# Patient Record
Sex: Female | Born: 1950
Health system: Southern US, Community
[De-identification: ages and names within clinical notes are randomized; demographics above are authoritative.]

## PROBLEM LIST (undated history)

## (undated) DIAGNOSIS — R0902 Hypoxemia: Secondary | ICD-10-CM

## (undated) DIAGNOSIS — L509 Urticaria, unspecified: Secondary | ICD-10-CM

## (undated) DIAGNOSIS — J45909 Unspecified asthma, uncomplicated: Secondary | ICD-10-CM

## (undated) DIAGNOSIS — K22710 Barrett's esophagus with low grade dysplasia: Secondary | ICD-10-CM

## (undated) DIAGNOSIS — E782 Mixed hyperlipidemia: Secondary | ICD-10-CM

## (undated) DIAGNOSIS — F418 Other specified anxiety disorders: Secondary | ICD-10-CM

## (undated) DIAGNOSIS — G4733 Obstructive sleep apnea (adult) (pediatric): Secondary | ICD-10-CM

## (undated) DIAGNOSIS — L650 Telogen effluvium: Secondary | ICD-10-CM

## (undated) DIAGNOSIS — J449 Chronic obstructive pulmonary disease, unspecified: Secondary | ICD-10-CM

## (undated) DIAGNOSIS — G4762 Sleep related leg cramps: Secondary | ICD-10-CM

## (undated) DIAGNOSIS — M8000XA Age-related osteoporosis with current pathological fracture, unspecified site, initial encounter for fracture: Secondary | ICD-10-CM

## (undated) DIAGNOSIS — F332 Major depressive disorder, recurrent severe without psychotic features: Secondary | ICD-10-CM

## (undated) DIAGNOSIS — I2699 Other pulmonary embolism without acute cor pulmonale: Secondary | ICD-10-CM

## (undated) DIAGNOSIS — K219 Gastro-esophageal reflux disease without esophagitis: Secondary | ICD-10-CM

## (undated) DIAGNOSIS — H269 Unspecified cataract: Secondary | ICD-10-CM

## (undated) DIAGNOSIS — E6609 Other obesity due to excess calories: Secondary | ICD-10-CM

## (undated) DIAGNOSIS — T7840XA Allergy, unspecified, initial encounter: Secondary | ICD-10-CM

## (undated) DIAGNOSIS — Z90710 Acquired absence of both cervix and uterus: Secondary | ICD-10-CM

## (undated) HISTORY — DX: Other specified anxiety disorders: F41.8

## (undated) HISTORY — DX: Allergy, unspecified, initial encounter: T78.40XA

## (undated) HISTORY — DX: Age-related osteoporosis with current pathological fracture, unspecified site, initial encounter for fracture: M80.00XA

## (undated) HISTORY — DX: Sleep related leg cramps: G47.62

## (undated) HISTORY — DX: Other obesity due to excess calories: E66.09

## (undated) HISTORY — DX: Barrett's esophagus with low grade dysplasia: K22.710

## (undated) HISTORY — DX: Mixed hyperlipidemia: E78.2

## (undated) HISTORY — DX: Gastro-esophageal reflux disease without esophagitis: K21.9

## (undated) HISTORY — DX: Chronic obstructive pulmonary disease, unspecified: J44.9

## (undated) HISTORY — PX: OTHER SURGICAL HISTORY: SHX169

## (undated) HISTORY — PX: EYE SURGERY: SHX253

## (undated) HISTORY — DX: Unspecified cataract: H26.9

## (undated) HISTORY — DX: Urticaria, unspecified: L50.9

## (undated) HISTORY — DX: Obstructive sleep apnea (adult) (pediatric): G47.33

## (undated) HISTORY — PX: SPINE SURGERY: SHX786

## (undated) HISTORY — DX: Telogen effluvium: L65.0

## (undated) HISTORY — DX: Other pulmonary embolism without acute cor pulmonale: I26.99

## (undated) HISTORY — DX: Major depressive disorder, recurrent severe without psychotic features: F33.2

## (undated) HISTORY — DX: Unspecified asthma, uncomplicated: J45.909

## (undated) HISTORY — DX: Acquired absence of both cervix and uterus: Z90.710

## (undated) HISTORY — DX: Hypoxemia: R09.02

---

## 1978-03-16 HISTORY — PX: ABDOMINAL HYSTERECTOMY: SHX81

## 1998-09-20 ENCOUNTER — Emergency Department (HOSPITAL_COMMUNITY): Admission: EM | Admit: 1998-09-20 | Discharge: 1998-09-20 | Payer: Self-pay | Admitting: Emergency Medicine

## 1998-09-22 ENCOUNTER — Emergency Department (HOSPITAL_COMMUNITY): Admission: EM | Admit: 1998-09-22 | Discharge: 1998-09-22 | Payer: Self-pay | Admitting: Emergency Medicine

## 1998-09-23 ENCOUNTER — Emergency Department (HOSPITAL_COMMUNITY): Admission: EM | Admit: 1998-09-23 | Discharge: 1998-09-23 | Payer: Self-pay | Admitting: Emergency Medicine

## 1998-09-24 ENCOUNTER — Emergency Department (HOSPITAL_COMMUNITY): Admission: EM | Admit: 1998-09-24 | Discharge: 1998-09-24 | Payer: Self-pay | Admitting: Emergency Medicine

## 2000-01-26 ENCOUNTER — Encounter: Payer: Self-pay | Admitting: Orthopedic Surgery

## 2000-01-26 ENCOUNTER — Ambulatory Visit (HOSPITAL_COMMUNITY): Admission: RE | Admit: 2000-01-26 | Discharge: 2000-01-26 | Payer: Self-pay | Admitting: Orthopedic Surgery

## 2015-04-25 DIAGNOSIS — F17210 Nicotine dependence, cigarettes, uncomplicated: Secondary | ICD-10-CM | POA: Diagnosis not present

## 2015-04-25 DIAGNOSIS — J440 Chronic obstructive pulmonary disease with acute lower respiratory infection: Secondary | ICD-10-CM | POA: Diagnosis not present

## 2015-06-17 DIAGNOSIS — N3000 Acute cystitis without hematuria: Secondary | ICD-10-CM | POA: Diagnosis not present

## 2015-06-17 DIAGNOSIS — N3001 Acute cystitis with hematuria: Secondary | ICD-10-CM | POA: Diagnosis not present

## 2015-07-17 DIAGNOSIS — G4733 Obstructive sleep apnea (adult) (pediatric): Secondary | ICD-10-CM | POA: Diagnosis not present

## 2015-07-17 DIAGNOSIS — J449 Chronic obstructive pulmonary disease, unspecified: Secondary | ICD-10-CM | POA: Diagnosis not present

## 2015-07-17 DIAGNOSIS — R5383 Other fatigue: Secondary | ICD-10-CM | POA: Diagnosis not present

## 2015-07-17 DIAGNOSIS — F17210 Nicotine dependence, cigarettes, uncomplicated: Secondary | ICD-10-CM | POA: Diagnosis not present

## 2015-07-26 DIAGNOSIS — E559 Vitamin D deficiency, unspecified: Secondary | ICD-10-CM | POA: Diagnosis not present

## 2015-08-23 DIAGNOSIS — G4733 Obstructive sleep apnea (adult) (pediatric): Secondary | ICD-10-CM | POA: Diagnosis not present

## 2015-08-30 DIAGNOSIS — G4733 Obstructive sleep apnea (adult) (pediatric): Secondary | ICD-10-CM | POA: Diagnosis not present

## 2015-08-30 DIAGNOSIS — J449 Chronic obstructive pulmonary disease, unspecified: Secondary | ICD-10-CM | POA: Diagnosis not present

## 2015-08-30 DIAGNOSIS — R5383 Other fatigue: Secondary | ICD-10-CM | POA: Diagnosis not present

## 2015-08-30 DIAGNOSIS — F17210 Nicotine dependence, cigarettes, uncomplicated: Secondary | ICD-10-CM | POA: Diagnosis not present

## 2015-12-06 DIAGNOSIS — G4733 Obstructive sleep apnea (adult) (pediatric): Secondary | ICD-10-CM | POA: Diagnosis not present

## 2015-12-09 DIAGNOSIS — J449 Chronic obstructive pulmonary disease, unspecified: Secondary | ICD-10-CM | POA: Diagnosis not present

## 2015-12-09 DIAGNOSIS — G4733 Obstructive sleep apnea (adult) (pediatric): Secondary | ICD-10-CM | POA: Diagnosis not present

## 2015-12-09 DIAGNOSIS — F17210 Nicotine dependence, cigarettes, uncomplicated: Secondary | ICD-10-CM | POA: Diagnosis not present

## 2015-12-09 DIAGNOSIS — R5383 Other fatigue: Secondary | ICD-10-CM | POA: Diagnosis not present

## 2015-12-22 DIAGNOSIS — G4733 Obstructive sleep apnea (adult) (pediatric): Secondary | ICD-10-CM | POA: Diagnosis not present

## 2015-12-22 DIAGNOSIS — Z23 Encounter for immunization: Secondary | ICD-10-CM | POA: Diagnosis not present

## 2015-12-30 DIAGNOSIS — F17210 Nicotine dependence, cigarettes, uncomplicated: Secondary | ICD-10-CM | POA: Diagnosis not present

## 2015-12-30 DIAGNOSIS — J449 Chronic obstructive pulmonary disease, unspecified: Secondary | ICD-10-CM | POA: Diagnosis not present

## 2015-12-30 DIAGNOSIS — G4733 Obstructive sleep apnea (adult) (pediatric): Secondary | ICD-10-CM | POA: Diagnosis not present

## 2015-12-30 DIAGNOSIS — R5383 Other fatigue: Secondary | ICD-10-CM | POA: Diagnosis not present

## 2016-01-02 DIAGNOSIS — Z1231 Encounter for screening mammogram for malignant neoplasm of breast: Secondary | ICD-10-CM | POA: Diagnosis not present

## 2016-01-02 DIAGNOSIS — M25512 Pain in left shoulder: Secondary | ICD-10-CM | POA: Diagnosis not present

## 2016-01-23 DIAGNOSIS — Z1231 Encounter for screening mammogram for malignant neoplasm of breast: Secondary | ICD-10-CM | POA: Diagnosis not present

## 2016-02-03 DIAGNOSIS — G4733 Obstructive sleep apnea (adult) (pediatric): Secondary | ICD-10-CM | POA: Diagnosis not present

## 2016-02-03 DIAGNOSIS — R5383 Other fatigue: Secondary | ICD-10-CM | POA: Diagnosis not present

## 2016-02-03 DIAGNOSIS — J449 Chronic obstructive pulmonary disease, unspecified: Secondary | ICD-10-CM | POA: Diagnosis not present

## 2016-04-06 DIAGNOSIS — J440 Chronic obstructive pulmonary disease with acute lower respiratory infection: Secondary | ICD-10-CM | POA: Diagnosis not present

## 2016-04-06 DIAGNOSIS — F17210 Nicotine dependence, cigarettes, uncomplicated: Secondary | ICD-10-CM | POA: Diagnosis not present

## 2016-04-08 DIAGNOSIS — G4733 Obstructive sleep apnea (adult) (pediatric): Secondary | ICD-10-CM | POA: Diagnosis not present

## 2016-04-08 DIAGNOSIS — R5383 Other fatigue: Secondary | ICD-10-CM | POA: Diagnosis not present

## 2016-04-08 DIAGNOSIS — Z87891 Personal history of nicotine dependence: Secondary | ICD-10-CM | POA: Diagnosis not present

## 2016-04-08 DIAGNOSIS — J449 Chronic obstructive pulmonary disease, unspecified: Secondary | ICD-10-CM | POA: Diagnosis not present

## 2016-05-08 DIAGNOSIS — J440 Chronic obstructive pulmonary disease with acute lower respiratory infection: Secondary | ICD-10-CM | POA: Diagnosis not present

## 2016-05-08 DIAGNOSIS — F411 Generalized anxiety disorder: Secondary | ICD-10-CM | POA: Diagnosis not present

## 2016-05-08 DIAGNOSIS — F17210 Nicotine dependence, cigarettes, uncomplicated: Secondary | ICD-10-CM | POA: Diagnosis not present

## 2016-06-10 DIAGNOSIS — G4733 Obstructive sleep apnea (adult) (pediatric): Secondary | ICD-10-CM | POA: Diagnosis not present

## 2016-06-15 DIAGNOSIS — R05 Cough: Secondary | ICD-10-CM | POA: Diagnosis not present

## 2016-06-15 DIAGNOSIS — J449 Chronic obstructive pulmonary disease, unspecified: Secondary | ICD-10-CM | POA: Diagnosis not present

## 2016-06-15 DIAGNOSIS — G4733 Obstructive sleep apnea (adult) (pediatric): Secondary | ICD-10-CM | POA: Diagnosis not present

## 2016-06-15 DIAGNOSIS — R5383 Other fatigue: Secondary | ICD-10-CM | POA: Diagnosis not present

## 2016-06-17 DIAGNOSIS — G4762 Sleep related leg cramps: Secondary | ICD-10-CM | POA: Diagnosis not present

## 2016-07-20 DIAGNOSIS — J441 Chronic obstructive pulmonary disease with (acute) exacerbation: Secondary | ICD-10-CM | POA: Diagnosis not present

## 2016-08-05 DIAGNOSIS — S300XXA Contusion of lower back and pelvis, initial encounter: Secondary | ICD-10-CM | POA: Diagnosis not present

## 2016-09-15 DIAGNOSIS — M7022 Olecranon bursitis, left elbow: Secondary | ICD-10-CM | POA: Diagnosis not present

## 2016-10-02 ENCOUNTER — Other Ambulatory Visit: Payer: Self-pay

## 2016-10-09 DIAGNOSIS — R5383 Other fatigue: Secondary | ICD-10-CM | POA: Diagnosis not present

## 2016-10-09 DIAGNOSIS — R600 Localized edema: Secondary | ICD-10-CM | POA: Diagnosis not present

## 2016-10-22 DIAGNOSIS — G4733 Obstructive sleep apnea (adult) (pediatric): Secondary | ICD-10-CM | POA: Diagnosis not present

## 2016-11-04 DIAGNOSIS — G4733 Obstructive sleep apnea (adult) (pediatric): Secondary | ICD-10-CM | POA: Diagnosis not present

## 2016-11-04 DIAGNOSIS — J449 Chronic obstructive pulmonary disease, unspecified: Secondary | ICD-10-CM | POA: Diagnosis not present

## 2016-11-04 DIAGNOSIS — R5383 Other fatigue: Secondary | ICD-10-CM | POA: Diagnosis not present

## 2016-11-18 DIAGNOSIS — Z87891 Personal history of nicotine dependence: Secondary | ICD-10-CM | POA: Diagnosis not present

## 2016-11-18 DIAGNOSIS — R5383 Other fatigue: Secondary | ICD-10-CM | POA: Diagnosis not present

## 2016-11-18 DIAGNOSIS — G4733 Obstructive sleep apnea (adult) (pediatric): Secondary | ICD-10-CM | POA: Diagnosis not present

## 2016-11-18 DIAGNOSIS — J449 Chronic obstructive pulmonary disease, unspecified: Secondary | ICD-10-CM | POA: Diagnosis not present

## 2016-12-11 DIAGNOSIS — M7022 Olecranon bursitis, left elbow: Secondary | ICD-10-CM | POA: Diagnosis not present

## 2016-12-11 DIAGNOSIS — M542 Cervicalgia: Secondary | ICD-10-CM | POA: Diagnosis not present

## 2017-01-05 DIAGNOSIS — Z23 Encounter for immunization: Secondary | ICD-10-CM | POA: Diagnosis not present

## 2017-01-05 DIAGNOSIS — M7022 Olecranon bursitis, left elbow: Secondary | ICD-10-CM | POA: Diagnosis not present

## 2017-01-28 DIAGNOSIS — G4762 Sleep related leg cramps: Secondary | ICD-10-CM | POA: Diagnosis not present

## 2017-01-28 DIAGNOSIS — F411 Generalized anxiety disorder: Secondary | ICD-10-CM | POA: Diagnosis not present

## 2017-01-28 DIAGNOSIS — F418 Other specified anxiety disorders: Secondary | ICD-10-CM | POA: Diagnosis not present

## 2017-02-23 DIAGNOSIS — G4733 Obstructive sleep apnea (adult) (pediatric): Secondary | ICD-10-CM | POA: Diagnosis not present

## 2017-03-22 DIAGNOSIS — G4733 Obstructive sleep apnea (adult) (pediatric): Secondary | ICD-10-CM | POA: Diagnosis not present

## 2017-03-24 DIAGNOSIS — J441 Chronic obstructive pulmonary disease with (acute) exacerbation: Secondary | ICD-10-CM | POA: Diagnosis not present

## 2017-03-24 DIAGNOSIS — G4733 Obstructive sleep apnea (adult) (pediatric): Secondary | ICD-10-CM | POA: Diagnosis not present

## 2017-03-24 DIAGNOSIS — R5383 Other fatigue: Secondary | ICD-10-CM | POA: Diagnosis not present

## 2017-03-24 DIAGNOSIS — F17210 Nicotine dependence, cigarettes, uncomplicated: Secondary | ICD-10-CM | POA: Diagnosis not present

## 2017-04-22 DIAGNOSIS — J441 Chronic obstructive pulmonary disease with (acute) exacerbation: Secondary | ICD-10-CM | POA: Diagnosis not present

## 2017-04-27 DIAGNOSIS — J441 Chronic obstructive pulmonary disease with (acute) exacerbation: Secondary | ICD-10-CM | POA: Diagnosis not present

## 2017-05-10 DIAGNOSIS — E782 Mixed hyperlipidemia: Secondary | ICD-10-CM | POA: Diagnosis not present

## 2017-05-10 DIAGNOSIS — F418 Other specified anxiety disorders: Secondary | ICD-10-CM | POA: Diagnosis not present

## 2017-05-10 DIAGNOSIS — J449 Chronic obstructive pulmonary disease, unspecified: Secondary | ICD-10-CM | POA: Diagnosis not present

## 2017-05-10 DIAGNOSIS — G4762 Sleep related leg cramps: Secondary | ICD-10-CM | POA: Diagnosis not present

## 2017-06-30 DIAGNOSIS — Z23 Encounter for immunization: Secondary | ICD-10-CM | POA: Diagnosis not present

## 2017-06-30 DIAGNOSIS — L03116 Cellulitis of left lower limb: Secondary | ICD-10-CM | POA: Diagnosis not present

## 2017-06-30 DIAGNOSIS — S81812A Laceration without foreign body, left lower leg, initial encounter: Secondary | ICD-10-CM | POA: Diagnosis not present

## 2017-07-05 DIAGNOSIS — L03116 Cellulitis of left lower limb: Secondary | ICD-10-CM | POA: Diagnosis not present

## 2017-07-05 DIAGNOSIS — S81812D Laceration without foreign body, left lower leg, subsequent encounter: Secondary | ICD-10-CM | POA: Diagnosis not present

## 2017-07-16 DIAGNOSIS — S81812D Laceration without foreign body, left lower leg, subsequent encounter: Secondary | ICD-10-CM | POA: Diagnosis not present

## 2017-07-16 DIAGNOSIS — L03116 Cellulitis of left lower limb: Secondary | ICD-10-CM | POA: Diagnosis not present

## 2017-07-19 DIAGNOSIS — G4730 Sleep apnea, unspecified: Secondary | ICD-10-CM | POA: Diagnosis not present

## 2017-07-19 DIAGNOSIS — L97822 Non-pressure chronic ulcer of other part of left lower leg with fat layer exposed: Secondary | ICD-10-CM | POA: Diagnosis not present

## 2017-07-19 DIAGNOSIS — S81802A Unspecified open wound, left lower leg, initial encounter: Secondary | ICD-10-CM | POA: Diagnosis not present

## 2017-07-19 DIAGNOSIS — F40240 Claustrophobia: Secondary | ICD-10-CM | POA: Diagnosis not present

## 2017-07-19 DIAGNOSIS — F17210 Nicotine dependence, cigarettes, uncomplicated: Secondary | ICD-10-CM | POA: Diagnosis not present

## 2017-07-19 DIAGNOSIS — J449 Chronic obstructive pulmonary disease, unspecified: Secondary | ICD-10-CM | POA: Diagnosis not present

## 2017-07-26 DIAGNOSIS — S81852A Open bite, left lower leg, initial encounter: Secondary | ICD-10-CM | POA: Diagnosis not present

## 2017-07-26 DIAGNOSIS — S80872A Other superficial bite, left lower leg, initial encounter: Secondary | ICD-10-CM | POA: Diagnosis not present

## 2017-07-28 DIAGNOSIS — S81802A Unspecified open wound, left lower leg, initial encounter: Secondary | ICD-10-CM | POA: Diagnosis not present

## 2017-07-28 DIAGNOSIS — Z872 Personal history of diseases of the skin and subcutaneous tissue: Secondary | ICD-10-CM | POA: Diagnosis not present

## 2017-07-29 DIAGNOSIS — S81802A Unspecified open wound, left lower leg, initial encounter: Secondary | ICD-10-CM | POA: Diagnosis not present

## 2017-07-29 DIAGNOSIS — I83892 Varicose veins of left lower extremities with other complications: Secondary | ICD-10-CM | POA: Diagnosis not present

## 2017-08-11 DIAGNOSIS — S81802A Unspecified open wound, left lower leg, initial encounter: Secondary | ICD-10-CM | POA: Diagnosis not present

## 2017-08-11 DIAGNOSIS — S81852A Open bite, left lower leg, initial encounter: Secondary | ICD-10-CM | POA: Diagnosis not present

## 2017-08-19 DIAGNOSIS — S81802A Unspecified open wound, left lower leg, initial encounter: Secondary | ICD-10-CM | POA: Diagnosis not present

## 2017-08-19 DIAGNOSIS — L97822 Non-pressure chronic ulcer of other part of left lower leg with fat layer exposed: Secondary | ICD-10-CM | POA: Diagnosis not present

## 2017-08-26 DIAGNOSIS — S81852A Open bite, left lower leg, initial encounter: Secondary | ICD-10-CM | POA: Diagnosis not present

## 2017-08-26 DIAGNOSIS — S80812A Abrasion, left lower leg, initial encounter: Secondary | ICD-10-CM | POA: Diagnosis not present

## 2017-09-02 DIAGNOSIS — S81852A Open bite, left lower leg, initial encounter: Secondary | ICD-10-CM | POA: Diagnosis not present

## 2017-09-02 DIAGNOSIS — F418 Other specified anxiety disorders: Secondary | ICD-10-CM | POA: Diagnosis not present

## 2017-09-02 DIAGNOSIS — Z1211 Encounter for screening for malignant neoplasm of colon: Secondary | ICD-10-CM | POA: Diagnosis not present

## 2017-09-02 DIAGNOSIS — S81802A Unspecified open wound, left lower leg, initial encounter: Secondary | ICD-10-CM | POA: Diagnosis not present

## 2017-09-02 DIAGNOSIS — G4733 Obstructive sleep apnea (adult) (pediatric): Secondary | ICD-10-CM | POA: Diagnosis not present

## 2017-09-02 DIAGNOSIS — E782 Mixed hyperlipidemia: Secondary | ICD-10-CM | POA: Diagnosis not present

## 2017-09-02 DIAGNOSIS — J449 Chronic obstructive pulmonary disease, unspecified: Secondary | ICD-10-CM | POA: Diagnosis not present

## 2017-09-09 DIAGNOSIS — S81852A Open bite, left lower leg, initial encounter: Secondary | ICD-10-CM | POA: Diagnosis not present

## 2017-09-09 DIAGNOSIS — I87303 Chronic venous hypertension (idiopathic) without complications of bilateral lower extremity: Secondary | ICD-10-CM | POA: Diagnosis not present

## 2017-09-09 DIAGNOSIS — S81802A Unspecified open wound, left lower leg, initial encounter: Secondary | ICD-10-CM | POA: Diagnosis not present

## 2017-09-13 DIAGNOSIS — Z1211 Encounter for screening for malignant neoplasm of colon: Secondary | ICD-10-CM | POA: Diagnosis not present

## 2017-09-13 DIAGNOSIS — S81802A Unspecified open wound, left lower leg, initial encounter: Secondary | ICD-10-CM | POA: Diagnosis not present

## 2017-09-13 DIAGNOSIS — S80812A Abrasion, left lower leg, initial encounter: Secondary | ICD-10-CM | POA: Diagnosis not present

## 2017-09-13 DIAGNOSIS — I87303 Chronic venous hypertension (idiopathic) without complications of bilateral lower extremity: Secondary | ICD-10-CM | POA: Diagnosis not present

## 2017-09-13 LAB — COLOGUARD: Cologuard: NEGATIVE

## 2017-09-20 DIAGNOSIS — S80812A Abrasion, left lower leg, initial encounter: Secondary | ICD-10-CM | POA: Diagnosis not present

## 2017-09-20 DIAGNOSIS — S81852A Open bite, left lower leg, initial encounter: Secondary | ICD-10-CM | POA: Diagnosis not present

## 2017-09-20 DIAGNOSIS — I87303 Chronic venous hypertension (idiopathic) without complications of bilateral lower extremity: Secondary | ICD-10-CM | POA: Diagnosis not present

## 2017-09-27 DIAGNOSIS — S81802A Unspecified open wound, left lower leg, initial encounter: Secondary | ICD-10-CM | POA: Diagnosis not present

## 2017-09-27 DIAGNOSIS — L97822 Non-pressure chronic ulcer of other part of left lower leg with fat layer exposed: Secondary | ICD-10-CM | POA: Diagnosis not present

## 2017-10-04 DIAGNOSIS — L97822 Non-pressure chronic ulcer of other part of left lower leg with fat layer exposed: Secondary | ICD-10-CM | POA: Diagnosis not present

## 2017-10-04 DIAGNOSIS — S81802A Unspecified open wound, left lower leg, initial encounter: Secondary | ICD-10-CM | POA: Diagnosis not present

## 2017-10-11 DIAGNOSIS — S81802A Unspecified open wound, left lower leg, initial encounter: Secondary | ICD-10-CM | POA: Diagnosis not present

## 2017-10-13 DIAGNOSIS — R1013 Epigastric pain: Secondary | ICD-10-CM | POA: Diagnosis not present

## 2017-10-18 DIAGNOSIS — S81852A Open bite, left lower leg, initial encounter: Secondary | ICD-10-CM | POA: Diagnosis not present

## 2017-10-18 DIAGNOSIS — S81802A Unspecified open wound, left lower leg, initial encounter: Secondary | ICD-10-CM | POA: Diagnosis not present

## 2017-10-21 DIAGNOSIS — G4733 Obstructive sleep apnea (adult) (pediatric): Secondary | ICD-10-CM | POA: Diagnosis not present

## 2017-10-22 DIAGNOSIS — F17210 Nicotine dependence, cigarettes, uncomplicated: Secondary | ICD-10-CM | POA: Diagnosis not present

## 2017-10-22 DIAGNOSIS — J449 Chronic obstructive pulmonary disease, unspecified: Secondary | ICD-10-CM | POA: Diagnosis not present

## 2017-10-22 DIAGNOSIS — G4733 Obstructive sleep apnea (adult) (pediatric): Secondary | ICD-10-CM | POA: Diagnosis not present

## 2017-10-25 DIAGNOSIS — S81802A Unspecified open wound, left lower leg, initial encounter: Secondary | ICD-10-CM | POA: Diagnosis not present

## 2017-10-25 DIAGNOSIS — S81852A Open bite, left lower leg, initial encounter: Secondary | ICD-10-CM | POA: Diagnosis not present

## 2017-10-28 DIAGNOSIS — R1013 Epigastric pain: Secondary | ICD-10-CM | POA: Diagnosis not present

## 2017-11-01 DIAGNOSIS — S81802A Unspecified open wound, left lower leg, initial encounter: Secondary | ICD-10-CM | POA: Diagnosis not present

## 2017-11-01 DIAGNOSIS — S81852A Open bite, left lower leg, initial encounter: Secondary | ICD-10-CM | POA: Diagnosis not present

## 2017-11-08 DIAGNOSIS — S81852A Open bite, left lower leg, initial encounter: Secondary | ICD-10-CM | POA: Diagnosis not present

## 2017-11-08 DIAGNOSIS — S81802A Unspecified open wound, left lower leg, initial encounter: Secondary | ICD-10-CM | POA: Diagnosis not present

## 2017-11-16 DIAGNOSIS — Z87828 Personal history of other (healed) physical injury and trauma: Secondary | ICD-10-CM | POA: Diagnosis not present

## 2017-11-16 DIAGNOSIS — S81852A Open bite, left lower leg, initial encounter: Secondary | ICD-10-CM | POA: Diagnosis not present

## 2017-11-16 DIAGNOSIS — Z09 Encounter for follow-up examination after completed treatment for conditions other than malignant neoplasm: Secondary | ICD-10-CM | POA: Diagnosis not present

## 2017-11-17 DIAGNOSIS — R1013 Epigastric pain: Secondary | ICD-10-CM | POA: Diagnosis not present

## 2017-11-17 DIAGNOSIS — F418 Other specified anxiety disorders: Secondary | ICD-10-CM | POA: Diagnosis not present

## 2017-11-22 DIAGNOSIS — K219 Gastro-esophageal reflux disease without esophagitis: Secondary | ICD-10-CM | POA: Diagnosis not present

## 2017-11-22 DIAGNOSIS — R1013 Epigastric pain: Secondary | ICD-10-CM | POA: Diagnosis not present

## 2017-12-17 DIAGNOSIS — Z79899 Other long term (current) drug therapy: Secondary | ICD-10-CM | POA: Diagnosis not present

## 2017-12-17 DIAGNOSIS — R110 Nausea: Secondary | ICD-10-CM | POA: Diagnosis not present

## 2017-12-17 DIAGNOSIS — K219 Gastro-esophageal reflux disease without esophagitis: Secondary | ICD-10-CM | POA: Diagnosis not present

## 2017-12-17 DIAGNOSIS — J449 Chronic obstructive pulmonary disease, unspecified: Secondary | ICD-10-CM | POA: Diagnosis not present

## 2017-12-17 DIAGNOSIS — F17210 Nicotine dependence, cigarettes, uncomplicated: Secondary | ICD-10-CM | POA: Diagnosis not present

## 2017-12-17 DIAGNOSIS — K449 Diaphragmatic hernia without obstruction or gangrene: Secondary | ICD-10-CM | POA: Diagnosis not present

## 2017-12-17 DIAGNOSIS — F329 Major depressive disorder, single episode, unspecified: Secondary | ICD-10-CM | POA: Diagnosis not present

## 2017-12-17 DIAGNOSIS — G4730 Sleep apnea, unspecified: Secondary | ICD-10-CM | POA: Diagnosis not present

## 2017-12-17 DIAGNOSIS — K2970 Gastritis, unspecified, without bleeding: Secondary | ICD-10-CM | POA: Diagnosis not present

## 2017-12-17 DIAGNOSIS — K2270 Barrett's esophagus without dysplasia: Secondary | ICD-10-CM | POA: Diagnosis not present

## 2017-12-17 DIAGNOSIS — K7681 Hepatopulmonary syndrome: Secondary | ICD-10-CM | POA: Diagnosis not present

## 2017-12-17 DIAGNOSIS — K2950 Unspecified chronic gastritis without bleeding: Secondary | ICD-10-CM | POA: Diagnosis not present

## 2017-12-23 DIAGNOSIS — Z23 Encounter for immunization: Secondary | ICD-10-CM | POA: Diagnosis not present

## 2018-01-17 DIAGNOSIS — R110 Nausea: Secondary | ICD-10-CM | POA: Diagnosis not present

## 2018-01-17 DIAGNOSIS — K7681 Hepatopulmonary syndrome: Secondary | ICD-10-CM | POA: Diagnosis not present

## 2018-01-17 DIAGNOSIS — K219 Gastro-esophageal reflux disease without esophagitis: Secondary | ICD-10-CM | POA: Diagnosis not present

## 2018-01-31 ENCOUNTER — Other Ambulatory Visit: Payer: Self-pay

## 2018-02-15 DIAGNOSIS — F17210 Nicotine dependence, cigarettes, uncomplicated: Secondary | ICD-10-CM | POA: Diagnosis not present

## 2018-02-15 DIAGNOSIS — G4733 Obstructive sleep apnea (adult) (pediatric): Secondary | ICD-10-CM | POA: Diagnosis not present

## 2018-02-15 DIAGNOSIS — J4520 Mild intermittent asthma, uncomplicated: Secondary | ICD-10-CM | POA: Diagnosis not present

## 2018-02-23 DIAGNOSIS — R600 Localized edema: Secondary | ICD-10-CM | POA: Diagnosis not present

## 2018-03-01 DIAGNOSIS — R600 Localized edema: Secondary | ICD-10-CM | POA: Diagnosis not present

## 2018-03-01 DIAGNOSIS — R0602 Shortness of breath: Secondary | ICD-10-CM | POA: Diagnosis not present

## 2018-03-04 DIAGNOSIS — J449 Chronic obstructive pulmonary disease, unspecified: Secondary | ICD-10-CM | POA: Diagnosis not present

## 2018-03-04 DIAGNOSIS — E782 Mixed hyperlipidemia: Secondary | ICD-10-CM | POA: Diagnosis not present

## 2018-03-04 DIAGNOSIS — S39011A Strain of muscle, fascia and tendon of abdomen, initial encounter: Secondary | ICD-10-CM | POA: Diagnosis not present

## 2018-03-04 DIAGNOSIS — G4733 Obstructive sleep apnea (adult) (pediatric): Secondary | ICD-10-CM | POA: Diagnosis not present

## 2018-03-04 DIAGNOSIS — R600 Localized edema: Secondary | ICD-10-CM | POA: Diagnosis not present

## 2018-03-04 DIAGNOSIS — F418 Other specified anxiety disorders: Secondary | ICD-10-CM | POA: Diagnosis not present

## 2018-03-23 DIAGNOSIS — E785 Hyperlipidemia, unspecified: Secondary | ICD-10-CM | POA: Diagnosis not present

## 2018-03-23 DIAGNOSIS — Z87891 Personal history of nicotine dependence: Secondary | ICD-10-CM | POA: Insufficient documentation

## 2018-03-23 DIAGNOSIS — R609 Edema, unspecified: Secondary | ICD-10-CM | POA: Diagnosis not present

## 2018-03-23 DIAGNOSIS — R002 Palpitations: Secondary | ICD-10-CM | POA: Diagnosis not present

## 2018-03-23 DIAGNOSIS — J449 Chronic obstructive pulmonary disease, unspecified: Secondary | ICD-10-CM | POA: Diagnosis not present

## 2018-03-23 DIAGNOSIS — R0789 Other chest pain: Secondary | ICD-10-CM | POA: Diagnosis not present

## 2018-03-29 DIAGNOSIS — R0789 Other chest pain: Secondary | ICD-10-CM | POA: Diagnosis not present

## 2018-04-01 DIAGNOSIS — G4733 Obstructive sleep apnea (adult) (pediatric): Secondary | ICD-10-CM | POA: Diagnosis not present

## 2018-04-07 DIAGNOSIS — J449 Chronic obstructive pulmonary disease, unspecified: Secondary | ICD-10-CM | POA: Diagnosis not present

## 2018-04-12 DIAGNOSIS — R69 Illness, unspecified: Secondary | ICD-10-CM | POA: Diagnosis not present

## 2018-04-12 DIAGNOSIS — R05 Cough: Secondary | ICD-10-CM | POA: Diagnosis not present

## 2018-04-12 DIAGNOSIS — J189 Pneumonia, unspecified organism: Secondary | ICD-10-CM | POA: Diagnosis not present

## 2018-04-12 DIAGNOSIS — G4733 Obstructive sleep apnea (adult) (pediatric): Secondary | ICD-10-CM | POA: Diagnosis not present

## 2018-04-12 DIAGNOSIS — J4520 Mild intermittent asthma, uncomplicated: Secondary | ICD-10-CM | POA: Diagnosis not present

## 2018-04-13 DIAGNOSIS — R799 Abnormal finding of blood chemistry, unspecified: Secondary | ICD-10-CM | POA: Diagnosis not present

## 2018-04-13 DIAGNOSIS — J189 Pneumonia, unspecified organism: Secondary | ICD-10-CM | POA: Diagnosis not present

## 2018-04-22 DIAGNOSIS — G4733 Obstructive sleep apnea (adult) (pediatric): Secondary | ICD-10-CM | POA: Diagnosis not present

## 2018-04-25 DIAGNOSIS — R05 Cough: Secondary | ICD-10-CM | POA: Diagnosis not present

## 2018-04-25 DIAGNOSIS — R69 Illness, unspecified: Secondary | ICD-10-CM | POA: Diagnosis not present

## 2018-04-25 DIAGNOSIS — J4520 Mild intermittent asthma, uncomplicated: Secondary | ICD-10-CM | POA: Diagnosis not present

## 2018-04-25 DIAGNOSIS — G4733 Obstructive sleep apnea (adult) (pediatric): Secondary | ICD-10-CM | POA: Diagnosis not present

## 2018-05-04 DIAGNOSIS — R609 Edema, unspecified: Secondary | ICD-10-CM | POA: Diagnosis not present

## 2018-05-08 DIAGNOSIS — J449 Chronic obstructive pulmonary disease, unspecified: Secondary | ICD-10-CM | POA: Diagnosis not present

## 2018-05-30 DIAGNOSIS — I8390 Asymptomatic varicose veins of unspecified lower extremity: Secondary | ICD-10-CM | POA: Diagnosis not present

## 2018-05-30 DIAGNOSIS — J309 Allergic rhinitis, unspecified: Secondary | ICD-10-CM | POA: Diagnosis not present

## 2018-05-30 DIAGNOSIS — E785 Hyperlipidemia, unspecified: Secondary | ICD-10-CM | POA: Diagnosis not present

## 2018-05-30 DIAGNOSIS — J449 Chronic obstructive pulmonary disease, unspecified: Secondary | ICD-10-CM | POA: Diagnosis not present

## 2018-05-30 DIAGNOSIS — R69 Illness, unspecified: Secondary | ICD-10-CM | POA: Diagnosis not present

## 2018-05-30 DIAGNOSIS — G4730 Sleep apnea, unspecified: Secondary | ICD-10-CM | POA: Diagnosis not present

## 2018-05-30 DIAGNOSIS — G4700 Insomnia, unspecified: Secondary | ICD-10-CM | POA: Diagnosis not present

## 2018-05-30 DIAGNOSIS — G2581 Restless legs syndrome: Secondary | ICD-10-CM | POA: Diagnosis not present

## 2018-05-30 DIAGNOSIS — G8929 Other chronic pain: Secondary | ICD-10-CM | POA: Diagnosis not present

## 2018-06-01 DIAGNOSIS — Z0000 Encounter for general adult medical examination without abnormal findings: Secondary | ICD-10-CM | POA: Diagnosis not present

## 2018-06-01 DIAGNOSIS — E782 Mixed hyperlipidemia: Secondary | ICD-10-CM | POA: Diagnosis not present

## 2018-06-01 DIAGNOSIS — Z6830 Body mass index (BMI) 30.0-30.9, adult: Secondary | ICD-10-CM | POA: Diagnosis not present

## 2018-06-01 DIAGNOSIS — Z1231 Encounter for screening mammogram for malignant neoplasm of breast: Secondary | ICD-10-CM | POA: Diagnosis not present

## 2018-06-02 DIAGNOSIS — R7301 Impaired fasting glucose: Secondary | ICD-10-CM | POA: Diagnosis not present

## 2018-06-06 DIAGNOSIS — J449 Chronic obstructive pulmonary disease, unspecified: Secondary | ICD-10-CM | POA: Diagnosis not present

## 2018-06-13 DIAGNOSIS — J4520 Mild intermittent asthma, uncomplicated: Secondary | ICD-10-CM | POA: Diagnosis not present

## 2018-06-13 DIAGNOSIS — J479 Bronchiectasis, uncomplicated: Secondary | ICD-10-CM | POA: Diagnosis not present

## 2018-06-13 DIAGNOSIS — G4733 Obstructive sleep apnea (adult) (pediatric): Secondary | ICD-10-CM | POA: Diagnosis not present

## 2018-07-01 DIAGNOSIS — J4531 Mild persistent asthma with (acute) exacerbation: Secondary | ICD-10-CM | POA: Diagnosis not present

## 2018-07-01 DIAGNOSIS — G4733 Obstructive sleep apnea (adult) (pediatric): Secondary | ICD-10-CM | POA: Diagnosis not present

## 2018-07-01 DIAGNOSIS — J9621 Acute and chronic respiratory failure with hypoxia: Secondary | ICD-10-CM | POA: Diagnosis not present

## 2018-07-01 DIAGNOSIS — R5383 Other fatigue: Secondary | ICD-10-CM | POA: Diagnosis not present

## 2018-07-01 DIAGNOSIS — R05 Cough: Secondary | ICD-10-CM | POA: Diagnosis not present

## 2018-07-01 DIAGNOSIS — R0600 Dyspnea, unspecified: Secondary | ICD-10-CM | POA: Diagnosis not present

## 2018-07-04 DIAGNOSIS — J4531 Mild persistent asthma with (acute) exacerbation: Secondary | ICD-10-CM | POA: Diagnosis not present

## 2018-07-04 DIAGNOSIS — Z87891 Personal history of nicotine dependence: Secondary | ICD-10-CM | POA: Diagnosis not present

## 2018-07-04 DIAGNOSIS — R0600 Dyspnea, unspecified: Secondary | ICD-10-CM | POA: Diagnosis not present

## 2018-07-04 DIAGNOSIS — R5383 Other fatigue: Secondary | ICD-10-CM | POA: Diagnosis not present

## 2018-07-04 DIAGNOSIS — J9621 Acute and chronic respiratory failure with hypoxia: Secondary | ICD-10-CM | POA: Diagnosis not present

## 2018-07-04 DIAGNOSIS — R05 Cough: Secondary | ICD-10-CM | POA: Diagnosis not present

## 2018-07-04 DIAGNOSIS — G4733 Obstructive sleep apnea (adult) (pediatric): Secondary | ICD-10-CM | POA: Diagnosis not present

## 2018-07-05 DIAGNOSIS — J4531 Mild persistent asthma with (acute) exacerbation: Secondary | ICD-10-CM | POA: Diagnosis not present

## 2018-07-05 DIAGNOSIS — R05 Cough: Secondary | ICD-10-CM | POA: Diagnosis not present

## 2018-07-05 DIAGNOSIS — G4733 Obstructive sleep apnea (adult) (pediatric): Secondary | ICD-10-CM | POA: Diagnosis not present

## 2018-07-05 DIAGNOSIS — R5383 Other fatigue: Secondary | ICD-10-CM | POA: Diagnosis not present

## 2018-07-05 DIAGNOSIS — R0600 Dyspnea, unspecified: Secondary | ICD-10-CM | POA: Diagnosis not present

## 2018-07-11 DIAGNOSIS — M25552 Pain in left hip: Secondary | ICD-10-CM | POA: Diagnosis not present

## 2018-07-12 DIAGNOSIS — J4530 Mild persistent asthma, uncomplicated: Secondary | ICD-10-CM | POA: Diagnosis not present

## 2018-07-12 DIAGNOSIS — G4733 Obstructive sleep apnea (adult) (pediatric): Secondary | ICD-10-CM | POA: Diagnosis not present

## 2018-07-12 DIAGNOSIS — J189 Pneumonia, unspecified organism: Secondary | ICD-10-CM | POA: Diagnosis not present

## 2018-07-12 DIAGNOSIS — J9621 Acute and chronic respiratory failure with hypoxia: Secondary | ICD-10-CM | POA: Diagnosis not present

## 2018-07-12 DIAGNOSIS — R0600 Dyspnea, unspecified: Secondary | ICD-10-CM | POA: Diagnosis not present

## 2018-07-12 DIAGNOSIS — R5383 Other fatigue: Secondary | ICD-10-CM | POA: Diagnosis not present

## 2018-07-12 DIAGNOSIS — R05 Cough: Secondary | ICD-10-CM | POA: Diagnosis not present

## 2018-07-13 DIAGNOSIS — J4530 Mild persistent asthma, uncomplicated: Secondary | ICD-10-CM | POA: Diagnosis not present

## 2018-07-13 DIAGNOSIS — R0600 Dyspnea, unspecified: Secondary | ICD-10-CM | POA: Diagnosis not present

## 2018-07-13 DIAGNOSIS — G4733 Obstructive sleep apnea (adult) (pediatric): Secondary | ICD-10-CM | POA: Diagnosis not present

## 2018-07-13 DIAGNOSIS — J9621 Acute and chronic respiratory failure with hypoxia: Secondary | ICD-10-CM | POA: Diagnosis not present

## 2018-07-13 DIAGNOSIS — J189 Pneumonia, unspecified organism: Secondary | ICD-10-CM | POA: Diagnosis not present

## 2018-07-13 DIAGNOSIS — R5383 Other fatigue: Secondary | ICD-10-CM | POA: Diagnosis not present

## 2018-07-13 DIAGNOSIS — R05 Cough: Secondary | ICD-10-CM | POA: Diagnosis not present

## 2018-07-14 DIAGNOSIS — J4530 Mild persistent asthma, uncomplicated: Secondary | ICD-10-CM | POA: Diagnosis not present

## 2018-07-14 DIAGNOSIS — J9621 Acute and chronic respiratory failure with hypoxia: Secondary | ICD-10-CM | POA: Diagnosis not present

## 2018-07-14 DIAGNOSIS — R0600 Dyspnea, unspecified: Secondary | ICD-10-CM | POA: Diagnosis not present

## 2018-07-14 DIAGNOSIS — G4733 Obstructive sleep apnea (adult) (pediatric): Secondary | ICD-10-CM | POA: Diagnosis not present

## 2018-07-14 DIAGNOSIS — R05 Cough: Secondary | ICD-10-CM | POA: Diagnosis not present

## 2018-07-14 DIAGNOSIS — R5383 Other fatigue: Secondary | ICD-10-CM | POA: Diagnosis not present

## 2018-07-14 DIAGNOSIS — J189 Pneumonia, unspecified organism: Secondary | ICD-10-CM | POA: Diagnosis not present

## 2018-07-15 DIAGNOSIS — R0600 Dyspnea, unspecified: Secondary | ICD-10-CM | POA: Diagnosis not present

## 2018-07-15 DIAGNOSIS — R5383 Other fatigue: Secondary | ICD-10-CM | POA: Diagnosis not present

## 2018-07-15 DIAGNOSIS — R05 Cough: Secondary | ICD-10-CM | POA: Diagnosis not present

## 2018-07-15 DIAGNOSIS — G4733 Obstructive sleep apnea (adult) (pediatric): Secondary | ICD-10-CM | POA: Diagnosis not present

## 2018-07-15 DIAGNOSIS — J4530 Mild persistent asthma, uncomplicated: Secondary | ICD-10-CM | POA: Diagnosis not present

## 2018-07-15 DIAGNOSIS — J189 Pneumonia, unspecified organism: Secondary | ICD-10-CM | POA: Diagnosis not present

## 2018-07-15 DIAGNOSIS — J9621 Acute and chronic respiratory failure with hypoxia: Secondary | ICD-10-CM | POA: Diagnosis not present

## 2018-07-18 DIAGNOSIS — J189 Pneumonia, unspecified organism: Secondary | ICD-10-CM | POA: Diagnosis not present

## 2018-07-18 DIAGNOSIS — G4733 Obstructive sleep apnea (adult) (pediatric): Secondary | ICD-10-CM | POA: Diagnosis not present

## 2018-07-18 DIAGNOSIS — R05 Cough: Secondary | ICD-10-CM | POA: Diagnosis not present

## 2018-07-18 DIAGNOSIS — R0600 Dyspnea, unspecified: Secondary | ICD-10-CM | POA: Diagnosis not present

## 2018-07-18 DIAGNOSIS — R5383 Other fatigue: Secondary | ICD-10-CM | POA: Diagnosis not present

## 2018-07-18 DIAGNOSIS — J4530 Mild persistent asthma, uncomplicated: Secondary | ICD-10-CM | POA: Diagnosis not present

## 2018-07-18 DIAGNOSIS — J9621 Acute and chronic respiratory failure with hypoxia: Secondary | ICD-10-CM | POA: Diagnosis not present

## 2018-07-19 DIAGNOSIS — G4733 Obstructive sleep apnea (adult) (pediatric): Secondary | ICD-10-CM | POA: Diagnosis not present

## 2018-07-19 DIAGNOSIS — J189 Pneumonia, unspecified organism: Secondary | ICD-10-CM | POA: Diagnosis not present

## 2018-07-19 DIAGNOSIS — J4530 Mild persistent asthma, uncomplicated: Secondary | ICD-10-CM | POA: Diagnosis not present

## 2018-07-19 DIAGNOSIS — R0600 Dyspnea, unspecified: Secondary | ICD-10-CM | POA: Diagnosis not present

## 2018-07-19 DIAGNOSIS — J9621 Acute and chronic respiratory failure with hypoxia: Secondary | ICD-10-CM | POA: Diagnosis not present

## 2018-07-19 DIAGNOSIS — R05 Cough: Secondary | ICD-10-CM | POA: Diagnosis not present

## 2018-07-19 DIAGNOSIS — M545 Low back pain: Secondary | ICD-10-CM | POA: Diagnosis not present

## 2018-07-19 DIAGNOSIS — R5383 Other fatigue: Secondary | ICD-10-CM | POA: Diagnosis not present

## 2018-07-21 DIAGNOSIS — S79912A Unspecified injury of left hip, initial encounter: Secondary | ICD-10-CM | POA: Diagnosis not present

## 2018-07-21 DIAGNOSIS — M25552 Pain in left hip: Secondary | ICD-10-CM | POA: Diagnosis not present

## 2018-07-25 DIAGNOSIS — M25552 Pain in left hip: Secondary | ICD-10-CM | POA: Diagnosis not present

## 2018-07-25 DIAGNOSIS — M545 Low back pain: Secondary | ICD-10-CM | POA: Diagnosis not present

## 2018-07-25 DIAGNOSIS — Z9181 History of falling: Secondary | ICD-10-CM | POA: Diagnosis not present

## 2018-07-26 DIAGNOSIS — M545 Low back pain: Secondary | ICD-10-CM | POA: Diagnosis not present

## 2018-07-26 DIAGNOSIS — R5383 Other fatigue: Secondary | ICD-10-CM | POA: Diagnosis not present

## 2018-07-26 DIAGNOSIS — J4530 Mild persistent asthma, uncomplicated: Secondary | ICD-10-CM | POA: Diagnosis not present

## 2018-07-26 DIAGNOSIS — G4733 Obstructive sleep apnea (adult) (pediatric): Secondary | ICD-10-CM | POA: Diagnosis not present

## 2018-07-26 DIAGNOSIS — J301 Allergic rhinitis due to pollen: Secondary | ICD-10-CM | POA: Diagnosis not present

## 2018-08-02 DIAGNOSIS — M545 Low back pain: Secondary | ICD-10-CM | POA: Diagnosis not present

## 2018-08-03 DIAGNOSIS — M545 Low back pain: Secondary | ICD-10-CM | POA: Diagnosis not present

## 2018-08-04 DIAGNOSIS — M545 Low back pain: Secondary | ICD-10-CM | POA: Diagnosis not present

## 2018-08-11 DIAGNOSIS — M25552 Pain in left hip: Secondary | ICD-10-CM | POA: Diagnosis not present

## 2018-08-11 DIAGNOSIS — Z5181 Encounter for therapeutic drug level monitoring: Secondary | ICD-10-CM | POA: Diagnosis not present

## 2018-08-14 DIAGNOSIS — J4530 Mild persistent asthma, uncomplicated: Secondary | ICD-10-CM | POA: Diagnosis not present

## 2018-08-14 DIAGNOSIS — G4733 Obstructive sleep apnea (adult) (pediatric): Secondary | ICD-10-CM | POA: Diagnosis not present

## 2018-08-14 DIAGNOSIS — J9621 Acute and chronic respiratory failure with hypoxia: Secondary | ICD-10-CM | POA: Diagnosis not present

## 2018-08-25 DIAGNOSIS — M545 Low back pain: Secondary | ICD-10-CM | POA: Diagnosis not present

## 2018-09-15 DIAGNOSIS — S3210XA Unspecified fracture of sacrum, initial encounter for closed fracture: Secondary | ICD-10-CM | POA: Diagnosis not present

## 2018-09-28 DIAGNOSIS — M545 Low back pain: Secondary | ICD-10-CM | POA: Diagnosis not present

## 2018-09-28 DIAGNOSIS — R0600 Dyspnea, unspecified: Secondary | ICD-10-CM | POA: Diagnosis not present

## 2018-09-28 DIAGNOSIS — R05 Cough: Secondary | ICD-10-CM | POA: Diagnosis not present

## 2018-09-28 DIAGNOSIS — J4530 Mild persistent asthma, uncomplicated: Secondary | ICD-10-CM | POA: Diagnosis not present

## 2018-09-28 DIAGNOSIS — J40 Bronchitis, not specified as acute or chronic: Secondary | ICD-10-CM | POA: Diagnosis not present

## 2018-09-28 DIAGNOSIS — R5383 Other fatigue: Secondary | ICD-10-CM | POA: Diagnosis not present

## 2018-09-28 DIAGNOSIS — J301 Allergic rhinitis due to pollen: Secondary | ICD-10-CM | POA: Diagnosis not present

## 2018-09-28 DIAGNOSIS — G4733 Obstructive sleep apnea (adult) (pediatric): Secondary | ICD-10-CM | POA: Diagnosis not present

## 2018-10-03 DIAGNOSIS — J301 Allergic rhinitis due to pollen: Secondary | ICD-10-CM | POA: Diagnosis not present

## 2018-10-04 DIAGNOSIS — M545 Low back pain: Secondary | ICD-10-CM | POA: Diagnosis not present

## 2018-10-04 DIAGNOSIS — R5383 Other fatigue: Secondary | ICD-10-CM | POA: Diagnosis not present

## 2018-10-04 DIAGNOSIS — J40 Bronchitis, not specified as acute or chronic: Secondary | ICD-10-CM | POA: Diagnosis not present

## 2018-10-04 DIAGNOSIS — R05 Cough: Secondary | ICD-10-CM | POA: Diagnosis not present

## 2018-10-04 DIAGNOSIS — J301 Allergic rhinitis due to pollen: Secondary | ICD-10-CM | POA: Diagnosis not present

## 2018-10-04 DIAGNOSIS — R5382 Chronic fatigue, unspecified: Secondary | ICD-10-CM | POA: Diagnosis not present

## 2018-10-04 DIAGNOSIS — G4733 Obstructive sleep apnea (adult) (pediatric): Secondary | ICD-10-CM | POA: Diagnosis not present

## 2018-10-04 DIAGNOSIS — R0600 Dyspnea, unspecified: Secondary | ICD-10-CM | POA: Diagnosis not present

## 2018-10-04 DIAGNOSIS — J4530 Mild persistent asthma, uncomplicated: Secondary | ICD-10-CM | POA: Diagnosis not present

## 2018-10-05 DIAGNOSIS — R05 Cough: Secondary | ICD-10-CM | POA: Diagnosis not present

## 2018-10-05 DIAGNOSIS — G4733 Obstructive sleep apnea (adult) (pediatric): Secondary | ICD-10-CM | POA: Diagnosis not present

## 2018-10-05 DIAGNOSIS — J301 Allergic rhinitis due to pollen: Secondary | ICD-10-CM | POA: Diagnosis not present

## 2018-10-05 DIAGNOSIS — R0600 Dyspnea, unspecified: Secondary | ICD-10-CM | POA: Diagnosis not present

## 2018-10-05 DIAGNOSIS — J40 Bronchitis, not specified as acute or chronic: Secondary | ICD-10-CM | POA: Diagnosis not present

## 2018-10-05 DIAGNOSIS — M545 Low back pain: Secondary | ICD-10-CM | POA: Diagnosis not present

## 2018-10-05 DIAGNOSIS — G894 Chronic pain syndrome: Secondary | ICD-10-CM | POA: Diagnosis not present

## 2018-10-05 DIAGNOSIS — M5416 Radiculopathy, lumbar region: Secondary | ICD-10-CM | POA: Diagnosis not present

## 2018-10-05 DIAGNOSIS — R5383 Other fatigue: Secondary | ICD-10-CM | POA: Diagnosis not present

## 2018-10-05 DIAGNOSIS — M47816 Spondylosis without myelopathy or radiculopathy, lumbar region: Secondary | ICD-10-CM | POA: Diagnosis not present

## 2018-10-05 DIAGNOSIS — J4530 Mild persistent asthma, uncomplicated: Secondary | ICD-10-CM | POA: Diagnosis not present

## 2018-10-05 DIAGNOSIS — S3210XA Unspecified fracture of sacrum, initial encounter for closed fracture: Secondary | ICD-10-CM | POA: Diagnosis not present

## 2018-10-05 DIAGNOSIS — R202 Paresthesia of skin: Secondary | ICD-10-CM | POA: Diagnosis not present

## 2018-10-05 DIAGNOSIS — Z1389 Encounter for screening for other disorder: Secondary | ICD-10-CM | POA: Diagnosis not present

## 2018-10-07 DIAGNOSIS — J301 Allergic rhinitis due to pollen: Secondary | ICD-10-CM | POA: Diagnosis not present

## 2018-10-07 DIAGNOSIS — M545 Low back pain: Secondary | ICD-10-CM | POA: Diagnosis not present

## 2018-10-07 DIAGNOSIS — G4733 Obstructive sleep apnea (adult) (pediatric): Secondary | ICD-10-CM | POA: Diagnosis not present

## 2018-10-07 DIAGNOSIS — J40 Bronchitis, not specified as acute or chronic: Secondary | ICD-10-CM | POA: Diagnosis not present

## 2018-10-07 DIAGNOSIS — J4530 Mild persistent asthma, uncomplicated: Secondary | ICD-10-CM | POA: Diagnosis not present

## 2018-10-07 DIAGNOSIS — R5383 Other fatigue: Secondary | ICD-10-CM | POA: Diagnosis not present

## 2018-10-07 DIAGNOSIS — R05 Cough: Secondary | ICD-10-CM | POA: Diagnosis not present

## 2018-10-07 DIAGNOSIS — R0600 Dyspnea, unspecified: Secondary | ICD-10-CM | POA: Diagnosis not present

## 2018-10-10 DIAGNOSIS — J301 Allergic rhinitis due to pollen: Secondary | ICD-10-CM | POA: Diagnosis not present

## 2018-10-10 DIAGNOSIS — J4530 Mild persistent asthma, uncomplicated: Secondary | ICD-10-CM | POA: Diagnosis not present

## 2018-10-10 DIAGNOSIS — R05 Cough: Secondary | ICD-10-CM | POA: Diagnosis not present

## 2018-10-10 DIAGNOSIS — R5383 Other fatigue: Secondary | ICD-10-CM | POA: Diagnosis not present

## 2018-10-10 DIAGNOSIS — J40 Bronchitis, not specified as acute or chronic: Secondary | ICD-10-CM | POA: Diagnosis not present

## 2018-10-10 DIAGNOSIS — G4733 Obstructive sleep apnea (adult) (pediatric): Secondary | ICD-10-CM | POA: Diagnosis not present

## 2018-10-11 DIAGNOSIS — R5383 Other fatigue: Secondary | ICD-10-CM | POA: Diagnosis not present

## 2018-10-11 DIAGNOSIS — R05 Cough: Secondary | ICD-10-CM | POA: Diagnosis not present

## 2018-10-11 DIAGNOSIS — J301 Allergic rhinitis due to pollen: Secondary | ICD-10-CM | POA: Diagnosis not present

## 2018-10-11 DIAGNOSIS — G4733 Obstructive sleep apnea (adult) (pediatric): Secondary | ICD-10-CM | POA: Diagnosis not present

## 2018-10-11 DIAGNOSIS — J40 Bronchitis, not specified as acute or chronic: Secondary | ICD-10-CM | POA: Diagnosis not present

## 2018-10-11 DIAGNOSIS — J4530 Mild persistent asthma, uncomplicated: Secondary | ICD-10-CM | POA: Diagnosis not present

## 2018-10-12 DIAGNOSIS — J4530 Mild persistent asthma, uncomplicated: Secondary | ICD-10-CM | POA: Diagnosis not present

## 2018-10-12 DIAGNOSIS — R5383 Other fatigue: Secondary | ICD-10-CM | POA: Diagnosis not present

## 2018-10-12 DIAGNOSIS — J301 Allergic rhinitis due to pollen: Secondary | ICD-10-CM | POA: Diagnosis not present

## 2018-10-12 DIAGNOSIS — R05 Cough: Secondary | ICD-10-CM | POA: Diagnosis not present

## 2018-10-12 DIAGNOSIS — G4733 Obstructive sleep apnea (adult) (pediatric): Secondary | ICD-10-CM | POA: Diagnosis not present

## 2018-10-12 DIAGNOSIS — J189 Pneumonia, unspecified organism: Secondary | ICD-10-CM | POA: Diagnosis not present

## 2018-10-12 DIAGNOSIS — J40 Bronchitis, not specified as acute or chronic: Secondary | ICD-10-CM | POA: Diagnosis not present

## 2018-10-13 DIAGNOSIS — R05 Cough: Secondary | ICD-10-CM | POA: Diagnosis not present

## 2018-10-13 DIAGNOSIS — J4530 Mild persistent asthma, uncomplicated: Secondary | ICD-10-CM | POA: Diagnosis not present

## 2018-10-13 DIAGNOSIS — G4733 Obstructive sleep apnea (adult) (pediatric): Secondary | ICD-10-CM | POA: Diagnosis not present

## 2018-10-13 DIAGNOSIS — R5383 Other fatigue: Secondary | ICD-10-CM | POA: Diagnosis not present

## 2018-10-13 DIAGNOSIS — J301 Allergic rhinitis due to pollen: Secondary | ICD-10-CM | POA: Diagnosis not present

## 2018-10-13 DIAGNOSIS — J40 Bronchitis, not specified as acute or chronic: Secondary | ICD-10-CM | POA: Diagnosis not present

## 2018-10-14 DIAGNOSIS — J301 Allergic rhinitis due to pollen: Secondary | ICD-10-CM | POA: Diagnosis not present

## 2018-10-14 DIAGNOSIS — G4733 Obstructive sleep apnea (adult) (pediatric): Secondary | ICD-10-CM | POA: Diagnosis not present

## 2018-10-14 DIAGNOSIS — R5383 Other fatigue: Secondary | ICD-10-CM | POA: Diagnosis not present

## 2018-10-14 DIAGNOSIS — J4530 Mild persistent asthma, uncomplicated: Secondary | ICD-10-CM | POA: Diagnosis not present

## 2018-10-14 DIAGNOSIS — R05 Cough: Secondary | ICD-10-CM | POA: Diagnosis not present

## 2018-10-14 DIAGNOSIS — J40 Bronchitis, not specified as acute or chronic: Secondary | ICD-10-CM | POA: Diagnosis not present

## 2018-10-17 DIAGNOSIS — J301 Allergic rhinitis due to pollen: Secondary | ICD-10-CM | POA: Diagnosis not present

## 2018-10-17 DIAGNOSIS — E876 Hypokalemia: Secondary | ICD-10-CM | POA: Diagnosis not present

## 2018-10-17 DIAGNOSIS — J40 Bronchitis, not specified as acute or chronic: Secondary | ICD-10-CM | POA: Diagnosis not present

## 2018-10-17 DIAGNOSIS — R51 Headache: Secondary | ICD-10-CM | POA: Diagnosis not present

## 2018-10-17 DIAGNOSIS — J4530 Mild persistent asthma, uncomplicated: Secondary | ICD-10-CM | POA: Diagnosis not present

## 2018-10-17 DIAGNOSIS — G4733 Obstructive sleep apnea (adult) (pediatric): Secondary | ICD-10-CM | POA: Diagnosis not present

## 2018-10-17 DIAGNOSIS — R5383 Other fatigue: Secondary | ICD-10-CM | POA: Diagnosis not present

## 2018-10-17 DIAGNOSIS — R05 Cough: Secondary | ICD-10-CM | POA: Diagnosis not present

## 2018-10-20 DIAGNOSIS — R5382 Chronic fatigue, unspecified: Secondary | ICD-10-CM | POA: Diagnosis not present

## 2018-10-24 DIAGNOSIS — J301 Allergic rhinitis due to pollen: Secondary | ICD-10-CM | POA: Diagnosis not present

## 2018-10-24 DIAGNOSIS — J40 Bronchitis, not specified as acute or chronic: Secondary | ICD-10-CM | POA: Diagnosis not present

## 2018-10-24 DIAGNOSIS — R51 Headache: Secondary | ICD-10-CM | POA: Diagnosis not present

## 2018-10-24 DIAGNOSIS — G4733 Obstructive sleep apnea (adult) (pediatric): Secondary | ICD-10-CM | POA: Diagnosis not present

## 2018-10-24 DIAGNOSIS — Z03818 Encounter for observation for suspected exposure to other biological agents ruled out: Secondary | ICD-10-CM | POA: Diagnosis not present

## 2018-10-24 DIAGNOSIS — R5383 Other fatigue: Secondary | ICD-10-CM | POA: Diagnosis not present

## 2018-10-24 DIAGNOSIS — J4530 Mild persistent asthma, uncomplicated: Secondary | ICD-10-CM | POA: Diagnosis not present

## 2018-10-24 DIAGNOSIS — R05 Cough: Secondary | ICD-10-CM | POA: Diagnosis not present

## 2018-10-31 DIAGNOSIS — J40 Bronchitis, not specified as acute or chronic: Secondary | ICD-10-CM | POA: Diagnosis not present

## 2018-10-31 DIAGNOSIS — R5383 Other fatigue: Secondary | ICD-10-CM | POA: Diagnosis not present

## 2018-10-31 DIAGNOSIS — G4733 Obstructive sleep apnea (adult) (pediatric): Secondary | ICD-10-CM | POA: Diagnosis not present

## 2018-10-31 DIAGNOSIS — J301 Allergic rhinitis due to pollen: Secondary | ICD-10-CM | POA: Diagnosis not present

## 2018-10-31 DIAGNOSIS — R05 Cough: Secondary | ICD-10-CM | POA: Diagnosis not present

## 2018-10-31 DIAGNOSIS — J4531 Mild persistent asthma with (acute) exacerbation: Secondary | ICD-10-CM | POA: Diagnosis not present

## 2018-11-02 DIAGNOSIS — S3210XA Unspecified fracture of sacrum, initial encounter for closed fracture: Secondary | ICD-10-CM | POA: Diagnosis not present

## 2018-11-02 DIAGNOSIS — G894 Chronic pain syndrome: Secondary | ICD-10-CM | POA: Diagnosis not present

## 2018-11-02 DIAGNOSIS — M5416 Radiculopathy, lumbar region: Secondary | ICD-10-CM | POA: Diagnosis not present

## 2018-11-02 DIAGNOSIS — R093 Abnormal sputum: Secondary | ICD-10-CM | POA: Diagnosis not present

## 2018-11-02 DIAGNOSIS — M545 Low back pain: Secondary | ICD-10-CM | POA: Diagnosis not present

## 2018-11-02 DIAGNOSIS — Z1389 Encounter for screening for other disorder: Secondary | ICD-10-CM | POA: Diagnosis not present

## 2018-11-02 DIAGNOSIS — M47816 Spondylosis without myelopathy or radiculopathy, lumbar region: Secondary | ICD-10-CM | POA: Diagnosis not present

## 2018-11-02 DIAGNOSIS — R202 Paresthesia of skin: Secondary | ICD-10-CM | POA: Diagnosis not present

## 2018-11-04 DIAGNOSIS — G4733 Obstructive sleep apnea (adult) (pediatric): Secondary | ICD-10-CM | POA: Diagnosis not present

## 2018-11-04 DIAGNOSIS — R0902 Hypoxemia: Secondary | ICD-10-CM | POA: Diagnosis not present

## 2018-11-04 DIAGNOSIS — F329 Major depressive disorder, single episode, unspecified: Secondary | ICD-10-CM | POA: Diagnosis not present

## 2018-11-04 DIAGNOSIS — R0789 Other chest pain: Secondary | ICD-10-CM | POA: Diagnosis not present

## 2018-11-04 DIAGNOSIS — R5383 Other fatigue: Secondary | ICD-10-CM | POA: Diagnosis not present

## 2018-11-04 DIAGNOSIS — R05 Cough: Secondary | ICD-10-CM | POA: Diagnosis not present

## 2018-11-04 DIAGNOSIS — F17210 Nicotine dependence, cigarettes, uncomplicated: Secondary | ICD-10-CM | POA: Diagnosis not present

## 2018-11-04 DIAGNOSIS — J449 Chronic obstructive pulmonary disease, unspecified: Secondary | ICD-10-CM | POA: Diagnosis not present

## 2018-11-04 DIAGNOSIS — J40 Bronchitis, not specified as acute or chronic: Secondary | ICD-10-CM | POA: Diagnosis not present

## 2018-11-04 DIAGNOSIS — F419 Anxiety disorder, unspecified: Secondary | ICD-10-CM | POA: Diagnosis not present

## 2018-11-04 DIAGNOSIS — J301 Allergic rhinitis due to pollen: Secondary | ICD-10-CM | POA: Diagnosis not present

## 2018-11-04 DIAGNOSIS — Z79899 Other long term (current) drug therapy: Secondary | ICD-10-CM | POA: Diagnosis not present

## 2018-11-04 DIAGNOSIS — J4531 Mild persistent asthma with (acute) exacerbation: Secondary | ICD-10-CM | POA: Diagnosis not present

## 2018-11-04 DIAGNOSIS — I2699 Other pulmonary embolism without acute cor pulmonale: Secondary | ICD-10-CM | POA: Diagnosis not present

## 2018-11-05 DIAGNOSIS — G4733 Obstructive sleep apnea (adult) (pediatric): Secondary | ICD-10-CM | POA: Diagnosis not present

## 2018-11-05 DIAGNOSIS — J449 Chronic obstructive pulmonary disease, unspecified: Secondary | ICD-10-CM | POA: Diagnosis not present

## 2018-11-05 DIAGNOSIS — I2699 Other pulmonary embolism without acute cor pulmonale: Secondary | ICD-10-CM | POA: Diagnosis not present

## 2018-11-05 DIAGNOSIS — R0902 Hypoxemia: Secondary | ICD-10-CM | POA: Diagnosis not present

## 2018-11-05 DIAGNOSIS — I517 Cardiomegaly: Secondary | ICD-10-CM | POA: Diagnosis not present

## 2018-11-08 DIAGNOSIS — J301 Allergic rhinitis due to pollen: Secondary | ICD-10-CM | POA: Diagnosis not present

## 2018-11-08 DIAGNOSIS — R05 Cough: Secondary | ICD-10-CM | POA: Diagnosis not present

## 2018-11-08 DIAGNOSIS — I2699 Other pulmonary embolism without acute cor pulmonale: Secondary | ICD-10-CM | POA: Diagnosis not present

## 2018-11-08 DIAGNOSIS — R5383 Other fatigue: Secondary | ICD-10-CM | POA: Diagnosis not present

## 2018-11-08 DIAGNOSIS — G4733 Obstructive sleep apnea (adult) (pediatric): Secondary | ICD-10-CM | POA: Diagnosis not present

## 2018-11-08 DIAGNOSIS — J4531 Mild persistent asthma with (acute) exacerbation: Secondary | ICD-10-CM | POA: Diagnosis not present

## 2018-11-09 DIAGNOSIS — M79662 Pain in left lower leg: Secondary | ICD-10-CM | POA: Diagnosis not present

## 2018-11-09 DIAGNOSIS — M79661 Pain in right lower leg: Secondary | ICD-10-CM | POA: Diagnosis not present

## 2018-11-14 DIAGNOSIS — G4733 Obstructive sleep apnea (adult) (pediatric): Secondary | ICD-10-CM | POA: Diagnosis not present

## 2018-11-14 DIAGNOSIS — J4531 Mild persistent asthma with (acute) exacerbation: Secondary | ICD-10-CM | POA: Diagnosis not present

## 2018-11-14 DIAGNOSIS — J40 Bronchitis, not specified as acute or chronic: Secondary | ICD-10-CM | POA: Diagnosis not present

## 2018-11-14 DIAGNOSIS — I2699 Other pulmonary embolism without acute cor pulmonale: Secondary | ICD-10-CM | POA: Diagnosis not present

## 2018-11-15 DIAGNOSIS — R05 Cough: Secondary | ICD-10-CM | POA: Diagnosis not present

## 2018-11-15 DIAGNOSIS — R5383 Other fatigue: Secondary | ICD-10-CM | POA: Diagnosis not present

## 2018-11-15 DIAGNOSIS — J4531 Mild persistent asthma with (acute) exacerbation: Secondary | ICD-10-CM | POA: Diagnosis not present

## 2018-11-15 DIAGNOSIS — I2699 Other pulmonary embolism without acute cor pulmonale: Secondary | ICD-10-CM | POA: Diagnosis not present

## 2018-11-15 DIAGNOSIS — J301 Allergic rhinitis due to pollen: Secondary | ICD-10-CM | POA: Diagnosis not present

## 2018-11-15 DIAGNOSIS — G4733 Obstructive sleep apnea (adult) (pediatric): Secondary | ICD-10-CM | POA: Diagnosis not present

## 2018-11-24 DIAGNOSIS — R05 Cough: Secondary | ICD-10-CM | POA: Diagnosis not present

## 2018-11-24 DIAGNOSIS — R5383 Other fatigue: Secondary | ICD-10-CM | POA: Diagnosis not present

## 2018-11-24 DIAGNOSIS — G4733 Obstructive sleep apnea (adult) (pediatric): Secondary | ICD-10-CM | POA: Diagnosis not present

## 2018-11-24 DIAGNOSIS — J301 Allergic rhinitis due to pollen: Secondary | ICD-10-CM | POA: Diagnosis not present

## 2018-11-24 DIAGNOSIS — J4531 Mild persistent asthma with (acute) exacerbation: Secondary | ICD-10-CM | POA: Diagnosis not present

## 2018-11-24 DIAGNOSIS — I2699 Other pulmonary embolism without acute cor pulmonale: Secondary | ICD-10-CM | POA: Diagnosis not present

## 2018-11-30 DIAGNOSIS — M545 Low back pain: Secondary | ICD-10-CM | POA: Diagnosis not present

## 2018-11-30 DIAGNOSIS — R202 Paresthesia of skin: Secondary | ICD-10-CM | POA: Diagnosis not present

## 2018-11-30 DIAGNOSIS — M47816 Spondylosis without myelopathy or radiculopathy, lumbar region: Secondary | ICD-10-CM | POA: Diagnosis not present

## 2018-11-30 DIAGNOSIS — S3210XA Unspecified fracture of sacrum, initial encounter for closed fracture: Secondary | ICD-10-CM | POA: Diagnosis not present

## 2018-11-30 DIAGNOSIS — Z1389 Encounter for screening for other disorder: Secondary | ICD-10-CM | POA: Diagnosis not present

## 2018-11-30 DIAGNOSIS — M5416 Radiculopathy, lumbar region: Secondary | ICD-10-CM | POA: Diagnosis not present

## 2018-11-30 DIAGNOSIS — G894 Chronic pain syndrome: Secondary | ICD-10-CM | POA: Diagnosis not present

## 2018-12-05 DIAGNOSIS — E782 Mixed hyperlipidemia: Secondary | ICD-10-CM | POA: Diagnosis not present

## 2018-12-05 DIAGNOSIS — Z23 Encounter for immunization: Secondary | ICD-10-CM | POA: Diagnosis not present

## 2018-12-05 DIAGNOSIS — J449 Chronic obstructive pulmonary disease, unspecified: Secondary | ICD-10-CM | POA: Diagnosis not present

## 2018-12-05 DIAGNOSIS — G4733 Obstructive sleep apnea (adult) (pediatric): Secondary | ICD-10-CM | POA: Diagnosis not present

## 2018-12-05 DIAGNOSIS — F418 Other specified anxiety disorders: Secondary | ICD-10-CM | POA: Diagnosis not present

## 2018-12-06 DIAGNOSIS — E782 Mixed hyperlipidemia: Secondary | ICD-10-CM | POA: Diagnosis not present

## 2018-12-06 DIAGNOSIS — K22710 Barrett's esophagus with low grade dysplasia: Secondary | ICD-10-CM | POA: Diagnosis not present

## 2018-12-19 DIAGNOSIS — M5416 Radiculopathy, lumbar region: Secondary | ICD-10-CM | POA: Diagnosis not present

## 2018-12-19 DIAGNOSIS — R202 Paresthesia of skin: Secondary | ICD-10-CM | POA: Diagnosis not present

## 2018-12-19 DIAGNOSIS — S3210XA Unspecified fracture of sacrum, initial encounter for closed fracture: Secondary | ICD-10-CM | POA: Diagnosis not present

## 2018-12-19 DIAGNOSIS — M545 Low back pain: Secondary | ICD-10-CM | POA: Diagnosis not present

## 2018-12-19 DIAGNOSIS — M546 Pain in thoracic spine: Secondary | ICD-10-CM | POA: Diagnosis not present

## 2018-12-19 DIAGNOSIS — M47816 Spondylosis without myelopathy or radiculopathy, lumbar region: Secondary | ICD-10-CM | POA: Diagnosis not present

## 2018-12-19 DIAGNOSIS — Z1389 Encounter for screening for other disorder: Secondary | ICD-10-CM | POA: Diagnosis not present

## 2018-12-19 DIAGNOSIS — G894 Chronic pain syndrome: Secondary | ICD-10-CM | POA: Diagnosis not present

## 2018-12-20 DIAGNOSIS — J301 Allergic rhinitis due to pollen: Secondary | ICD-10-CM | POA: Diagnosis not present

## 2018-12-20 DIAGNOSIS — R5383 Other fatigue: Secondary | ICD-10-CM | POA: Diagnosis not present

## 2018-12-20 DIAGNOSIS — J4531 Mild persistent asthma with (acute) exacerbation: Secondary | ICD-10-CM | POA: Diagnosis not present

## 2018-12-20 DIAGNOSIS — R05 Cough: Secondary | ICD-10-CM | POA: Diagnosis not present

## 2018-12-20 DIAGNOSIS — S22000A Wedge compression fracture of unspecified thoracic vertebra, initial encounter for closed fracture: Secondary | ICD-10-CM | POA: Diagnosis not present

## 2018-12-20 DIAGNOSIS — I2699 Other pulmonary embolism without acute cor pulmonale: Secondary | ICD-10-CM | POA: Diagnosis not present

## 2018-12-20 DIAGNOSIS — G4733 Obstructive sleep apnea (adult) (pediatric): Secondary | ICD-10-CM | POA: Diagnosis not present

## 2018-12-21 DIAGNOSIS — K219 Gastro-esophageal reflux disease without esophagitis: Secondary | ICD-10-CM | POA: Diagnosis not present

## 2018-12-21 DIAGNOSIS — K2270 Barrett's esophagus without dysplasia: Secondary | ICD-10-CM | POA: Diagnosis not present

## 2018-12-21 DIAGNOSIS — R110 Nausea: Secondary | ICD-10-CM | POA: Diagnosis not present

## 2018-12-22 DIAGNOSIS — M5134 Other intervertebral disc degeneration, thoracic region: Secondary | ICD-10-CM | POA: Diagnosis not present

## 2018-12-22 DIAGNOSIS — M4854XA Collapsed vertebra, not elsewhere classified, thoracic region, initial encounter for fracture: Secondary | ICD-10-CM | POA: Diagnosis not present

## 2018-12-22 DIAGNOSIS — X58XXXD Exposure to other specified factors, subsequent encounter: Secondary | ICD-10-CM | POA: Diagnosis not present

## 2018-12-22 DIAGNOSIS — S22000D Wedge compression fracture of unspecified thoracic vertebra, subsequent encounter for fracture with routine healing: Secondary | ICD-10-CM | POA: Diagnosis not present

## 2018-12-23 DIAGNOSIS — S22000A Wedge compression fracture of unspecified thoracic vertebra, initial encounter for closed fracture: Secondary | ICD-10-CM | POA: Diagnosis not present

## 2018-12-26 DIAGNOSIS — J301 Allergic rhinitis due to pollen: Secondary | ICD-10-CM | POA: Diagnosis not present

## 2019-01-09 DIAGNOSIS — J301 Allergic rhinitis due to pollen: Secondary | ICD-10-CM | POA: Diagnosis not present

## 2019-01-16 DIAGNOSIS — R5383 Other fatigue: Secondary | ICD-10-CM | POA: Diagnosis not present

## 2019-01-16 DIAGNOSIS — J301 Allergic rhinitis due to pollen: Secondary | ICD-10-CM | POA: Diagnosis not present

## 2019-01-16 DIAGNOSIS — R05 Cough: Secondary | ICD-10-CM | POA: Diagnosis not present

## 2019-01-16 DIAGNOSIS — G4733 Obstructive sleep apnea (adult) (pediatric): Secondary | ICD-10-CM | POA: Diagnosis not present

## 2019-01-16 DIAGNOSIS — J4531 Mild persistent asthma with (acute) exacerbation: Secondary | ICD-10-CM | POA: Diagnosis not present

## 2019-01-23 DIAGNOSIS — J301 Allergic rhinitis due to pollen: Secondary | ICD-10-CM | POA: Diagnosis not present

## 2019-01-23 DIAGNOSIS — J069 Acute upper respiratory infection, unspecified: Secondary | ICD-10-CM | POA: Diagnosis not present

## 2019-01-24 DIAGNOSIS — M47816 Spondylosis without myelopathy or radiculopathy, lumbar region: Secondary | ICD-10-CM | POA: Diagnosis not present

## 2019-01-24 DIAGNOSIS — G894 Chronic pain syndrome: Secondary | ICD-10-CM | POA: Diagnosis not present

## 2019-01-24 DIAGNOSIS — M545 Low back pain: Secondary | ICD-10-CM | POA: Diagnosis not present

## 2019-01-24 DIAGNOSIS — S3210XA Unspecified fracture of sacrum, initial encounter for closed fracture: Secondary | ICD-10-CM | POA: Diagnosis not present

## 2019-01-24 DIAGNOSIS — Z1389 Encounter for screening for other disorder: Secondary | ICD-10-CM | POA: Diagnosis not present

## 2019-01-24 DIAGNOSIS — R202 Paresthesia of skin: Secondary | ICD-10-CM | POA: Diagnosis not present

## 2019-01-24 DIAGNOSIS — M546 Pain in thoracic spine: Secondary | ICD-10-CM | POA: Diagnosis not present

## 2019-01-24 DIAGNOSIS — M5416 Radiculopathy, lumbar region: Secondary | ICD-10-CM | POA: Diagnosis not present

## 2019-01-31 DIAGNOSIS — J301 Allergic rhinitis due to pollen: Secondary | ICD-10-CM | POA: Diagnosis not present

## 2019-01-31 DIAGNOSIS — R05 Cough: Secondary | ICD-10-CM | POA: Diagnosis not present

## 2019-01-31 DIAGNOSIS — J4531 Mild persistent asthma with (acute) exacerbation: Secondary | ICD-10-CM | POA: Diagnosis not present

## 2019-01-31 DIAGNOSIS — R5383 Other fatigue: Secondary | ICD-10-CM | POA: Diagnosis not present

## 2019-01-31 DIAGNOSIS — G4733 Obstructive sleep apnea (adult) (pediatric): Secondary | ICD-10-CM | POA: Diagnosis not present

## 2019-02-06 DIAGNOSIS — J301 Allergic rhinitis due to pollen: Secondary | ICD-10-CM | POA: Diagnosis not present

## 2019-02-14 DIAGNOSIS — R5383 Other fatigue: Secondary | ICD-10-CM | POA: Diagnosis not present

## 2019-02-14 DIAGNOSIS — J4531 Mild persistent asthma with (acute) exacerbation: Secondary | ICD-10-CM | POA: Diagnosis not present

## 2019-02-14 DIAGNOSIS — J301 Allergic rhinitis due to pollen: Secondary | ICD-10-CM | POA: Diagnosis not present

## 2019-02-14 DIAGNOSIS — R05 Cough: Secondary | ICD-10-CM | POA: Diagnosis not present

## 2019-02-14 DIAGNOSIS — G4733 Obstructive sleep apnea (adult) (pediatric): Secondary | ICD-10-CM | POA: Diagnosis not present

## 2019-02-20 DIAGNOSIS — J301 Allergic rhinitis due to pollen: Secondary | ICD-10-CM | POA: Diagnosis not present

## 2019-02-21 DIAGNOSIS — M47816 Spondylosis without myelopathy or radiculopathy, lumbar region: Secondary | ICD-10-CM | POA: Diagnosis not present

## 2019-02-21 DIAGNOSIS — M546 Pain in thoracic spine: Secondary | ICD-10-CM | POA: Diagnosis not present

## 2019-02-21 DIAGNOSIS — S3210XA Unspecified fracture of sacrum, initial encounter for closed fracture: Secondary | ICD-10-CM | POA: Diagnosis not present

## 2019-02-21 DIAGNOSIS — G894 Chronic pain syndrome: Secondary | ICD-10-CM | POA: Diagnosis not present

## 2019-02-21 DIAGNOSIS — M5416 Radiculopathy, lumbar region: Secondary | ICD-10-CM | POA: Diagnosis not present

## 2019-02-21 DIAGNOSIS — M545 Low back pain: Secondary | ICD-10-CM | POA: Diagnosis not present

## 2019-02-21 DIAGNOSIS — Z1389 Encounter for screening for other disorder: Secondary | ICD-10-CM | POA: Diagnosis not present

## 2019-02-21 DIAGNOSIS — R202 Paresthesia of skin: Secondary | ICD-10-CM | POA: Diagnosis not present

## 2019-02-21 DIAGNOSIS — S22050D Wedge compression fracture of T5-T6 vertebra, subsequent encounter for fracture with routine healing: Secondary | ICD-10-CM | POA: Diagnosis not present

## 2019-02-27 DIAGNOSIS — J301 Allergic rhinitis due to pollen: Secondary | ICD-10-CM | POA: Diagnosis not present

## 2019-03-15 LAB — HM MAMMOGRAPHY

## 2019-03-15 LAB — HM DEXA SCAN

## 2019-03-21 DIAGNOSIS — S3210XA Unspecified fracture of sacrum, initial encounter for closed fracture: Secondary | ICD-10-CM | POA: Diagnosis not present

## 2019-03-21 DIAGNOSIS — M545 Low back pain: Secondary | ICD-10-CM | POA: Diagnosis not present

## 2019-03-21 DIAGNOSIS — Z1389 Encounter for screening for other disorder: Secondary | ICD-10-CM | POA: Diagnosis not present

## 2019-03-21 DIAGNOSIS — M546 Pain in thoracic spine: Secondary | ICD-10-CM | POA: Diagnosis not present

## 2019-03-21 DIAGNOSIS — R202 Paresthesia of skin: Secondary | ICD-10-CM | POA: Diagnosis not present

## 2019-03-21 DIAGNOSIS — M47816 Spondylosis without myelopathy or radiculopathy, lumbar region: Secondary | ICD-10-CM | POA: Diagnosis not present

## 2019-03-21 DIAGNOSIS — M5416 Radiculopathy, lumbar region: Secondary | ICD-10-CM | POA: Diagnosis not present

## 2019-03-21 DIAGNOSIS — S22050D Wedge compression fracture of T5-T6 vertebra, subsequent encounter for fracture with routine healing: Secondary | ICD-10-CM | POA: Diagnosis not present

## 2019-03-21 DIAGNOSIS — G894 Chronic pain syndrome: Secondary | ICD-10-CM | POA: Diagnosis not present

## 2019-03-27 DIAGNOSIS — J301 Allergic rhinitis due to pollen: Secondary | ICD-10-CM | POA: Diagnosis not present

## 2019-03-27 DIAGNOSIS — R05 Cough: Secondary | ICD-10-CM | POA: Diagnosis not present

## 2019-03-27 DIAGNOSIS — G4733 Obstructive sleep apnea (adult) (pediatric): Secondary | ICD-10-CM | POA: Diagnosis not present

## 2019-03-27 DIAGNOSIS — J4531 Mild persistent asthma with (acute) exacerbation: Secondary | ICD-10-CM | POA: Diagnosis not present

## 2019-03-27 DIAGNOSIS — R5383 Other fatigue: Secondary | ICD-10-CM | POA: Diagnosis not present

## 2019-03-30 DIAGNOSIS — R5383 Other fatigue: Secondary | ICD-10-CM | POA: Diagnosis not present

## 2019-03-30 DIAGNOSIS — E43 Unspecified severe protein-calorie malnutrition: Secondary | ICD-10-CM | POA: Diagnosis not present

## 2019-03-30 DIAGNOSIS — R519 Headache, unspecified: Secondary | ICD-10-CM | POA: Diagnosis not present

## 2019-03-30 DIAGNOSIS — R928 Other abnormal and inconclusive findings on diagnostic imaging of breast: Secondary | ICD-10-CM | POA: Diagnosis not present

## 2019-03-30 DIAGNOSIS — M810 Age-related osteoporosis without current pathological fracture: Secondary | ICD-10-CM | POA: Diagnosis not present

## 2019-03-30 DIAGNOSIS — R634 Abnormal weight loss: Secondary | ICD-10-CM | POA: Diagnosis not present

## 2019-03-30 DIAGNOSIS — L650 Telogen effluvium: Secondary | ICD-10-CM | POA: Diagnosis not present

## 2019-03-30 DIAGNOSIS — F332 Major depressive disorder, recurrent severe without psychotic features: Secondary | ICD-10-CM | POA: Diagnosis not present

## 2019-03-30 DIAGNOSIS — D519 Vitamin B12 deficiency anemia, unspecified: Secondary | ICD-10-CM | POA: Diagnosis not present

## 2019-03-30 DIAGNOSIS — J410 Simple chronic bronchitis: Secondary | ICD-10-CM | POA: Diagnosis not present

## 2019-04-05 DIAGNOSIS — R634 Abnormal weight loss: Secondary | ICD-10-CM | POA: Diagnosis not present

## 2019-04-05 DIAGNOSIS — J449 Chronic obstructive pulmonary disease, unspecified: Secondary | ICD-10-CM | POA: Diagnosis not present

## 2019-04-12 DIAGNOSIS — R110 Nausea: Secondary | ICD-10-CM | POA: Diagnosis not present

## 2019-04-12 DIAGNOSIS — K219 Gastro-esophageal reflux disease without esophagitis: Secondary | ICD-10-CM | POA: Diagnosis not present

## 2019-04-12 DIAGNOSIS — K2270 Barrett's esophagus without dysplasia: Secondary | ICD-10-CM | POA: Diagnosis not present

## 2019-04-12 DIAGNOSIS — N6323 Unspecified lump in the left breast, lower outer quadrant: Secondary | ICD-10-CM | POA: Diagnosis not present

## 2019-04-12 DIAGNOSIS — R928 Other abnormal and inconclusive findings on diagnostic imaging of breast: Secondary | ICD-10-CM | POA: Diagnosis not present

## 2019-04-12 DIAGNOSIS — R922 Inconclusive mammogram: Secondary | ICD-10-CM | POA: Diagnosis not present

## 2019-04-18 DIAGNOSIS — M5416 Radiculopathy, lumbar region: Secondary | ICD-10-CM | POA: Diagnosis not present

## 2019-04-18 DIAGNOSIS — M545 Low back pain: Secondary | ICD-10-CM | POA: Diagnosis not present

## 2019-04-18 DIAGNOSIS — G894 Chronic pain syndrome: Secondary | ICD-10-CM | POA: Diagnosis not present

## 2019-04-18 DIAGNOSIS — S22050D Wedge compression fracture of T5-T6 vertebra, subsequent encounter for fracture with routine healing: Secondary | ICD-10-CM | POA: Diagnosis not present

## 2019-04-18 DIAGNOSIS — Z1389 Encounter for screening for other disorder: Secondary | ICD-10-CM | POA: Diagnosis not present

## 2019-04-18 DIAGNOSIS — M47816 Spondylosis without myelopathy or radiculopathy, lumbar region: Secondary | ICD-10-CM | POA: Diagnosis not present

## 2019-04-18 DIAGNOSIS — M546 Pain in thoracic spine: Secondary | ICD-10-CM | POA: Diagnosis not present

## 2019-04-18 DIAGNOSIS — R202 Paresthesia of skin: Secondary | ICD-10-CM | POA: Diagnosis not present

## 2019-04-18 DIAGNOSIS — S3210XA Unspecified fracture of sacrum, initial encounter for closed fracture: Secondary | ICD-10-CM | POA: Diagnosis not present

## 2019-04-27 ENCOUNTER — Other Ambulatory Visit: Payer: Self-pay

## 2019-04-27 ENCOUNTER — Encounter: Payer: Self-pay | Admitting: Family Medicine

## 2019-04-27 ENCOUNTER — Ambulatory Visit (INDEPENDENT_AMBULATORY_CARE_PROVIDER_SITE_OTHER): Payer: Medicare Other | Admitting: Family Medicine

## 2019-04-27 VITALS — BP 120/64 | HR 88 | Temp 98.7°F | Resp 16 | Ht 64.0 in | Wt 132.2 lb

## 2019-04-27 DIAGNOSIS — M8000XA Age-related osteoporosis with current pathological fracture, unspecified site, initial encounter for fracture: Secondary | ICD-10-CM

## 2019-04-27 DIAGNOSIS — F321 Major depressive disorder, single episode, moderate: Secondary | ICD-10-CM | POA: Diagnosis not present

## 2019-04-27 DIAGNOSIS — R634 Abnormal weight loss: Secondary | ICD-10-CM | POA: Diagnosis not present

## 2019-04-27 DIAGNOSIS — Z122 Encounter for screening for malignant neoplasm of respiratory organs: Secondary | ICD-10-CM | POA: Diagnosis not present

## 2019-04-27 DIAGNOSIS — E785 Hyperlipidemia, unspecified: Secondary | ICD-10-CM | POA: Diagnosis not present

## 2019-04-27 DIAGNOSIS — J438 Other emphysema: Secondary | ICD-10-CM | POA: Diagnosis not present

## 2019-04-27 DIAGNOSIS — F330 Major depressive disorder, recurrent, mild: Secondary | ICD-10-CM | POA: Insufficient documentation

## 2019-04-27 MED ORDER — CLONAZEPAM 1 MG PO TABS: 1.0000 mg | ORAL_TABLET | Freq: Two times a day (BID) | ORAL | 1 refills | Status: DC | PRN

## 2019-04-27 MED ORDER — SERTRALINE HCL 100 MG PO TABS: 200.0000 mg | ORAL_TABLET | Freq: Every day | ORAL | 0 refills | Status: DC

## 2019-04-27 NOTE — Progress Notes (Signed)
Subjective:  Patient ID: Ellen Holloway, female    DOB: Feb 02, 1951  Age: 69 y.o. MRN: [id number]  Chief Complaint  Patient presents with  . Depression  . Weight Loss  . COPD    HPI  Patient is a 69 year old white female with history of COPD and chronic smoking.  She presented early in January with significant weight loss over the last 4 to 6 months.  The patient has a extraordinarily difficult year in 2020 due to numerous pneumonias, compression fractures, as well as COVID-19 restrictions.  She has had significant depression.  Initial lab work-up done in January was normal.  She did have an abnormal mammogram, but a diagnostic mammogram was normal.  Chest x-ray was normal other than evidence of COPD.  I increased her zoloft to 100 mg once a day at her last visit. I discontinued citalopram.  Her appetite has improved. Her depression has improved.  She has not had a low contrast CT scan of her lungs.  Although she is currently only smoking half a pack per day she previously smoked up to 2 packs/day.  She began as a teenager around the age of 49.  In addition to trying to eat 3 meals a day she is also drinking 1 Ensure daily.  Social Hx   Social History   Socioeconomic History  . Marital status: Married    Spouse name: Not on file  . Number of children: Not on file  . Years of education: Not on file  . Highest education level: Not on file  Occupational History  . Not on file  Tobacco Use  . Smoking status: Current Every Day Smoker    Packs/day: 0.50    Years: 55.00    Pack years: 27.50    Types: Cigarettes  . Smokeless tobacco: Never Used  . Tobacco comment: Patient used to smoke up to 2 PPD.  Substance and Sexual Activity  . Alcohol use: Never  . Drug use: Never  . Sexual activity: Not on file  Other Topics Concern  . Not on file  Social History Narrative  . Not on file   Social Determinants of Health   Financial Resource Strain:   . Difficulty of Paying Living Expenses:  Not on file  Food Insecurity:   . Worried About Charity fundraiser in the Last Year: Not on file  . Ran Out of Food in the Last Year: Not on file  Transportation Needs:   . Lack of Transportation (Medical): Not on file  . Lack of Transportation (Non-Medical): Not on file  Physical Activity:   . Days of Exercise per Week: Not on file  . Minutes of Exercise per Session: Not on file  Stress:   . Feeling of Stress : Not on file  Social Connections:   . Frequency of Communication with Friends and Family: Not on file  . Frequency of Social Gatherings with Friends and Family: Not on file  . Attends Religious Services: Not on file  . Active Member of Clubs or Organizations: Not on file  . Attends Archivist Meetings: Not on file  . Marital Status: Not on file   Past Medical History:  Diagnosis Date  . Acquired absence of both cervix and uterus   . Age-related osteoporosis with current pathological fracture, unspecified site, initial encounter for fracture   . Barrett's esophagus with low grade dysplasia   . Chronic obstructive pulmonary disease (COPD) ([place])   . Major depressive disorder, recurrent  severe without psychotic features (HCC)   . Mixed hyperlipidemia   . Obstructive sleep apnea (adult) (pediatric)   . Other obesity due to excess calories   . Other pulmonary embolism without acute cor pulmonale (HCC)   . Other specified anxiety disorders   . Sleep related leg cramps   . Telogen effluvium    The patient has a family history of myocardial infarction, renal failure (Wegener's)  Review of Systems  Constitutional: Negative for chills, fatigue and fever.  HENT: Positive for rhinorrhea. Negative for congestion, ear pain and sore throat.   Respiratory: Negative for cough and shortness of breath.   Cardiovascular: Negative for chest pain.  Gastrointestinal: Negative for abdominal pain, constipation, diarrhea, nausea and vomiting.  Psychiatric/Behavioral: Positive for  dysphoric mood.     Objective:  BP 120/64 (BP Location: Left Arm, Patient Position: Sitting, Cuff Size: Normal)   Pulse 88   Temp 98.7 F (37.1 C)   Resp 16   Ht 5\' 4"  (1.626 m)   Wt 132 lb 3.2 oz (60 kg)   BMI 22.69 kg/m   BP/Weight 04/27/2019  Systolic BP 120  Diastolic BP 64  Wt. (Lbs) 132.2  BMI 22.69    Physical Exam Constitutional:      Appearance: Normal appearance.  Cardiovascular:     Rate and Rhythm: Normal rate and regular rhythm.  Pulmonary:     Effort: Pulmonary effort is normal.     Breath sounds: Normal breath sounds.  Abdominal:     General: Bowel sounds are normal.     Palpations: Abdomen is soft.     Tenderness: There is no abdominal tenderness.  Neurological:     Mental Status: She is alert.  Psychiatric:        Mood and Affect: Mood normal.        Behavior: Behavior normal.    Assessment & Plan:   Problem List Items Addressed This Visit      Respiratory   COPD (chronic obstructive pulmonary disease) (HCC)    Continue current treatment.  Strongly recommend smoking cessation.        Musculoskeletal and Integument   Age-related osteoporosis with current pathological fracture    Patient is suffered numerous compression fractures.  Ordered Prolia.  Discontinued Fosamax due to history of Barrett's esophagus.        Other   Depression, major, single episode, moderate (HCC)    Depression has significantly improved on citalopram 40 mg once daily.  Her appetite has increased and her weight as well.      Relevant Medications   clonazePAM (KLONOPIN) 1 MG tablet   sertraline (ZOLOFT) 100 MG tablet   Encounter for screening for lung cancer    Order low contrast CT scan of lungs.      Dyslipidemia    Continue atorvastatin      Abnormal weight loss - Primary    Weight loss has resolved.  Patient has gained weight.  Continue to recommend eating 3 meals a day as well as 1 boost a day.         Encounter for screening for lung  cancer Order low contrast CT scan of lungs.  Dyslipidemia Continue atorvastatin  Depression, major, single episode, moderate (HCC) Depression has significantly improved on citalopram 40 mg once daily.  Her appetite has increased and her weight as well.  COPD (chronic obstructive pulmonary disease) (HCC) Continue current treatment.  Strongly recommend smoking cessation.  Age-related osteoporosis with current pathological fracture Patient is suffered  numerous compression fractures.  Ordered Prolia.  Discontinued Fosamax due to history of Barrett's esophagus.  Abnormal weight loss Weight loss has resolved.  Patient has gained weight.  Continue to recommend eating 3 meals a day as well as 1 boost a day.   Meds ordered this encounter  Medications  . clonazePAM (KLONOPIN) 1 MG tablet    Sig: Take 1 tablet (1 mg total) by mouth 2 (two) times daily as needed for anxiety.    Dispense:  20 tablet    Refill:  1  . sertraline (ZOLOFT) 100 MG tablet    Sig: Take 2 tablets (200 mg total) by mouth daily.    Dispense:  180 tablet    Refill:  0    AN INDIVIDUALIZED CARE PLAN: was established or reinforced today.   The patient's disease status was assessed using clinical findings on exam, labs, and/or other diagnostic testing, such as xrays, to determine his/her success in meeting treatment goals based on disease specific evidence-based guidelines and found to be well controlled.   SELF MANAGEMENT: The patient and I together assessed ways to personally work towards obtaining the recommended goals  Support needs The patient and/or family needs were assessed and services were offered and not necessary at this time.    Follow-up: No follow-ups on file.  A CLINICAL SUMMARY including a written plan identify barriers to care unique to individual due to social or financial issues and help create solutions together. and a patient's and the patient's families understanding of their medical issues and care  needs   Louanne Skye [facility] [phone]

## 2019-04-30 ENCOUNTER — Encounter: Payer: Self-pay | Admitting: Family Medicine

## 2019-04-30 DIAGNOSIS — M8000XA Age-related osteoporosis with current pathological fracture, unspecified site, initial encounter for fracture: Secondary | ICD-10-CM | POA: Insufficient documentation

## 2019-04-30 DIAGNOSIS — M810 Age-related osteoporosis without current pathological fracture: Secondary | ICD-10-CM | POA: Insufficient documentation

## 2019-04-30 DIAGNOSIS — R634 Abnormal weight loss: Secondary | ICD-10-CM | POA: Insufficient documentation

## 2019-04-30 NOTE — Patient Instructions (Signed)
Encounter for screening for lung cancer Order low contrast CT scan of lungs.  Dyslipidemia Continue atorvastatin  Depression, major, single episode, moderate (HCC) Depression has significantly improved on citalopram 40 mg once daily.  Her appetite has increased and her weight as well.  COPD (chronic obstructive pulmonary disease) (HCC) Continue current treatment.  Strongly recommend smoking cessation.  Age-related osteoporosis with current pathological fracture Patient is suffered numerous compression fractures.  Ordered Prolia.  Discontinued Fosamax due to history of Barrett's esophagus.  Abnormal weight loss Weight loss has resolved.  Patient has gained weight.  Continue to recommend eating 3 meals a day as well as 1 boost a day.

## 2019-04-30 NOTE — Assessment & Plan Note (Signed)
Patient is suffered numerous compression fractures.  Ordered Prolia.  Discontinued Fosamax due to history of Barrett's esophagus.

## 2019-04-30 NOTE — Assessment & Plan Note (Signed)
Weight loss has resolved.  Patient has gained weight.  Continue to recommend eating 3 meals a day as well as 1 boost a day.

## 2019-04-30 NOTE — Assessment & Plan Note (Signed)
Continue atorvastatin

## 2019-04-30 NOTE — Assessment & Plan Note (Signed)
Order low contrast CT scan of lungs.

## 2019-04-30 NOTE — Assessment & Plan Note (Signed)
Continue current treatment.  Strongly recommend smoking cessation.

## 2019-04-30 NOTE — Assessment & Plan Note (Addendum)
Depression has significantly improved, but would like to increase zoloft to improve. Increase zoloft 100 mg 2 daily. Her appetite has increased and her weight as well.

## 2019-05-12 DIAGNOSIS — Z87891 Personal history of nicotine dependence: Secondary | ICD-10-CM | POA: Diagnosis not present

## 2019-05-12 DIAGNOSIS — F17210 Nicotine dependence, cigarettes, uncomplicated: Secondary | ICD-10-CM | POA: Diagnosis not present

## 2019-05-15 DIAGNOSIS — R5383 Other fatigue: Secondary | ICD-10-CM | POA: Diagnosis not present

## 2019-05-15 DIAGNOSIS — J4531 Mild persistent asthma with (acute) exacerbation: Secondary | ICD-10-CM | POA: Diagnosis not present

## 2019-05-15 DIAGNOSIS — G4733 Obstructive sleep apnea (adult) (pediatric): Secondary | ICD-10-CM | POA: Diagnosis not present

## 2019-05-15 DIAGNOSIS — J301 Allergic rhinitis due to pollen: Secondary | ICD-10-CM | POA: Diagnosis not present

## 2019-05-16 DIAGNOSIS — M5416 Radiculopathy, lumbar region: Secondary | ICD-10-CM | POA: Diagnosis not present

## 2019-05-16 DIAGNOSIS — M546 Pain in thoracic spine: Secondary | ICD-10-CM | POA: Diagnosis not present

## 2019-05-16 DIAGNOSIS — S3210XA Unspecified fracture of sacrum, initial encounter for closed fracture: Secondary | ICD-10-CM | POA: Diagnosis not present

## 2019-05-16 DIAGNOSIS — Z1389 Encounter for screening for other disorder: Secondary | ICD-10-CM | POA: Diagnosis not present

## 2019-05-16 DIAGNOSIS — R202 Paresthesia of skin: Secondary | ICD-10-CM | POA: Diagnosis not present

## 2019-05-16 DIAGNOSIS — M47816 Spondylosis without myelopathy or radiculopathy, lumbar region: Secondary | ICD-10-CM | POA: Diagnosis not present

## 2019-05-16 DIAGNOSIS — G894 Chronic pain syndrome: Secondary | ICD-10-CM | POA: Diagnosis not present

## 2019-05-16 DIAGNOSIS — M545 Low back pain: Secondary | ICD-10-CM | POA: Diagnosis not present

## 2019-05-16 DIAGNOSIS — S22050D Wedge compression fracture of T5-T6 vertebra, subsequent encounter for fracture with routine healing: Secondary | ICD-10-CM | POA: Diagnosis not present

## 2019-05-17 DIAGNOSIS — Z23 Encounter for immunization: Secondary | ICD-10-CM | POA: Diagnosis not present

## 2019-05-22 ENCOUNTER — Other Ambulatory Visit: Payer: Self-pay

## 2019-05-22 DIAGNOSIS — Z122 Encounter for screening for malignant neoplasm of respiratory organs: Secondary | ICD-10-CM

## 2019-05-22 DIAGNOSIS — R634 Abnormal weight loss: Secondary | ICD-10-CM

## 2019-05-30 ENCOUNTER — Other Ambulatory Visit: Payer: Self-pay

## 2019-05-30 DIAGNOSIS — G894 Chronic pain syndrome: Secondary | ICD-10-CM | POA: Diagnosis not present

## 2019-05-30 DIAGNOSIS — S3210XA Unspecified fracture of sacrum, initial encounter for closed fracture: Secondary | ICD-10-CM | POA: Diagnosis not present

## 2019-05-30 DIAGNOSIS — M47816 Spondylosis without myelopathy or radiculopathy, lumbar region: Secondary | ICD-10-CM | POA: Diagnosis not present

## 2019-05-30 DIAGNOSIS — M5416 Radiculopathy, lumbar region: Secondary | ICD-10-CM | POA: Diagnosis not present

## 2019-05-30 DIAGNOSIS — Z1389 Encounter for screening for other disorder: Secondary | ICD-10-CM | POA: Diagnosis not present

## 2019-05-30 DIAGNOSIS — M545 Low back pain: Secondary | ICD-10-CM | POA: Diagnosis not present

## 2019-05-30 DIAGNOSIS — R202 Paresthesia of skin: Secondary | ICD-10-CM | POA: Diagnosis not present

## 2019-05-30 DIAGNOSIS — M546 Pain in thoracic spine: Secondary | ICD-10-CM | POA: Diagnosis not present

## 2019-05-30 DIAGNOSIS — S22050D Wedge compression fracture of T5-T6 vertebra, subsequent encounter for fracture with routine healing: Secondary | ICD-10-CM | POA: Diagnosis not present

## 2019-05-30 MED ORDER — PRAMIPEXOLE DIHYDROCHLORIDE 1 MG PO TABS: 1.0000 mg | ORAL_TABLET | Freq: Every day | ORAL | 0 refills | Status: DC

## 2019-06-05 ENCOUNTER — Other Ambulatory Visit: Payer: Self-pay

## 2019-06-05 ENCOUNTER — Ambulatory Visit: Payer: Medicare Other | Admitting: Nurse Practitioner

## 2019-06-05 MED ORDER — PANTOPRAZOLE SODIUM 40 MG PO TBEC: 40.0000 mg | DELAYED_RELEASE_TABLET | Freq: Two times a day (BID) | ORAL | 0 refills | Status: DC

## 2019-06-05 MED ORDER — SPIRONOLACTONE 25 MG PO TABS: 25.0000 mg | ORAL_TABLET | Freq: Every day | ORAL | 0 refills | Status: DC

## 2019-06-05 MED ORDER — FUROSEMIDE 20 MG PO TABS: 20.0000 mg | ORAL_TABLET | Freq: Every day | ORAL | 0 refills | Status: DC | PRN

## 2019-06-14 DIAGNOSIS — Z23 Encounter for immunization: Secondary | ICD-10-CM | POA: Diagnosis not present

## 2019-06-21 DIAGNOSIS — K219 Gastro-esophageal reflux disease without esophagitis: Secondary | ICD-10-CM | POA: Diagnosis not present

## 2019-06-21 DIAGNOSIS — K2270 Barrett's esophagus without dysplasia: Secondary | ICD-10-CM | POA: Diagnosis not present

## 2019-06-27 DIAGNOSIS — S22000A Wedge compression fracture of unspecified thoracic vertebra, initial encounter for closed fracture: Secondary | ICD-10-CM | POA: Diagnosis not present

## 2019-06-28 DIAGNOSIS — Z1389 Encounter for screening for other disorder: Secondary | ICD-10-CM | POA: Diagnosis not present

## 2019-06-28 DIAGNOSIS — G894 Chronic pain syndrome: Secondary | ICD-10-CM | POA: Diagnosis not present

## 2019-06-28 DIAGNOSIS — M546 Pain in thoracic spine: Secondary | ICD-10-CM | POA: Diagnosis not present

## 2019-06-28 DIAGNOSIS — M545 Low back pain: Secondary | ICD-10-CM | POA: Diagnosis not present

## 2019-06-28 DIAGNOSIS — M47816 Spondylosis without myelopathy or radiculopathy, lumbar region: Secondary | ICD-10-CM | POA: Diagnosis not present

## 2019-06-28 DIAGNOSIS — R202 Paresthesia of skin: Secondary | ICD-10-CM | POA: Diagnosis not present

## 2019-06-28 DIAGNOSIS — S22050D Wedge compression fracture of T5-T6 vertebra, subsequent encounter for fracture with routine healing: Secondary | ICD-10-CM | POA: Diagnosis not present

## 2019-06-28 DIAGNOSIS — S3210XA Unspecified fracture of sacrum, initial encounter for closed fracture: Secondary | ICD-10-CM | POA: Diagnosis not present

## 2019-06-28 DIAGNOSIS — M5416 Radiculopathy, lumbar region: Secondary | ICD-10-CM | POA: Diagnosis not present

## 2019-06-30 DIAGNOSIS — Z8249 Family history of ischemic heart disease and other diseases of the circulatory system: Secondary | ICD-10-CM | POA: Diagnosis not present

## 2019-06-30 DIAGNOSIS — Z86711 Personal history of pulmonary embolism: Secondary | ICD-10-CM | POA: Diagnosis not present

## 2019-06-30 DIAGNOSIS — Z1379 Encounter for other screening for genetic and chromosomal anomalies: Secondary | ICD-10-CM | POA: Diagnosis not present

## 2019-06-30 DIAGNOSIS — I341 Nonrheumatic mitral (valve) prolapse: Secondary | ICD-10-CM | POA: Diagnosis not present

## 2019-07-03 DIAGNOSIS — Z20828 Contact with and (suspected) exposure to other viral communicable diseases: Secondary | ICD-10-CM | POA: Diagnosis not present

## 2019-07-04 DIAGNOSIS — M6281 Muscle weakness (generalized): Secondary | ICD-10-CM | POA: Diagnosis not present

## 2019-07-04 DIAGNOSIS — M546 Pain in thoracic spine: Secondary | ICD-10-CM | POA: Diagnosis not present

## 2019-07-07 DIAGNOSIS — K229 Disease of esophagus, unspecified: Secondary | ICD-10-CM | POA: Diagnosis not present

## 2019-07-07 DIAGNOSIS — Z79891 Long term (current) use of opiate analgesic: Secondary | ICD-10-CM | POA: Diagnosis not present

## 2019-07-07 DIAGNOSIS — G4730 Sleep apnea, unspecified: Secondary | ICD-10-CM | POA: Diagnosis not present

## 2019-07-07 DIAGNOSIS — R1012 Left upper quadrant pain: Secondary | ICD-10-CM | POA: Diagnosis not present

## 2019-07-07 DIAGNOSIS — Z86711 Personal history of pulmonary embolism: Secondary | ICD-10-CM | POA: Diagnosis not present

## 2019-07-07 DIAGNOSIS — K222 Esophageal obstruction: Secondary | ICD-10-CM | POA: Diagnosis not present

## 2019-07-07 DIAGNOSIS — K2970 Gastritis, unspecified, without bleeding: Secondary | ICD-10-CM | POA: Diagnosis not present

## 2019-07-07 DIAGNOSIS — R1310 Dysphagia, unspecified: Secondary | ICD-10-CM | POA: Diagnosis not present

## 2019-07-07 DIAGNOSIS — Z9981 Dependence on supplemental oxygen: Secondary | ICD-10-CM | POA: Diagnosis not present

## 2019-07-07 DIAGNOSIS — K449 Diaphragmatic hernia without obstruction or gangrene: Secondary | ICD-10-CM | POA: Diagnosis not present

## 2019-07-07 DIAGNOSIS — K2270 Barrett's esophagus without dysplasia: Secondary | ICD-10-CM | POA: Diagnosis not present

## 2019-07-07 DIAGNOSIS — Z9989 Dependence on other enabling machines and devices: Secondary | ICD-10-CM | POA: Diagnosis not present

## 2019-07-07 DIAGNOSIS — J449 Chronic obstructive pulmonary disease, unspecified: Secondary | ICD-10-CM | POA: Diagnosis not present

## 2019-07-07 DIAGNOSIS — Z79899 Other long term (current) drug therapy: Secondary | ICD-10-CM | POA: Diagnosis not present

## 2019-07-07 DIAGNOSIS — K22719 Barrett's esophagus with dysplasia, unspecified: Secondary | ICD-10-CM | POA: Diagnosis not present

## 2019-07-07 DIAGNOSIS — K219 Gastro-esophageal reflux disease without esophagitis: Secondary | ICD-10-CM | POA: Diagnosis not present

## 2019-07-10 DIAGNOSIS — M546 Pain in thoracic spine: Secondary | ICD-10-CM | POA: Diagnosis not present

## 2019-07-10 DIAGNOSIS — M6281 Muscle weakness (generalized): Secondary | ICD-10-CM | POA: Diagnosis not present

## 2019-07-12 DIAGNOSIS — M6281 Muscle weakness (generalized): Secondary | ICD-10-CM | POA: Diagnosis not present

## 2019-07-12 DIAGNOSIS — M546 Pain in thoracic spine: Secondary | ICD-10-CM | POA: Diagnosis not present

## 2019-07-17 DIAGNOSIS — M546 Pain in thoracic spine: Secondary | ICD-10-CM | POA: Diagnosis not present

## 2019-07-17 DIAGNOSIS — M6281 Muscle weakness (generalized): Secondary | ICD-10-CM | POA: Diagnosis not present

## 2019-07-19 ENCOUNTER — Other Ambulatory Visit: Payer: Self-pay | Admitting: Family Medicine

## 2019-07-19 DIAGNOSIS — F321 Major depressive disorder, single episode, moderate: Secondary | ICD-10-CM

## 2019-07-19 DIAGNOSIS — M6281 Muscle weakness (generalized): Secondary | ICD-10-CM | POA: Diagnosis not present

## 2019-07-19 DIAGNOSIS — M546 Pain in thoracic spine: Secondary | ICD-10-CM | POA: Diagnosis not present

## 2019-07-24 ENCOUNTER — Other Ambulatory Visit: Payer: Self-pay

## 2019-07-24 DIAGNOSIS — I700 Atherosclerosis of aorta: Secondary | ICD-10-CM | POA: Diagnosis not present

## 2019-07-24 DIAGNOSIS — J439 Emphysema, unspecified: Secondary | ICD-10-CM | POA: Diagnosis not present

## 2019-07-24 DIAGNOSIS — M4854XA Collapsed vertebra, not elsewhere classified, thoracic region, initial encounter for fracture: Secondary | ICD-10-CM | POA: Diagnosis not present

## 2019-07-24 DIAGNOSIS — I2699 Other pulmonary embolism without acute cor pulmonale: Secondary | ICD-10-CM | POA: Diagnosis not present

## 2019-07-24 DIAGNOSIS — F321 Major depressive disorder, single episode, moderate: Secondary | ICD-10-CM

## 2019-07-24 DIAGNOSIS — J301 Allergic rhinitis due to pollen: Secondary | ICD-10-CM | POA: Diagnosis not present

## 2019-07-24 DIAGNOSIS — G4733 Obstructive sleep apnea (adult) (pediatric): Secondary | ICD-10-CM | POA: Diagnosis not present

## 2019-07-24 DIAGNOSIS — J4531 Mild persistent asthma with (acute) exacerbation: Secondary | ICD-10-CM | POA: Diagnosis not present

## 2019-07-24 DIAGNOSIS — R0602 Shortness of breath: Secondary | ICD-10-CM | POA: Diagnosis not present

## 2019-07-24 DIAGNOSIS — R5383 Other fatigue: Secondary | ICD-10-CM | POA: Diagnosis not present

## 2019-07-24 MED ORDER — SERTRALINE HCL 100 MG PO TABS: 200.0000 mg | ORAL_TABLET | Freq: Every day | ORAL | 0 refills | Status: DC

## 2019-07-24 MED ORDER — ATORVASTATIN CALCIUM 10 MG PO TABS: 10.0000 mg | ORAL_TABLET | Freq: Every day | ORAL | 0 refills | Status: DC

## 2019-07-25 ENCOUNTER — Other Ambulatory Visit: Payer: Self-pay

## 2019-07-25 DIAGNOSIS — F321 Major depressive disorder, single episode, moderate: Secondary | ICD-10-CM

## 2019-07-25 MED ORDER — SERTRALINE HCL 100 MG PO TABS: 200.0000 mg | ORAL_TABLET | Freq: Every day | ORAL | 0 refills | Status: DC

## 2019-07-26 ENCOUNTER — Ambulatory Visit: Payer: Medicare Other | Admitting: Family Medicine

## 2019-07-26 DIAGNOSIS — M5416 Radiculopathy, lumbar region: Secondary | ICD-10-CM | POA: Diagnosis not present

## 2019-07-26 DIAGNOSIS — G894 Chronic pain syndrome: Secondary | ICD-10-CM | POA: Diagnosis not present

## 2019-07-26 DIAGNOSIS — Z1389 Encounter for screening for other disorder: Secondary | ICD-10-CM | POA: Diagnosis not present

## 2019-07-26 DIAGNOSIS — S22050D Wedge compression fracture of T5-T6 vertebra, subsequent encounter for fracture with routine healing: Secondary | ICD-10-CM | POA: Diagnosis not present

## 2019-07-26 DIAGNOSIS — M545 Low back pain: Secondary | ICD-10-CM | POA: Diagnosis not present

## 2019-07-26 DIAGNOSIS — M546 Pain in thoracic spine: Secondary | ICD-10-CM | POA: Diagnosis not present

## 2019-07-26 DIAGNOSIS — M6281 Muscle weakness (generalized): Secondary | ICD-10-CM | POA: Diagnosis not present

## 2019-07-26 DIAGNOSIS — S3210XA Unspecified fracture of sacrum, initial encounter for closed fracture: Secondary | ICD-10-CM | POA: Diagnosis not present

## 2019-07-26 DIAGNOSIS — M47816 Spondylosis without myelopathy or radiculopathy, lumbar region: Secondary | ICD-10-CM | POA: Diagnosis not present

## 2019-07-26 DIAGNOSIS — R202 Paresthesia of skin: Secondary | ICD-10-CM | POA: Diagnosis not present

## 2019-07-31 DIAGNOSIS — M546 Pain in thoracic spine: Secondary | ICD-10-CM | POA: Diagnosis not present

## 2019-07-31 DIAGNOSIS — R5383 Other fatigue: Secondary | ICD-10-CM | POA: Diagnosis not present

## 2019-07-31 DIAGNOSIS — G4733 Obstructive sleep apnea (adult) (pediatric): Secondary | ICD-10-CM | POA: Diagnosis not present

## 2019-07-31 DIAGNOSIS — J4531 Mild persistent asthma with (acute) exacerbation: Secondary | ICD-10-CM | POA: Diagnosis not present

## 2019-07-31 DIAGNOSIS — J301 Allergic rhinitis due to pollen: Secondary | ICD-10-CM | POA: Diagnosis not present

## 2019-07-31 DIAGNOSIS — J189 Pneumonia, unspecified organism: Secondary | ICD-10-CM | POA: Diagnosis not present

## 2019-07-31 DIAGNOSIS — M6281 Muscle weakness (generalized): Secondary | ICD-10-CM | POA: Diagnosis not present

## 2019-08-01 ENCOUNTER — Other Ambulatory Visit: Payer: Self-pay

## 2019-08-01 DIAGNOSIS — G4733 Obstructive sleep apnea (adult) (pediatric): Secondary | ICD-10-CM | POA: Diagnosis not present

## 2019-08-01 DIAGNOSIS — J4531 Mild persistent asthma with (acute) exacerbation: Secondary | ICD-10-CM | POA: Diagnosis not present

## 2019-08-01 DIAGNOSIS — R5383 Other fatigue: Secondary | ICD-10-CM | POA: Diagnosis not present

## 2019-08-01 DIAGNOSIS — J189 Pneumonia, unspecified organism: Secondary | ICD-10-CM | POA: Diagnosis not present

## 2019-08-01 MED ORDER — FAMOTIDINE 40 MG PO TABS: 40.0000 mg | ORAL_TABLET | Freq: Every day | ORAL | 2 refills | Status: DC

## 2019-08-02 DIAGNOSIS — J301 Allergic rhinitis due to pollen: Secondary | ICD-10-CM | POA: Diagnosis not present

## 2019-08-02 DIAGNOSIS — J189 Pneumonia, unspecified organism: Secondary | ICD-10-CM | POA: Diagnosis not present

## 2019-08-02 DIAGNOSIS — R5383 Other fatigue: Secondary | ICD-10-CM | POA: Diagnosis not present

## 2019-08-02 DIAGNOSIS — J4531 Mild persistent asthma with (acute) exacerbation: Secondary | ICD-10-CM | POA: Diagnosis not present

## 2019-08-02 DIAGNOSIS — G4733 Obstructive sleep apnea (adult) (pediatric): Secondary | ICD-10-CM | POA: Diagnosis not present

## 2019-08-03 DIAGNOSIS — J4531 Mild persistent asthma with (acute) exacerbation: Secondary | ICD-10-CM | POA: Diagnosis not present

## 2019-08-03 DIAGNOSIS — R5383 Other fatigue: Secondary | ICD-10-CM | POA: Diagnosis not present

## 2019-08-03 DIAGNOSIS — J189 Pneumonia, unspecified organism: Secondary | ICD-10-CM | POA: Diagnosis not present

## 2019-08-04 DIAGNOSIS — J189 Pneumonia, unspecified organism: Secondary | ICD-10-CM | POA: Diagnosis not present

## 2019-08-04 DIAGNOSIS — J301 Allergic rhinitis due to pollen: Secondary | ICD-10-CM | POA: Diagnosis not present

## 2019-08-04 DIAGNOSIS — G4733 Obstructive sleep apnea (adult) (pediatric): Secondary | ICD-10-CM | POA: Diagnosis not present

## 2019-08-04 DIAGNOSIS — R5383 Other fatigue: Secondary | ICD-10-CM | POA: Diagnosis not present

## 2019-08-04 DIAGNOSIS — J4531 Mild persistent asthma with (acute) exacerbation: Secondary | ICD-10-CM | POA: Diagnosis not present

## 2019-08-07 ENCOUNTER — Ambulatory Visit: Payer: Medicare Other | Admitting: Family Medicine

## 2019-08-15 DIAGNOSIS — M6281 Muscle weakness (generalized): Secondary | ICD-10-CM | POA: Diagnosis not present

## 2019-08-15 DIAGNOSIS — M546 Pain in thoracic spine: Secondary | ICD-10-CM | POA: Diagnosis not present

## 2019-08-17 DIAGNOSIS — M6281 Muscle weakness (generalized): Secondary | ICD-10-CM | POA: Diagnosis not present

## 2019-08-17 DIAGNOSIS — M546 Pain in thoracic spine: Secondary | ICD-10-CM | POA: Diagnosis not present

## 2019-08-21 ENCOUNTER — Other Ambulatory Visit: Payer: Self-pay | Admitting: Family Medicine

## 2019-08-21 DIAGNOSIS — M546 Pain in thoracic spine: Secondary | ICD-10-CM | POA: Diagnosis not present

## 2019-08-21 DIAGNOSIS — M6281 Muscle weakness (generalized): Secondary | ICD-10-CM | POA: Diagnosis not present

## 2019-08-22 NOTE — Progress Notes (Signed)
Subjective:  Patient ID: Ellen Holloway, female    DOB: [birth year]  Age: 69 y.o. MRN: [id number]  Chief Complaint  Patient presents with  . COPD  . Depression  . Gastroesophageal Reflux  . Hyperlipidemia    HPI  Patient is a 69 year old white female with history of COPD and chronic smoking.  She presented early in January with significant weight loss over the prior 4 to 6 months.  Her weight has stablilized almost. She has dropped a little (4 more lbs.) Drinking boost daily. The patient had an extraordinarily difficult year in 2020 due to numerous pneumonias, compression fractures, as well as COVID-19 restrictions. Her low contrast CT scan of her lungs was normal.. Lab work up was normal.  She did have an abnormal mammogram, but a diagnostic mammogram follow up was normal.  Since I saw her in February, she has had pneumonia again which was treated with IV antibiotics in Dr. Terrilee Croak office for 5 days. She is currently using an albuterol inhaler every 6 hours as needed but is off her Breo. She feels like she is doing well without it.  She has not quit smoking, but is down to a 1/2 ppd/  Her depression is well controlled on zoloft 100 mg 2 daily. Taking clonazepam when very anxious which is very infrequent.   Patient suffers from restless leg syndrome she takes Mirapex 1 mg 1 daily at nighttime.   She also has sleep apnea and wears her CPAP combined with her oxygen at 2 L at night. She does not need oxygen at any other times.    She is off her Celebrex for her joint pain, however the pain clinic still has her on Percocet 10/325 mg every 6 hours for compression fractures and back pain.  She is currently still taking spironolactone for swelling and then she uses the Lasix as needed but she rarely needs this. She has not been diagnosed with CHF. Her work up was negative.   GERD- well controlled on pantoprazole and famotidine.   Mixed Hyperlipidemia- Taking lipitor. Eating healthy, but still  has a decreased appetite, but improved. Patient is exercising at physical therapist twice a week, yardwork, swimming in pool.   Osteoporosis - No calcium with vitamin D.   Allergic rhinitis - failed on benadryl, pseudofed, allegra, claritin, and zyrtec.   Current Outpatient Medications on File Prior to Visit  Medication Sig Dispense Refill  . albuterol (PROVENTIL) (2.5 MG/3ML) 0.083% nebulizer solution Take 2.5 mg by nebulization every 6 (six) hours as needed for wheezing or shortness of breath.    [name] atorvastatin (LIPITOR) 10 MG tablet Take 1 tablet (10 mg total) by mouth daily. 90 tablet 0  . clonazePAM (KLONOPIN) 1 MG tablet Take 1 tablet (1 mg total) by mouth 2 (two) times daily as needed for anxiety. 20 tablet 1  . famotidine (PEPCID) 40 MG tablet Take 1 tablet (40 mg total) by mouth at bedtime. 30 tablet 2  . furosemide (LASIX) 20 MG tablet Take 1 tablet (20 mg total) by mouth daily as needed. As needed for swelling 90 tablet 0  . oxyCODONE-acetaminophen (PERCOCET) 10-325 MG tablet Take 1 tablet by mouth every 6 (six) hours.    . OXYGEN Inhale into the lungs. 2 L at night.    . pantoprazole (PROTONIX) 40 MG tablet Take 1 tablet (40 mg total) by mouth 2 (two) times daily. 180 tablet 0  . pramipexole (MIRAPEX) 1 MG tablet TAKE 1 TABLET BY MOUTH AT BEDTIME.  90 tablet 0  . sertraline (ZOLOFT) 100 MG tablet Take 2 tablets (200 mg total) by mouth daily. 180 tablet 0  . spironolactone (ALDACTONE) 25 MG tablet Take 1 tablet (25 mg total) by mouth daily. 90 tablet 0   No current facility-administered medications on file prior to visit.   Social Hx   Social History   Socioeconomic History  . Marital status: Married    Spouse name: Not on file  . Number of children: Not on file  . Years of education: Not on file  . Highest education level: Not on file  Occupational History  . Not on file  Tobacco Use  . Smoking status: Current Every Day Smoker    Packs/day: 0.50    Years: 55.00    Pack  years: 27.50    Types: E-cigarettes  . Smokeless tobacco: Never Used  . Tobacco comment: Patient used to smoke up to 2 PPD.  Substance and Sexual Activity  . Alcohol use: Never  . Drug use: Never  . Sexual activity: Not on file  Other Topics Concern  . Not on file  Social History Narrative  . Not on file   Social Determinants of Health   Financial Resource Strain:   . Difficulty of Paying Living Expenses:   Food Insecurity:   . Worried About Charity fundraiser in the Last Year:   . Arboriculturist in the Last Year:   Transportation Needs:   . Film/video editor (Medical):   [name] Lack of Transportation (Non-Medical):   Physical Activity:   . Days of Exercise per Week:   . Minutes of Exercise per Session:   Stress:   . Feeling of Stress :   Social Connections:   . Frequency of Communication with Friends and Family:   . Frequency of Social Gatherings with Friends and Family:   . Attends Religious Services:   . Active Member of Clubs or Organizations:   . Attends Archivist Meetings:   [name] Marital Status:    Past Medical History:  Diagnosis Date  . Acquired absence of both cervix and uterus   . Age-related osteoporosis with current pathological fracture, unspecified site, initial encounter for fracture   . Barrett's esophagus with low grade dysplasia   . Chronic obstructive pulmonary disease (COPD) ([place])   . Major depressive disorder, recurrent severe without psychotic features ([place])   . Mixed hyperlipidemia   . Obstructive sleep apnea (adult) (pediatric)   . Other obesity due to excess calories   . Other pulmonary embolism without acute cor pulmonale ([place])   . Other specified anxiety disorders   . Sleep related leg cramps   . Telogen effluvium    The patient has a family history of myocardial infarction, renal failure (Wegener's)  Review of Systems  Constitutional: Negative for chills, fatigue and fever.  HENT: Positive for rhinorrhea. Negative for  congestion, ear pain and sore throat.   Respiratory: Positive for shortness of breath (with high exertion.). Negative for cough.   Cardiovascular: Negative for chest pain and palpitations.  Gastrointestinal: Negative for abdominal pain, constipation, diarrhea, nausea and vomiting.  Genitourinary: Negative for dysuria and urgency.  Musculoskeletal: Positive for back pain.  Neurological: Negative for dizziness, weakness and headaches.  Psychiatric/Behavioral: Negative for dysphoric mood and suicidal ideas. The patient is not nervous/anxious.        No anhedonia.     Objective:  BP 132/68   Pulse 76   Temp 97.6 F (36.4  C)   Resp 14   Ht 5\' 6"  (1.676 m)   Wt 121 lb 12.8 oz (55.2 kg)   BMI 19.66 kg/m   BP/Weight 08/23/2019 04/27/2019  Systolic BP 132 120  Diastolic BP 68 64  Wt. (Lbs) 121.8 132.2  BMI 19.66 22.69    Physical Exam Constitutional:      Appearance: Normal appearance.  Neck:     Vascular: No carotid bruit.  Cardiovascular:     Rate and Rhythm: Normal rate and regular rhythm.  Pulmonary:     Effort: Pulmonary effort is normal.     Breath sounds: Normal breath sounds.  Abdominal:     General: Bowel sounds are normal.     Palpations: Abdomen is soft.     Tenderness: There is no abdominal tenderness.  Musculoskeletal:     Comments: Tender over thoracic vertebrae.  Neurological:     Mental Status: She is alert and oriented to person, place, and time.  Psychiatric:        Mood and Affect: Mood normal.        Behavior: Behavior normal.    Assessment & Plan:  1. Mixed hyperlipidemia Well controlled.  No changes to medicines.  Continue to work on eating a healthy diet and exercise.  Labs drawn today.  - CBC with Differential/Platelet - Comprehensive metabolic panel - Lipid panel - Ambulatory referral to Chronic Care Management Services - Cardiovascular Risk Assessment  2. Depression, major, recurrent, mild (HCC) The current medical regimen is effective;   continue present plan and medications.  3. Other emphysema (HCC) The current medical regimen is effective;  continue present plan and medications. Recommend fully quit smoking!  4. Abnormal weight loss Continue to eat 3 meals per day and one boost daily.  5. Age-related osteoporosis with current pathological fracture with routine healing, subsequent encounter  - VITAMIN D 25 Hydroxy (Vit-D Deficiency, Fractures); Future  6. Seasonal allergic rhinitis due to pollen Start singulair. May add in an antihistamine (claritin, allegra, or zyrtec) if needed. - montelukast (SINGULAIR) 10 MG tablet; Take 1 tablet (10 mg total) by mouth at bedtime.  Dispense: 30 tablet; Refill: 3  7. RLS (restless legs syndrome) The current medical regimen is effective;  continue present plan and medications.  8. OSA on CPAP Compliance with use of cpap.  9. Chronic midline low back pain without sciatica Due to compression fractures. Managed by pain clinic.  Meds ordered this encounter  Medications  . cyclobenzaprine (FLEXERIL) 10 MG tablet    Sig: Take 1 tablet (10 mg total) by mouth at bedtime.    Dispense:  30 tablet    Refill:  2  . montelukast (SINGULAIR) 10 MG tablet    Sig: Take 1 tablet (10 mg total) by mouth at bedtime.    Dispense:  30 tablet    Refill:  3   Follow-up: Return in about 3 months (around 11/23/2019) for fasting.  A CLINICAL SUMMARY including a written plan identify barriers to care unique to individual due to social or financial issues and help create solutions together. and a patient's and the patient's families understanding of their medical issues and care needs   01/23/2020 [facility] [phone]

## 2019-08-23 ENCOUNTER — Ambulatory Visit (INDEPENDENT_AMBULATORY_CARE_PROVIDER_SITE_OTHER): Payer: Medicare Other | Admitting: Family Medicine

## 2019-08-23 ENCOUNTER — Encounter: Payer: Self-pay | Admitting: Family Medicine

## 2019-08-23 ENCOUNTER — Other Ambulatory Visit: Payer: Self-pay

## 2019-08-23 ENCOUNTER — Telehealth: Payer: Self-pay | Admitting: Family Medicine

## 2019-08-23 VITALS — BP 132/68 | HR 76 | Temp 97.6°F | Resp 14 | Ht 66.0 in | Wt 121.8 lb

## 2019-08-23 DIAGNOSIS — E782 Mixed hyperlipidemia: Secondary | ICD-10-CM | POA: Diagnosis not present

## 2019-08-23 DIAGNOSIS — J301 Allergic rhinitis due to pollen: Secondary | ICD-10-CM | POA: Diagnosis not present

## 2019-08-23 DIAGNOSIS — R634 Abnormal weight loss: Secondary | ICD-10-CM | POA: Diagnosis not present

## 2019-08-23 DIAGNOSIS — Z9989 Dependence on other enabling machines and devices: Secondary | ICD-10-CM | POA: Diagnosis not present

## 2019-08-23 DIAGNOSIS — G2581 Restless legs syndrome: Secondary | ICD-10-CM

## 2019-08-23 DIAGNOSIS — G8929 Other chronic pain: Secondary | ICD-10-CM

## 2019-08-23 DIAGNOSIS — M545 Low back pain: Secondary | ICD-10-CM

## 2019-08-23 DIAGNOSIS — M8000XD Age-related osteoporosis with current pathological fracture, unspecified site, subsequent encounter for fracture with routine healing: Secondary | ICD-10-CM | POA: Diagnosis not present

## 2019-08-23 DIAGNOSIS — J438 Other emphysema: Secondary | ICD-10-CM | POA: Diagnosis not present

## 2019-08-23 DIAGNOSIS — F330 Major depressive disorder, recurrent, mild: Secondary | ICD-10-CM | POA: Diagnosis not present

## 2019-08-23 DIAGNOSIS — G4733 Obstructive sleep apnea (adult) (pediatric): Secondary | ICD-10-CM | POA: Diagnosis not present

## 2019-08-23 DIAGNOSIS — E785 Hyperlipidemia, unspecified: Secondary | ICD-10-CM | POA: Diagnosis not present

## 2019-08-23 MED ORDER — MONTELUKAST SODIUM 10 MG PO TABS: 10.0000 mg | ORAL_TABLET | Freq: Every day | ORAL | 3 refills | Status: DC

## 2019-08-23 MED ORDER — CYCLOBENZAPRINE HCL 10 MG PO TABS: 10.0000 mg | ORAL_TABLET | Freq: Every day | ORAL | 2 refills | Status: DC

## 2019-08-23 NOTE — Patient Instructions (Signed)
Restart singulair 10 mg once daily for allergy  Recommend Calcium citrate with Vitamin D 1200-1500 mg once daily.

## 2019-08-23 NOTE — Progress Notes (Signed)
  Chronic Care Management   Note  08/23/2019 Name: HARVEY LINGO MRN: [id number] DOB: 1950-05-10  RAWAN RIENDEAU is a 70 y.o. year old female who is a primary care patient of Cox, Kirsten, MD. I reached out to [name] by phone today in response to a referral sent by Ms. Haydee Monica Tribbey's PCP, Blane Ohara, MD.   Ms. Montesano was given information about Chronic Care Management services today including:  1. CCM service includes personalized support from designated clinical staff supervised by her physician, including individualized plan of care and coordination with other care providers 2. 24/7 contact phone numbers for assistance for urgent and routine care needs. 3. Service will only be billed when office clinical staff spend 20 minutes or more in a month to coordinate care. 4. Only one practitioner may furnish and bill the service in a calendar month. 5. The patient may stop CCM services at any time (effective at the end of the month) by phone call to the office staff.   Patient agreed to services and verbal consent obtained.   This note is not being shared with the patient for the following reason: To respect privacy (The patient or proxy has requested that the information not be shared).  Follow up plan:   Lynnae January [facility] Scheduler

## 2019-08-24 LAB — COMPREHENSIVE METABOLIC PANEL
ALT: 18 IU/L (ref 0–32)
AST: 23 IU/L (ref 0–40)
Albumin/Globulin Ratio: 1.9 (ref 1.2–2.2)
Albumin: 4.2 g/dL (ref 3.8–4.8)
Alkaline Phosphatase: 108 IU/L (ref 48–121)
BUN/Creatinine Ratio: 15 (ref 12–28)
BUN: 13 mg/dL (ref 8–27)
Bilirubin Total: 0.3 mg/dL (ref 0.0–1.2)
CO2: 27 mmol/L (ref 20–29)
Calcium: 9.6 mg/dL (ref 8.7–10.3)
Chloride: 100 mmol/L (ref 96–106)
Creatinine, Ser: 0.84 mg/dL (ref 0.57–1.00)
GFR calc Af Amer: 81 mL/min/{1.73_m2} (ref >59)
GFR calc non Af Amer: 71 mL/min/{1.73_m2} (ref >59)
Globulin, Total: 2.2 g/dL (ref 1.5–4.5)
Glucose: 93 mg/dL (ref 65–99)
Potassium: 4.7 mmol/L (ref 3.5–5.2)
Sodium: 140 mmol/L (ref 134–144)
Total Protein: 6.4 g/dL (ref 6.0–8.5)

## 2019-08-24 LAB — CBC WITH DIFFERENTIAL/PLATELET
Basophils Absolute: 0 10*3/uL (ref 0.0–0.2)
Basos: 1 %
EOS (ABSOLUTE): 0.2 10*3/uL (ref 0.0–0.4)
Eos: 3 %
Hematocrit: 41 % (ref 34.0–46.6)
Hemoglobin: 13.5 g/dL (ref 11.1–15.9)
Immature Grans (Abs): 0 10*3/uL (ref 0.0–0.1)
Immature Granulocytes: 0 %
Lymphocytes Absolute: 2.1 10*3/uL (ref 0.7–3.1)
Lymphs: 37 %
MCH: 31 pg (ref 26.6–33.0)
MCHC: 32.9 g/dL (ref 31.5–35.7)
MCV: 94 fL (ref 79–97)
Monocytes Absolute: 0.7 10*3/uL (ref 0.1–0.9)
Monocytes: 12 %
Neutrophils Absolute: 2.7 10*3/uL (ref 1.4–7.0)
Neutrophils: 47 %
Platelets: 175 10*3/uL (ref 150–450)
RBC: 4.35 x10E6/uL (ref 3.77–5.28)
RDW: 12.4 % (ref 11.7–15.4)
WBC: 5.7 10*3/uL (ref 3.4–10.8)

## 2019-08-24 LAB — LIPID PANEL
Chol/HDL Ratio: 2.1 ratio (ref 0.0–4.4)
Cholesterol, Total: 156 mg/dL (ref 100–199)
HDL: 74 mg/dL (ref >39)
LDL Chol Calc (NIH): 64 mg/dL (ref 0–99)
Triglycerides: 101 mg/dL (ref 0–149)
VLDL Cholesterol Cal: 18 mg/dL (ref 5–40)

## 2019-08-24 LAB — CARDIOVASCULAR RISK ASSESSMENT

## 2019-08-25 DIAGNOSIS — Z1389 Encounter for screening for other disorder: Secondary | ICD-10-CM | POA: Diagnosis not present

## 2019-08-25 DIAGNOSIS — S3210XA Unspecified fracture of sacrum, initial encounter for closed fracture: Secondary | ICD-10-CM | POA: Diagnosis not present

## 2019-08-25 DIAGNOSIS — M5416 Radiculopathy, lumbar region: Secondary | ICD-10-CM | POA: Diagnosis not present

## 2019-08-25 DIAGNOSIS — M546 Pain in thoracic spine: Secondary | ICD-10-CM | POA: Diagnosis not present

## 2019-08-25 DIAGNOSIS — M545 Low back pain: Secondary | ICD-10-CM | POA: Diagnosis not present

## 2019-08-25 DIAGNOSIS — M47816 Spondylosis without myelopathy or radiculopathy, lumbar region: Secondary | ICD-10-CM | POA: Diagnosis not present

## 2019-08-25 DIAGNOSIS — G894 Chronic pain syndrome: Secondary | ICD-10-CM | POA: Diagnosis not present

## 2019-08-25 DIAGNOSIS — R202 Paresthesia of skin: Secondary | ICD-10-CM | POA: Diagnosis not present

## 2019-08-25 DIAGNOSIS — S22050D Wedge compression fracture of T5-T6 vertebra, subsequent encounter for fracture with routine healing: Secondary | ICD-10-CM | POA: Diagnosis not present

## 2019-08-27 DIAGNOSIS — Z9989 Dependence on other enabling machines and devices: Secondary | ICD-10-CM | POA: Insufficient documentation

## 2019-08-27 DIAGNOSIS — G2581 Restless legs syndrome: Secondary | ICD-10-CM | POA: Insufficient documentation

## 2019-08-27 DIAGNOSIS — G4733 Obstructive sleep apnea (adult) (pediatric): Secondary | ICD-10-CM | POA: Insufficient documentation

## 2019-08-27 DIAGNOSIS — M545 Low back pain, unspecified: Secondary | ICD-10-CM | POA: Insufficient documentation

## 2019-08-27 DIAGNOSIS — J301 Allergic rhinitis due to pollen: Secondary | ICD-10-CM | POA: Insufficient documentation

## 2019-08-27 DIAGNOSIS — G8929 Other chronic pain: Secondary | ICD-10-CM | POA: Insufficient documentation

## 2019-08-29 DIAGNOSIS — M898X1 Other specified disorders of bone, shoulder: Secondary | ICD-10-CM | POA: Diagnosis not present

## 2019-08-29 DIAGNOSIS — M546 Pain in thoracic spine: Secondary | ICD-10-CM | POA: Diagnosis not present

## 2019-08-29 DIAGNOSIS — S22000A Wedge compression fracture of unspecified thoracic vertebra, initial encounter for closed fracture: Secondary | ICD-10-CM | POA: Diagnosis not present

## 2019-08-30 ENCOUNTER — Ambulatory Visit (INDEPENDENT_AMBULATORY_CARE_PROVIDER_SITE_OTHER): Payer: Medicare Other

## 2019-08-30 ENCOUNTER — Other Ambulatory Visit: Payer: Self-pay

## 2019-08-30 VITALS — BP 118/64 | HR 78 | Temp 93.0°F | Resp 16 | Ht 64.0 in | Wt 122.0 lb

## 2019-08-30 DIAGNOSIS — M546 Pain in thoracic spine: Secondary | ICD-10-CM | POA: Diagnosis not present

## 2019-08-30 DIAGNOSIS — M6281 Muscle weakness (generalized): Secondary | ICD-10-CM | POA: Diagnosis not present

## 2019-08-30 DIAGNOSIS — Z0000 Encounter for general adult medical examination without abnormal findings: Secondary | ICD-10-CM | POA: Diagnosis not present

## 2019-08-31 ENCOUNTER — Telehealth: Payer: Self-pay

## 2019-08-31 DIAGNOSIS — M8000XS Age-related osteoporosis with current pathological fracture, unspecified site, sequela: Secondary | ICD-10-CM

## 2019-08-31 MED ORDER — DENOSUMAB 60 MG/ML ~~LOC~~ SOSY: 60.0000 mg | PREFILLED_SYRINGE | Freq: Once | SUBCUTANEOUS | 0 refills | Status: AC

## 2019-08-31 NOTE — Patient Instructions (Signed)
 Fall Prevention in the Home, Adult Falls can cause injuries. They can happen to people of all ages. There are many things you can do to make your home safe and to help prevent falls. Ask for help when making these changes, if needed. What actions can I take to prevent falls? General Instructions  Use good lighting in all rooms. Replace any light bulbs that burn out.  Turn on the lights when you go into a dark area. Use night-lights.  Keep items that you use often in easy-to-reach places. Lower the shelves around your home if necessary.  Set up your furniture so you have a clear path. Avoid moving your furniture around.  Do not have throw rugs and other things on the floor that can make you trip.  Avoid walking on wet floors.  If any of your floors are uneven, fix them.  Add color or contrast paint or tape to clearly mark and help you see: ? Any grab bars or handrails. ? First and last steps of stairways. ? Where the edge of each step is.  If you use a stepladder: ? Make sure that it is fully opened. Do not climb a closed stepladder. ? Make sure that both sides of the stepladder are locked into place. ? Ask someone to hold the stepladder for you while you use it.  If there are any pets around you, be aware of where they are. What can I do in the bathroom?      Keep the floor dry. Clean up any water that spills onto the floor as soon as it happens.  Remove soap buildup in the tub or shower regularly.  Use non-skid mats or decals on the floor of the tub or shower.  Attach bath mats securely with double-sided, non-slip rug tape.  If you need to sit down in the shower, use a plastic, non-slip stool.  Install grab bars by the toilet and in the tub and shower. Do not use towel bars as grab bars. What can I do in the bedroom?  Make sure that you have a light by your bed that is easy to reach.  Do not use any sheets or blankets that are too big for your bed. They should  not hang down onto the floor.  Have a firm chair that has side arms. You can use this for support while you get dressed. What can I do in the kitchen?  Clean up any spills right away.  If you need to reach something above you, use a strong step stool that has a grab bar.  Keep electrical cords out of the way.  Do not use floor polish or wax that makes floors slippery. If you must use wax, use non-skid floor wax. What can I do with my stairs?  Do not leave any items on the stairs.  Make sure that you have a light switch at the top of the stairs and the bottom of the stairs. If you do not have them, ask someone to add them for you.  Make sure that there are handrails on both sides of the stairs, and use them. Fix handrails that are broken or loose. Make sure that handrails are as long as the stairways.  Install non-slip stair treads on all stairs in your home.  Avoid having throw rugs at the top or bottom of the stairs. If you do have throw rugs, attach them to the floor with carpet tape.  Choose a carpet that   does not hide the edge of the steps on the stairway.  Check any carpeting to make sure that it is firmly attached to the stairs. Fix any carpet that is loose or worn. What can I do on the outside of my home?  Use bright outdoor lighting.  Regularly fix the edges of walkways and driveways and fix any cracks.  Remove anything that might make you trip as you walk through a door, such as a raised step or threshold.  Trim any bushes or trees on the path to your home.  Regularly check to see if handrails are loose or broken. Make sure that both sides of any steps have handrails.  Install guardrails along the edges of any raised decks and porches.  Clear walking paths of anything that might make someone trip, such as tools or rocks.  Have any leaves, snow, or ice cleared regularly.  Use sand or salt on walking paths during winter.  Clean up any spills in your garage right  away. This includes grease or oil spills. What other actions can I take?  Wear shoes that: ? Have a low heel. Do not wear high heels. ? Have rubber bottoms. ? Are comfortable and fit you well. ? Are closed at the toe. Do not wear open-toe sandals.  Use tools that help you move around (mobility aids) if they are needed. These include: ? Canes. ? Walkers. ? Scooters. ? Crutches.  Review your medicines with your doctor. Some medicines can make you feel dizzy. This can increase your chance of falling. Ask your doctor what other things you can do to help prevent falls. Where to find more information  Centers for Disease Control and Prevention, STEAD[employer]: https://cdc.gov  National Institute on Aging: https://go4life.nia.nih.gov Contact a doctor if:  You are afraid of falling at home.  You feel weak, drowsy, or dizzy at home.  You fall at home. Summary  There are many simple things that you can do to make your home safe and to help prevent falls.  Ways to make your home safe include removing tripping hazards and installing grab bars in the bathroom.  Ask for help when making these changes in your home. This information is not intended to replace advice given to you by your health care provider. Make sure you discuss any questions you have with your health care provider. Document Revised: 06/23/2018 Document Reviewed: 10/15/2016 Elsevier Patient Education  2020 Elsevier Inc.   Health Maintenance, Female Adopting a healthy lifestyle and getting preventive care are important in promoting health and wellness. Ask your health care provider about:  The right schedule for you to have regular tests and exams.  Things you can do on your own to prevent diseases and keep yourself healthy. What should I know about diet, weight, and exercise? Eat a healthy diet   Eat a diet that includes plenty of vegetables, fruits, low-fat dairy products, and lean protein.  Do not eat a lot of foods  that are high in solid fats, added sugars, or sodium. Maintain a healthy weight Body mass index (BMI) is used to identify weight problems. It estimates body fat based on height and weight. Your health care provider can help determine your BMI and help you achieve or maintain a healthy weight. Get regular exercise Get regular exercise. This is one of the most important things you can do for your health. Most adults should:  Exercise for at least 150 minutes each week. The exercise should increase your heart rate   and make you sweat (moderate-intensity exercise).  Do strengthening exercises at least twice a week. This is in addition to the moderate-intensity exercise.  Spend less time sitting. Even light physical activity can be beneficial. Watch cholesterol and blood lipids Have your blood tested for lipids and cholesterol at 70 years of age, then have this test every 5 years. Have your cholesterol levels checked more often if:  Your lipid or cholesterol levels are high.  You are older than 70 years of age.  You are at high risk for heart disease. What should I know about cancer screening? Depending on your health history and family history, you may need to have cancer screening at various ages. This may include screening for:  Breast cancer.  Cervical cancer.  Colorectal cancer.  Skin cancer.  Lung cancer. What should I know about heart disease, diabetes, and high blood pressure? Blood pressure and heart disease  High blood pressure causes heart disease and increases the risk of stroke. This is more likely to develop in people who have high blood pressure readings, are of African descent, or are overweight.  Have your blood pressure checked: ? Every 3-5 years if you are 18-39 years of age. ? Every year if you are 40 years old or older. Diabetes Have regular diabetes screenings. This checks your fasting blood sugar level. Have the screening done:  Once every three years after  age 40 if you are at a normal weight and have a low risk for diabetes.  More often and at a younger age if you are overweight or have a high risk for diabetes. What should I know about preventing infection? Hepatitis B If you have a higher risk for hepatitis B, you should be screened for this virus. Talk with your health care provider to find out if you are at risk for hepatitis B infection. Hepatitis C Testing is recommended for:  Everyone born from 1945 through 1965.  Anyone with known risk factors for hepatitis C. Sexually transmitted infections (STIs)  Get screened for STIs, including gonorrhea and chlamydia, if: ? You are sexually active and are younger than 70 years of age. ? You are older than 70 years of age and your health care provider tells you that you are at risk for this type of infection. ? Your sexual activity has changed since you were last screened, and you are at increased risk for chlamydia or gonorrhea. Ask your health care provider if you are at risk.  Ask your health care provider about whether you are at high risk for HIV. Your health care provider may recommend a prescription medicine to help prevent HIV infection. If you choose to take medicine to prevent HIV, you should first get tested for HIV. You should then be tested every 3 months for as long as you are taking the medicine. Pregnancy  If you are about to stop having your period (premenopausal) and you may become pregnant, seek counseling before you get pregnant.  Take 400 to 800 micrograms (mcg) of folic acid every day if you become pregnant.  Ask for birth control (contraception) if you want to prevent pregnancy. Osteoporosis and menopause Osteoporosis is a disease in which the bones lose minerals and strength with aging. This can result in bone fractures. If you are 65 years old or older, or if you are at risk for osteoporosis and fractures, ask your health care provider if you should:  Be screened for  bone loss.  Take a calcium or vitamin   D supplement to lower your risk of fractures.  Be given hormone replacement therapy (HRT) to treat symptoms of menopause. Follow these instructions at home: Lifestyle  Do not use any products that contain nicotine or tobacco, such as cigarettes, e-cigarettes, and chewing tobacco. If you need help quitting, ask your health care provider.  Do not use street drugs.  Do not share needles.  Ask your health care provider for help if you need support or information about quitting drugs. Alcohol use  Do not drink alcohol if: ? Your health care provider tells you not to drink. ? You are pregnant, may be pregnant, or are planning to become pregnant.  If you drink alcohol: ? Limit how much you use to 0-1 drink a day. ? Limit intake if you are breastfeeding.  Be aware of how much alcohol is in your drink. In the [place] one drink equals one 12 oz bottle of beer (355 mL), one 5 oz glass of wine (148 mL), or one 1 oz glass of hard liquor (44 mL). General instructions  Schedule regular health, dental, and eye exams.  Stay current with your vaccines.  Tell your health care provider if: ? You often feel depressed. ? You have ever been abused or do not feel safe at home. Summary  Adopting a healthy lifestyle and getting preventive care are important in promoting health and wellness.  Follow your health care provider's instructions about healthy diet, exercising, and getting tested or screened for diseases.  Follow your health care provider's instructions on monitoring your cholesterol and blood pressure. This information is not intended to replace advice given to you by your health care provider. Make sure you discuss any questions you have with your health care provider. Document Revised: 02/23/2018 Document Reviewed: 02/23/2018 Elsevier Patient Education  2020 Elsevier Inc.  

## 2019-08-31 NOTE — Progress Notes (Signed)
Subjective:   Ellen Holloway is a 69 y.o. female who presents for Medicare Annual (Subsequent) preventive examination.  This wellness visit is conducted by a nurse.  The patient's medications were reviewed and reconciled since the patient's last visit.  History details were provided by the patient.  The history appears to be reliable.    Patient's last AWV was one year ago.   Medical History: Patient history and Family history was reviewed  Medications, Allergies, and preventative health maintenance was reviewed and updated.  Review of Systems:  Review of Systems  Constitutional: Negative.   HENT: Negative.   Eyes: Negative.   Respiratory: Negative.  Negative for cough, chest tightness and shortness of breath.   Cardiovascular: Negative.  Negative for chest pain and palpitations.  Gastrointestinal: Negative.   Endocrine: Negative.   Genitourinary: Negative.   Musculoskeletal: Positive for arthralgias and back pain.  Neurological: Negative.  Negative for dizziness, weakness and headaches.  Psychiatric/Behavioral: Negative for confusion and suicidal ideas. The patient is nervous/anxious.    Cardiac Risk Factors include: advanced age (>71men, >7 women);smoking/ tobacco exposure     Objective:     Vitals: BP 118/64 (BP Location: Left Arm, Patient Position: Sitting, Cuff Size: Large)    Pulse 78    Temp (!) 93 F (33.9 C) (Temporal)    Resp 16    Ht 5\' 4"  (1.626 m)    Wt 122 lb (55.3 kg)    SpO2 93%    BMI 20.94 kg/m   Body mass index is 20.94 kg/m.  Advanced Directives 08/30/2019  Does Patient Have a Medical Advance Directive? No  Would patient like information on creating a medical advance directive? No - Patient declined    Tobacco Social History   Tobacco Use  Smoking Status Current Every Day Smoker   Packs/day: 0.50   Years: 55.00   Pack years: 27.50   Types: E-cigarettes  Smokeless Tobacco Never Used  Tobacco Comment   Patient used to smoke up to 2 PPD.       Ready to quit: No Counseling given: Yes Comment: Patient used to smoke up to 2 PPD.   Clinical Intake:  Pre-visit preparation completed: Yes  Pain : 0-10 Pain Score: 6  Pain Type: Chronic pain Pain Location: Back Pain Frequency: Constant Pain Relieving Factors: Percoset  Pain Relieving Factors: Percoset  Diabetes: No  How often do you need to have someone help you when you read instructions, pamphlets, or other written materials from your doctor or pharmacy?: 1 - Never  Interpreter Needed?: No     Past Medical History:  Diagnosis Date   Acquired absence of both cervix and uterus    Age-related osteoporosis with current pathological fracture, unspecified site, initial encounter for fracture    Barrett's esophagus with low grade dysplasia    Chronic obstructive pulmonary disease (COPD) (HCC)    Major depressive disorder, recurrent severe without psychotic features (HCC)    Mixed hyperlipidemia    Obstructive sleep apnea (adult) (pediatric)    Other obesity due to excess calories    Other pulmonary embolism without acute cor pulmonale (HCC)    Other specified anxiety disorders    Sleep related leg cramps    Telogen effluvium    Past Surgical History:  Procedure Laterality Date   ABDOMINAL HYSTERECTOMY  1980   partial   right wrist 1985     Family History  Problem Relation Age of Onset   Obesity Mother    Heart attack  Father    Renal Disease Brother    Social History   Socioeconomic History   Marital status: Married    Spouse name: Not on file   Number of children: Not on file   Years of education: Not on file   Highest education level: Not on file  Occupational History   Not on file  Tobacco Use   Smoking status: Current Every Day Smoker    Packs/day: 0.50    Years: 55.00    Pack years: 27.50    Types: E-cigarettes   Smokeless tobacco: Never Used   Tobacco comment: Patient used to smoke up to 2 PPD.  Substance and Sexual  Activity   Alcohol use: Never   Drug use: Never   Sexual activity: Not on file    Outpatient Encounter Medications as of 08/30/2019  Medication Sig   albuterol (PROVENTIL) (2.5 MG/3ML) 0.083% nebulizer solution Take 2.5 mg by nebulization every 6 (six) hours as needed for wheezing or shortness of breath.   atorvastatin (LIPITOR) 10 MG tablet Take 1 tablet (10 mg total) by mouth daily.   clonazePAM (KLONOPIN) 1 MG tablet Take 1 tablet (1 mg total) by mouth 2 (two) times daily as needed for anxiety.   cyclobenzaprine (FLEXERIL) 10 MG tablet Take 1 tablet (10 mg total) by mouth at bedtime.   famotidine (PEPCID) 40 MG tablet Take 1 tablet (40 mg total) by mouth at bedtime.   furosemide (LASIX) 20 MG tablet Take 1 tablet (20 mg total) by mouth daily as needed. As needed for swelling   montelukast (SINGULAIR) 10 MG tablet Take 1 tablet (10 mg total) by mouth at bedtime.   oxyCODONE-acetaminophen (PERCOCET) 10-325 MG tablet Take 1 tablet by mouth every 6 (six) hours.   OXYGEN Inhale into the lungs. 2 L at night.   pantoprazole (PROTONIX) 40 MG tablet Take 1 tablet (40 mg total) by mouth 2 (two) times daily.   pramipexole (MIRAPEX) 1 MG tablet TAKE 1 TABLET BY MOUTH AT BEDTIME.   sertraline (ZOLOFT) 100 MG tablet Take 2 tablets (200 mg total) by mouth daily.   spironolactone (ALDACTONE) 25 MG tablet Take 1 tablet (25 mg total) by mouth daily.   No facility-administered encounter medications on file as of 08/30/2019.    Activities of Daily Living In your present state of health, do you have any difficulty performing the following activities: 08/30/2019  Hearing? N  Vision? N  Difficulty concentrating or making decisions? N  Walking or climbing stairs? Y  Comment due to back pain  Dressing or bathing? N  Doing errands, shopping? N  Preparing Food and eating ? N  Using the Toilet? N  In the past six months, have you accidently leaked urine? N  Do you have problems with loss of  bowel control? N  Managing your Medications? N  Managing your Finances? N  Housekeeping or managing your Housekeeping? N  Some recent data might be hidden    Patient Care Team: Rochel Brome, MD as PCP - General (Family Medicine) Burnice Logan, [facility] as Pharmacist (Pharmacist)    Assessment:   This is a routine wellness examination for Ellen Holloway.  Exercise Activities and Dietary recommendations Current Exercise Habits: The patient does not participate in regular exercise at present, Exercise limited by: orthopedic condition(s)  Goals   None     Fall Risk Fall Risk  08/30/2019 08/23/2019 01/31/2018 10/02/2016  Falls in the past year? 1 0 0 Yes  Comment - - Emmi Telephone Survey:  data to providers prior to load Emmi Telephone Survey: data to providers prior to load  Number falls in past yr: 1 0 - 2 or more  Comment - - - Emmi Telephone Survey Actual Response = 2  Injury with Fall? 1 - - Yes  Risk for fall due to : History of fall(s) - - -  Follow up Falls evaluation completed;Falls prevention discussed;Education provided - - -   Is the patient's home free of loose throw rugs in walkways, pet beds, electrical cords, etc?   yes      Grab bars in the bathroom? no      Handrails on the stairs?   yes      Adequate lighting?   yes   Depression Screen PHQ 2/9 Scores 08/31/2019  PHQ - 2 Score 1     Cognitive Function     6CIT Screen 08/31/2019  What Year? 0 points  What month? 0 points  What time? 0 points  Count back from 20 0 points  Months in reverse 2 points  Repeat phrase 2 points  Total Score 4    Immunization History  Administered Date(s) Administered   Influenza-Unspecified 12/05/2018   Moderna SARS-COVID-2 Vaccination 05/17/2019, 06/14/2019   Pneumococcal Polysaccharide-23 12/22/2015   Tdap 06/30/2017    Screening Tests Health Maintenance  Topic Date Due   PNA vac Low Risk Adult (2 of 2 - PCV13) 12/21/2016   COLONOSCOPY  09/11/2020 (Originally 04/26/1999)    INFLUENZA VACCINE  10/15/2019   MAMMOGRAM  03/14/2020   DEXA SCAN  03/14/2021   TETANUS/TDAP  07/01/2027   COVID-19 Vaccine  Completed    Cancer Screenings: Lung: Low Dose CT Chest recommended if Age 19-80 years, 30 pack-year currently smoking OR have quit w/in 15years. Patient declines. Breast:  Up to date on Mammogram? Yes   Up to date of Bone Density/Dexa? Yes Colorectal: Cologuard done 2019     Plan:    Counseling was provided today regarding the following topics: healthy eating habits, home safety, vitamin and mineral supplementation (calcium and Vit D), regular exercise, breast self-exam, tobacco avoidance, limitation of alcohol intake, use of seat belts, firearm safety, and fall prevention.  Annual recommendations include: influenza vaccine, dental cleanings, and eye exams.  Continue with pain management, ortho, and PT for back pain  Confirm with Pulmonology Dr and pharmacy that you have had the Prevnar 13 Vac.  Continue Vit D + Calcium for Osteopenia   I have personally reviewed and noted the following in the patients chart:    Medical and social history  Use of alcohol, tobacco or illicit drugs   Current medications and supplements  Functional ability and status  Nutritional status  Physical activity  Advanced directives  List of other physicians  Hospitalizations, surgeries, and ER visits in previous 12 months  Vitals  Screenings to include cognitive, depression, and falls  Referrals and appointments  In addition, I have reviewed and discussed with patient certain preventive protocols, quality metrics, and best practice recommendations. A written personalized care plan for preventive services as well as general preventive health recommendations were provided to patient.     Jacklynn Bue, LPN  [id number]

## 2019-08-31 NOTE — Telephone Encounter (Signed)
      Patient was in the office for a Medicare AWV yesterday and she expressed a great concern for the need of the Prolia Injection due to her multiple compression fractures.  I submitted the request thru the [facility] Assist portal on 08/30/19.  Coverage is available with no authorization required.  I reached out to the patient today to let her know.  She seemed agitated toward me because she states that she reached out to [employer] and they stated that this had been approved since February.  To my knowledge this is our first attempt to get this injection approved.  Patient insisted we send the order for Prolia into [facility] [facility]   Creola Corn, LPN 38/93/73 4:28 PM

## 2019-09-05 ENCOUNTER — Other Ambulatory Visit: Payer: Self-pay | Admitting: Physician Assistant

## 2019-09-11 DIAGNOSIS — M546 Pain in thoracic spine: Secondary | ICD-10-CM | POA: Diagnosis not present

## 2019-09-11 DIAGNOSIS — M6281 Muscle weakness (generalized): Secondary | ICD-10-CM | POA: Diagnosis not present

## 2019-09-22 DIAGNOSIS — M545 Low back pain: Secondary | ICD-10-CM | POA: Diagnosis not present

## 2019-09-22 DIAGNOSIS — S22050D Wedge compression fracture of T5-T6 vertebra, subsequent encounter for fracture with routine healing: Secondary | ICD-10-CM | POA: Diagnosis not present

## 2019-09-22 DIAGNOSIS — G894 Chronic pain syndrome: Secondary | ICD-10-CM | POA: Diagnosis not present

## 2019-09-22 DIAGNOSIS — Z1389 Encounter for screening for other disorder: Secondary | ICD-10-CM | POA: Diagnosis not present

## 2019-09-22 DIAGNOSIS — M546 Pain in thoracic spine: Secondary | ICD-10-CM | POA: Diagnosis not present

## 2019-09-22 DIAGNOSIS — S3210XA Unspecified fracture of sacrum, initial encounter for closed fracture: Secondary | ICD-10-CM | POA: Diagnosis not present

## 2019-09-22 DIAGNOSIS — M5416 Radiculopathy, lumbar region: Secondary | ICD-10-CM | POA: Diagnosis not present

## 2019-09-22 DIAGNOSIS — M47816 Spondylosis without myelopathy or radiculopathy, lumbar region: Secondary | ICD-10-CM | POA: Diagnosis not present

## 2019-09-22 DIAGNOSIS — R202 Paresthesia of skin: Secondary | ICD-10-CM | POA: Diagnosis not present

## 2019-09-30 DIAGNOSIS — G4733 Obstructive sleep apnea (adult) (pediatric): Secondary | ICD-10-CM | POA: Diagnosis not present

## 2019-10-03 DIAGNOSIS — J4530 Mild persistent asthma, uncomplicated: Secondary | ICD-10-CM | POA: Diagnosis not present

## 2019-10-03 DIAGNOSIS — R5383 Other fatigue: Secondary | ICD-10-CM | POA: Diagnosis not present

## 2019-10-03 DIAGNOSIS — J301 Allergic rhinitis due to pollen: Secondary | ICD-10-CM | POA: Diagnosis not present

## 2019-10-03 DIAGNOSIS — G4733 Obstructive sleep apnea (adult) (pediatric): Secondary | ICD-10-CM | POA: Diagnosis not present

## 2019-10-11 NOTE — Chronic Care Management (AMB) (Signed)
Chronic Care Management Pharmacy  Name: NIMISHA RATHEL  MRN: [id number] DOB: [birth year]  Chief Complaint/ HPI  Ellen Holloway,  69 y.o. , female presents for their Initial CCM visit with the clinical pharmacist via telephone due to COVID-19 Pandemic.  PCP : Rochel Brome, MD  Their chronic conditions include: COPD, Seasonal allergic rhinitis, Osteoporosis, Depression, Dyslipidemia, RLS, Barrett's esophagus, depression.   Office Visits: 06/16/201 - [facility]V.and [facility] to [facility]  08/23/2019 - Recommend calcium with vitamin D. Restart montelukast  05/22/2019 - Chest x-ray showed nodules. Repeat in 6 months. Atherosclerosis visualized.  04/27/2019 - Recommend smoking cessation. Ordered CT scan of lungs. Encouraged boost daily and 3 meals daily. Increase sertraline 100 mg bid.  Consult Visit: 07/07/2019 - GI - endoscopy performed. Mild gastritis with hiatal herania.  06/27/2019 - Ortho - Spinal compression fracture.  06/21/2019 - GI - repeat endoscopy due to Barrett's esophagus.  04/18/2019 - Chronic pain visit  Medications: Outpatient Encounter Medications as of 10/12/2019  Medication Sig  . albuterol (PROVENTIL) (2.5 MG/3ML) 0.083% nebulizer solution Take 2.5 mg by nebulization every 6 (six) hours as needed for wheezing or shortness of breath.  [name] atorvastatin (LIPITOR) 10 MG tablet Take 1 tablet (10 mg total) by mouth daily.  . calcium carbonate (OSCAL) 1500 (600 Ca) MG TABS tablet Take 600 mg of elemental calcium by mouth daily.  . clonazePAM (KLONOPIN) 1 MG tablet Take 1 tablet (1 mg total) by mouth 2 (two) times daily as needed for anxiety.  . cyclobenzaprine (FLEXERIL) 10 MG tablet Take 1 tablet (10 mg total) by mouth at bedtime.  . famotidine (PEPCID) 40 MG tablet Take 1 tablet (40 mg total) by mouth at bedtime.  . fexofenadine (ALLEGRA) 180 MG tablet Take 180 mg by mouth in the morning.  . Fluticasone-Umeclidin-Vilant (TRELEGY ELLIPTA) 100-62.5-25 MCG/INH AEPB Inhale 1 puff into  the lungs daily as needed.  . furosemide (LASIX) 20 MG tablet TAKE 1 TABLET (20 MG TOTAL) BY MOUTH DAILY AS NEEDED. AS NEEDED FOR SWELLING  . montelukast (SINGULAIR) 10 MG tablet Take 1 tablet (10 mg total) by mouth at bedtime.  [name] oxyCODONE-acetaminophen (PERCOCET) 10-325 MG tablet Take 1 tablet by mouth every 6 (six) hours.  . OXYGEN Inhale into the lungs. 2 L at night.  . pantoprazole (PROTONIX) 40 MG tablet TAKE 1 TABLET BY MOUTH TWICE A DAY  . promethazine (PHENERGAN) 25 MG tablet Take 25 mg by mouth every 6 (six) hours as needed for nausea or vomiting.  . sertraline (ZOLOFT) 100 MG tablet Take 2 tablets (200 mg total) by mouth daily.  [name] spironolactone (ALDACTONE) 25 MG tablet TAKE 1 TABLET BY MOUTH EVERY DAY  . VITAMIN D-VITAMIN K PO Take 1 tablet by mouth daily.  . pramipexole (MIRAPEX) 1 MG tablet TAKE 1 TABLET BY MOUTH AT BEDTIME. (Patient not taking: Reported on 10/12/2019)   No facility-administered encounter medications on file as of 10/12/2019.   Allergies  Allergen Reactions  . Sulfamethoxazole Anaphylaxis  . Sulfa Antibiotics Hives   SDOH Screenings   Alcohol Screen:   . Last Alcohol Screening Score (AUDIT):   Depression (PHQ2-9): Low Risk   . PHQ-2 Score: 1  Financial Resource Strain:   . Difficulty of Paying Living Expenses:   Food Insecurity: No Food Insecurity  . Worried About Charity fundraiser in the Last Year: Never true  . Ran Out of Food in the Last Year: Never true  Housing: Low Risk   . Last Housing Risk Score:  0  Physical Activity: Insufficiently Active  . Days of Exercise per Week: 3 days  . Minutes of Exercise per Session: 30 min  Social Connections:   . Frequency of Communication with Friends and Family:   . Frequency of Social Gatherings with Friends and Family:   . Attends Religious Services:   . Active Member of Clubs or Organizations:   . Attends Archivist Meetings:   [name] Marital Status:   Stress:   . Feeling of Stress :   Tobacco  Use: High Risk  . Smoking Tobacco Use: Current Every Day Smoker  . Smokeless Tobacco Use: Never Used  Transportation Needs: No Transportation Needs  . Lack of Transportation (Medical): No  . Lack of Transportation (Non-Medical): No    Current Diagnosis/Assessment:  Goals Addressed            This Visit's Progress   . Pharmacy Care Plan       CARE PLAN ENTRY (see longitudinal plan of care for additional care plan information)  Current Barriers:  . Chronic Disease Management support, education, and care coordination needs related to Hyperlipidemia, Osteoporosis, and Leg Swelling   Leg Swelling BP Readings from Last 3 Encounters:  08/30/19 118/64  08/23/19 132/68  04/27/19 120/64   . Pharmacist Clinical Goal(s): o Over the next 90 days, patient will work with PharmD and providers to maintain BP goal <130/80 . Current regimen:  o Furosemide 20 mg daily prn swelling o Spironolactone 25 mg daily . Interventions: o Continue healthy diet with low sodium and full of vegetables.  . Patient self care activities - Over the next 90 days, patient will: o Check BP as needed, document, and provide at future appointments o Ensure daily salt intake < 2300 mg/day  Hyperlipidemia Lab Results  Component Value Date/Time   LDLCALC 64 08/23/2019 08:10 AM   . Pharmacist Clinical Goal(s): o Over the next 90 days, patient will work with PharmD and providers to maintain LDL goal < 70 . Current regimen:  o Atorvastatin 10 mg daily . Interventions: o Keep up the good work with healthy diet and exercise 5 days per week.  . Patient self care activities - Over the next 90 days, patient will: o Continue to take medications as prescribed.  o Contact provider of pharmacist with any questions or concerns.  o Keep regularly scheduled follow-up with fasting labs in September.   Osteoporosis . Pharmacist Clinical Goal(s) o Over the next 90 days, patient will work with PharmD and providers to obtain  Prolia.  . Current regimen:  o Calcium 600 mg daily o Vitamin D with K 5000 units daily . Interventions: o Recommend patient continue supplements for healthy bones.  o Continue consistent intake of Calcium each day.  o Work to obtain Prolia through medical benefit.  . Patient self care activities - Over the next 90 days, patient will: o Continue to take Calcium and Vitamin D with K daily.  o Continue to incorporate weight bearing exercise 3-5 times each week.   Medication management . Pharmacist Clinical Goal(s): o Over the next 90 days, patient will work with PharmD and providers to achieve optimal medication adherence . [facility]  . Interventions o Comprehensive medication review performed. o Continue current medication management strategy . Patient self care activities - Over the next 90 days, patient will: o Focus on medication adherence by continuing to store together in bag.  o Take medications as prescribed o Report any questions or concerns to  PharmD and/or provider(s)  Initial goal documentation        Hyperlipidemia   LDL goal < 70  Lipid Panel     Component Value Date/Time   CHOL 156 08/23/2019 0810   TRIG 101 08/23/2019 0810   HDL 74 08/23/2019 0810   LDLCALC 64 08/23/2019 0810    Hepatic Function Latest Ref Rng & Units 08/23/2019  Total Protein 6.0 - 8.5 g/dL 6.4  Albumin 3.8 - 4.8 g/dL 4.2  AST 0 - 40 IU/L 23  ALT 0 - 32 IU/L 18  Alk Phosphatase 48 - 121 IU/L 108  Total Bilirubin 0.0 - 1.2 mg/dL 0.3     The 10-year ASCVD risk score Mikey Bussing DC Jr., et al., 2013) is: 11.5%   Values used to calculate the score:     Age: 40 years     Sex: Female     Is Non-Hispanic African American: No     Diabetic: No     Tobacco smoker: Yes     Systolic Blood Pressure: 229 mmHg     Is BP treated: No     HDL Cholesterol: 74 mg/dL     Total Cholesterol: 156 mg/dL   Patient has failed these meds in past: none reported Patient is currently controlled on  the following medications:  . Atorvastatin 10 mg daily  We discussed:  diet and exercise extensively. Patient enjoys cooking and gardening. She cans her fresh vegetables. She balances all of her meals. She cooks to control what is in her food. She does not like salt. She occasionally bakes pies/cakes. She is staying active and watching her diet to maintain her current weight of 122.   Plan  Continue current medications   COPD / Asthma / Tobacco   Eosinophil count:  No results found for: EOSPCT%                               Eos (Absolute):  Lab Results  Component Value Date/Time   EOSABS 0.2 08/23/2019 08:10 AM    Tobacco Status:  Social History   Tobacco Use  Smoking Status Current Every Day Smoker  . Packs/day: 0.50  . Years: 55.00  . Pack years: 27.50  . Types: E-cigarettes  Smokeless Tobacco Never Used  Tobacco Comment   Patient used to smoke up to 2 PPD.    Patient has failed these meds in past: Breo ellipta, benadryl, sudafed, allegra, claritin, zyrtec Patient is currently controlled on the following medications:   Albuterol 0.083% nebulizer solution q6 hours prn shortness of breath  Montelukast 10 mg daily at bedtime  Fexofenadine 180 mg daily   Trelegy inhaler uses as she begins with congestion Using maintenance inhaler regularly? No Frequency of rescue inhaler use:  infrequently  We discussed: Has a lot of bronchitis due to sinus drainage. Dr. Alcide Clever manages her lung conditions. In 2020 she had to use IV antibiotics for pneumonia and bronchitis which may have attributed to her weight loss. She has a sample of Trelegy for now from Dr. Alcide Clever.   Plan  Continue current medications ,  Leg Swelling   BP today is:  <130/80  Office blood pressures are  BP Readings from Last 3 Encounters:  08/30/19 118/64  08/23/19 132/68  04/27/19 120/64    Patient has failed these meds in the past: none reported Patient is currently controlled on the following  medications:   Furosemide 20 mg daily prn swelling  Spironolactone 25 mg daily   Patient checks BP at home infrequently  Patient home BP readings are ranging: 110-115/50-70  We discussed diet and exercise extensively. Patient has leg and ankle swelling but not actual high blood pressure. Leg swelling is controlled currently. Patient has lost 60 pounds in the last year. She drinks boost more regularly now to maintain weight around 122. She reports that she likes her size now.   Patient cooks balanced meals. She likes yogurt and smoothies with flax seed and collagen. Protein and green vegetables for supper. Patient exercises by walking her dogs, has back stretches that she does as well, works in garden, swims 3-5 for week.   Plan  Continue current medications     and  Other Diagnosis:Back Pain   Patient has failed these meds in past: celecoxib Patient is currently controlled on the following medications:   Percocet 10-325 mg q6 hours  Cyclobenzaprine 10 mg at bedtime  We discussed: Has had a shot in her back that was ineffective. Is using a CBD product from pain management at bedtime to help her rest better at night. She reports it is working. She is currently out of cyclobenzaprine and needs a refill currently.    Plan  Continue current medications   Barrett's Esophagus   Patient has failed these meds in past: none reported Patient is currently controlled on the following medications:  . Famotidine 40 mg daily at bedtime . Pantoprazole 40 mg twice daily  We discussed:  Reports that her symptoms are well controlled. Endoscopy performed this year. Supposed to call GI when she has any pain.   Plan  Continue current medications   Depression   Patient has failed these meds in past: citalopram Patient is currently controlled on the following medications:  . Sertraline 200 mg daily . Clonazepam 1 mg twice daily prn anxiety  We discussed:  Sertraline works better than  what she was on previously. She doesn't feel that it is working the way it should. She feels that her depression is not as controlled as she would like. She uses clonazepam as needed and does use often.   Plan  Continue current medications   Restless Leg   Patient has failed these meds in past: n/a Patient is currently controlled on the following medications:  . Pramipexole 1 mg at bedtime  We discussed:  Patient is having symptoms right now. She is out of medication currently. Pharmacist requesting refill from MD.   Plan  Continue current medications   Osteopenia / Osteoporosis   Last DEXA Scan: 03/15/2019  T-Score lumbar spine: -2  T-Score forearm radius: -2.1   No results found for: VD25OH   Patient is a candidate for pharmacologic treatment due to history of vertebral fracture  Patient has failed these meds in past: n/a Patient is currently uncontrolled on the following medications:  . Prolia every 6 months  . Calcium 600 mg daily . Vitamin D with K daily   We discussed:  Recommend [phone] units of vitamin D daily. Recommend 1200 mg of calcium daily from dietary and supplemental sources. Recommend weight-bearing and muscle strengthening exercises for building and maintaining bone density.[name] Has had 4 broken bones in last year (rib and compression fracture). Prolia is $700 through pharmacy. Pharmacist verifying medical benefit to ensure that it would be covered. Patient is having lots of pain and difficulty after compression fracture. Patient has shrunk 3 inches.   She feels frustrated by her limitations of the back pain from  osteoporosis fractures. She typically enjoys being active gardening, painting, Insurance risk surveyor. She feels frustrated by wanting to do things but unable to complete it.   Patient eats dairy daily. She enjoys milk, sour cream, ice cream.   Plan  Continue current medications  Health Maintenance   Patient is currently  controlled on the following medications:  . Promethazine 25 mg q6h prn nausea  We discussed:  Patient uses this for nausea rarely. She reports that it helps and likes to have it on hand for approximately monthly use.   Plan  Continue current medications  Vaccines   Reviewed and discussed patient's vaccination history.    Immunization History  Administered Date(s) Administered  . Influenza-Unspecified 12/05/2018  . Moderna SARS-COVID-2 Vaccination 05/17/2019, 06/14/2019  . Pneumococcal Polysaccharide-23 12/22/2015  . Tdap 06/30/2017    Plan  Recommended patient receive annual flu vaccine in office.   Medication Management   [facility] Uses pill box? Yes Pt endorses good compliance  We discussed: Patient reports taking medication appropriately. She uses [facility] for [facility] plan ([facility]   Plan  Continue current medication management strategy    Follow up: 2 month phone visit

## 2019-10-12 ENCOUNTER — Other Ambulatory Visit: Payer: Self-pay

## 2019-10-12 ENCOUNTER — Ambulatory Visit: Payer: Medicare Other

## 2019-10-12 DIAGNOSIS — E782 Mixed hyperlipidemia: Secondary | ICD-10-CM

## 2019-10-12 DIAGNOSIS — M8000XD Age-related osteoporosis with current pathological fracture, unspecified site, subsequent encounter for fracture with routine healing: Secondary | ICD-10-CM

## 2019-10-12 MED ORDER — CYCLOBENZAPRINE HCL 10 MG PO TABS: 10.0000 mg | ORAL_TABLET | Freq: Every day | ORAL | 2 refills | Status: DC

## 2019-10-12 MED ORDER — PRAMIPEXOLE DIHYDROCHLORIDE 1 MG PO TABS: 1.0000 mg | ORAL_TABLET | Freq: Every day | ORAL | 0 refills | Status: DC

## 2019-10-12 NOTE — Patient Instructions (Addendum)
Visit Information  Thank you for your time discussing your medications. I look forward to working with you to achieve your health care goals. Below is a summary of what we talked about during our visit.   Goals Addressed            This Visit's Progress   . Pharmacy Care Plan       CARE PLAN ENTRY (see longitudinal plan of care for additional care plan information)  Current Barriers:  . Chronic Disease Management support, education, and care coordination needs related to Hyperlipidemia, Osteoporosis, and Leg Swelling   Leg Swelling BP Readings from Last 3 Encounters:  08/30/19 118/64  08/23/19 132/68  04/27/19 120/64   . Pharmacist Clinical Goal(s): o Over the next 90 days, patient will work with PharmD and providers to maintain BP goal <130/80 . Current regimen:  o Furosemide 20 mg daily prn swelling o Spironolactone 25 mg daily . Interventions: o Continue healthy diet with low sodium and full of vegetables.  . Patient self care activities - Over the next 90 days, patient will: o Check BP as needed, document, and provide at future appointments o Ensure daily salt intake < 2300 mg/day  Hyperlipidemia Lab Results  Component Value Date/Time   LDLCALC 64 08/23/2019 08:10 AM   . Pharmacist Clinical Goal(s): o Over the next 90 days, patient will work with PharmD and providers to maintain LDL goal < 70 . Current regimen:  o Atorvastatin 10 mg daily . Interventions: o Keep up the good work with healthy diet and exercise 5 days per week.  . Patient self care activities - Over the next 90 days, patient will: o Continue to take medications as prescribed.  o Contact provider of pharmacist with any questions or concerns.  o Keep regularly scheduled follow-up with fasting labs in September.   Osteoporosis . Pharmacist Clinical Goal(s) o Over the next 90 days, patient will work with PharmD and providers to obtain Prolia.  . Current regimen:  o Calcium 600 mg daily o Vitamin D  with K 5000 units daily . Interventions: o Recommend patient continue supplements for healthy bones.  o Continue consistent intake of Calcium each day.  o Work to obtain Prolia through medical benefit.  . Patient self care activities - Over the next 90 days, patient will: o Continue to take Calcium and Vitamin D with K daily.  o Continue to incorporate weight bearing exercise 3-5 times each week.   Medication management . Pharmacist Clinical Goal(s): o Over the next 90 days, patient will work with PharmD and providers to achieve optimal medication adherence . [facility]  . Interventions o Comprehensive medication review performed. o Continue current medication management strategy . Patient self care activities - Over the next 90 days, patient will: o Focus on medication adherence by continuing to store together in bag.  o Take medications as prescribed o Report any questions or concerns to PharmD and/or provider(s)  Initial goal documentation        Ellen Holloway was given information about Chronic Care Management services today including:  1. CCM service includes personalized support from designated clinical staff supervised by her physician, including individualized plan of care and coordination with other care providers 2. 24/7 contact phone numbers for assistance for urgent and routine care needs. 3. Standard insurance, coinsurance, copays and deductibles apply for chronic care management only during months in which we provide at least 20 minutes of these services. Most insurances cover these services at  100%, however patients may be responsible for any copay, coinsurance and/or deductible if applicable. This service may help you avoid the need for more expensive face-to-face services. 4. Only one practitioner may furnish and bill the service in a calendar month. 5. The patient may stop CCM services at any time (effective at the end of the month) by phone call to the office  staff.  Patient agreed to services and verbal consent obtained.   The patient verbalized understanding of instructions provided today and agreed to receive a mailed copy of patient instruction and/or educational materials. Telephone follow up appointment with pharmacy team member scheduled for: 11/2019  Sherre Poot, PharmD Clinical Pharmacist [facility] [phone] (office) [phone] (mobile)    Osteoporosis  Osteoporosis happens when your bones get thin and weak. This can cause your bones to break (fracture) more easily. You can do things at home to make your bones stronger. Follow these instructions at home:  Activity  Exercise as told by your doctor. Ask your doctor what activities are safe for you. You should do: ? Exercises that make your muscles work to hold your body weight up (weight-bearing exercises). These include tai chi, yoga, and walking. ? Exercises to make your muscles stronger. One example is lifting weights. Lifestyle  Limit alcohol intake to no more than 1 drink a day for nonpregnant women and 2 drinks a day for men. One drink equals 12 oz of beer, 5 oz of wine, or 1 oz of hard liquor.  Do not use any products that have nicotine or tobacco in them. These include cigarettes and e-cigarettes. If you need help quitting, ask your doctor. Preventing falls  Use tools to help you move around (mobility aids) as needed. These include canes, walkers, scooters, and crutches.  Keep rooms well-lit and free of clutter.  Put away things that could make you trip. These include cords and rugs.  Install safety rails on stairs. Install grab bars in bathrooms.  Use rubber mats in slippery areas, like bathrooms.  Wear shoes that: ? Fit you well. ? Support your feet. ? Have closed toes. ? Have rubber soles or low heels.  Tell your doctor about all of the medicines you are taking. Some medicines can make you more likely to fall. General  instructions  Eat plenty of calcium and vitamin D. These nutrients are good for your bones. Good sources of calcium and vitamin D include: ? Some fatty fish, such as salmon and tuna. ? Foods that have calcium and vitamin D added to them (fortified foods). For example, some breakfast cereals are fortified with calcium and vitamin D. ? Egg yolks. ? Cheese. ? Liver.  Take over-the-counter and prescription medicines only as told by your doctor.  Keep all follow-up visits as told by your doctor. This is important. Contact a doctor if:  You have not been tested (screened) for osteoporosis and you are: ? A woman who is age 82 or older. ? A man who is age 63 or older. Get help right away if:  You fall.  You get hurt. Summary  Osteoporosis happens when your bones get thin and weak.  Weak bones can break (fracture) more easily.  Eat plenty of calcium and vitamin D. These nutrients are good for your bones.  Tell your doctor about all of the medicines that you take. This information is not intended to replace advice given to you by your health care provider. Make sure you discuss any questions you have with  your health care provider. Document Revised: 02/12/2017 Document Reviewed: 12/25/2016 Elsevier Patient Education  2020 [employer].

## 2019-10-13 ENCOUNTER — Ambulatory Visit (INDEPENDENT_AMBULATORY_CARE_PROVIDER_SITE_OTHER): Payer: Medicare Other

## 2019-10-13 ENCOUNTER — Other Ambulatory Visit: Payer: Self-pay

## 2019-10-13 DIAGNOSIS — M8000XA Age-related osteoporosis with current pathological fracture, unspecified site, initial encounter for fracture: Secondary | ICD-10-CM | POA: Diagnosis not present

## 2019-10-13 MED ORDER — DENOSUMAB 60 MG/ML ~~LOC~~ SOSY
60.0000 mg | PREFILLED_SYRINGE | Freq: Once | SUBCUTANEOUS | Status: AC
  Administered 2019-10-13: 60 mg via SUBCUTANEOUS

## 2019-10-13 NOTE — Progress Notes (Signed)
Patient came in for a Prolia injection.

## 2019-10-17 ENCOUNTER — Other Ambulatory Visit: Payer: Self-pay | Admitting: Physician Assistant

## 2019-10-19 ENCOUNTER — Other Ambulatory Visit: Payer: Self-pay | Admitting: Family Medicine

## 2019-10-19 DIAGNOSIS — M5416 Radiculopathy, lumbar region: Secondary | ICD-10-CM | POA: Diagnosis not present

## 2019-10-19 DIAGNOSIS — R202 Paresthesia of skin: Secondary | ICD-10-CM | POA: Diagnosis not present

## 2019-10-19 DIAGNOSIS — Z1389 Encounter for screening for other disorder: Secondary | ICD-10-CM | POA: Diagnosis not present

## 2019-10-19 DIAGNOSIS — S3210XA Unspecified fracture of sacrum, initial encounter for closed fracture: Secondary | ICD-10-CM | POA: Diagnosis not present

## 2019-10-19 DIAGNOSIS — S22050D Wedge compression fracture of T5-T6 vertebra, subsequent encounter for fracture with routine healing: Secondary | ICD-10-CM | POA: Diagnosis not present

## 2019-10-19 DIAGNOSIS — G894 Chronic pain syndrome: Secondary | ICD-10-CM | POA: Diagnosis not present

## 2019-10-19 DIAGNOSIS — M47816 Spondylosis without myelopathy or radiculopathy, lumbar region: Secondary | ICD-10-CM | POA: Diagnosis not present

## 2019-11-08 DIAGNOSIS — M47814 Spondylosis without myelopathy or radiculopathy, thoracic region: Secondary | ICD-10-CM | POA: Diagnosis not present

## 2019-11-08 DIAGNOSIS — G894 Chronic pain syndrome: Secondary | ICD-10-CM | POA: Diagnosis not present

## 2019-11-14 DIAGNOSIS — J301 Allergic rhinitis due to pollen: Secondary | ICD-10-CM | POA: Diagnosis not present

## 2019-11-14 DIAGNOSIS — G4733 Obstructive sleep apnea (adult) (pediatric): Secondary | ICD-10-CM | POA: Diagnosis not present

## 2019-11-14 DIAGNOSIS — J4530 Mild persistent asthma, uncomplicated: Secondary | ICD-10-CM | POA: Diagnosis not present

## 2019-11-15 ENCOUNTER — Other Ambulatory Visit: Payer: Self-pay | Admitting: Family Medicine

## 2019-11-15 DIAGNOSIS — J301 Allergic rhinitis due to pollen: Secondary | ICD-10-CM

## 2019-11-16 ENCOUNTER — Other Ambulatory Visit: Payer: Self-pay

## 2019-11-16 ENCOUNTER — Other Ambulatory Visit: Payer: Self-pay | Admitting: Physician Assistant

## 2019-11-16 DIAGNOSIS — F321 Major depressive disorder, single episode, moderate: Secondary | ICD-10-CM

## 2019-11-16 MED ORDER — CLONAZEPAM 1 MG PO TABS: 1.0000 mg | ORAL_TABLET | Freq: Two times a day (BID) | ORAL | 1 refills | Status: DC | PRN

## 2019-11-16 MED ORDER — CYCLOBENZAPRINE HCL 10 MG PO TABS: 10.0000 mg | ORAL_TABLET | Freq: Every day | ORAL | 2 refills | Status: DC

## 2019-11-17 ENCOUNTER — Telehealth: Payer: Self-pay

## 2019-11-17 DIAGNOSIS — S22050D Wedge compression fracture of T5-T6 vertebra, subsequent encounter for fracture with routine healing: Secondary | ICD-10-CM | POA: Diagnosis not present

## 2019-11-17 DIAGNOSIS — M47816 Spondylosis without myelopathy or radiculopathy, lumbar region: Secondary | ICD-10-CM | POA: Diagnosis not present

## 2019-11-17 DIAGNOSIS — S3210XA Unspecified fracture of sacrum, initial encounter for closed fracture: Secondary | ICD-10-CM | POA: Diagnosis not present

## 2019-11-17 DIAGNOSIS — Z1389 Encounter for screening for other disorder: Secondary | ICD-10-CM | POA: Diagnosis not present

## 2019-11-17 DIAGNOSIS — R202 Paresthesia of skin: Secondary | ICD-10-CM | POA: Diagnosis not present

## 2019-11-17 DIAGNOSIS — M5416 Radiculopathy, lumbar region: Secondary | ICD-10-CM | POA: Diagnosis not present

## 2019-11-17 DIAGNOSIS — G894 Chronic pain syndrome: Secondary | ICD-10-CM | POA: Diagnosis not present

## 2019-11-17 NOTE — Telephone Encounter (Signed)
PA for cyclobenzaprine submitted and approved via [email or web address]

## 2019-11-23 NOTE — Progress Notes (Signed)
Subjective:  Patient ID: Ellen Holloway, female    DOB: [birth year]  Age: 69 y.o. MRN: [id number]  Chief Complaint  Patient presents with  . COPD  . Diabetes  . Hyperlipidemia    HPI  Patient is a 69 year old white female with history of COPD and chronic smoking.  She presented early in January with significant weight loss over the prior 4 to 6 months.  Her weight has stablilized.  She has not quit smoking, but is down to a 1/2 ppd. She is using trelegy one inhalation daily.   Her depression is well controlled on zoloft 100 mg 2 daily. Taking clonazepam once daily prn.  Patient suffers from restless leg syndrome she takes Mirapex 1 mg 1 daily at nighttime.   She also has sleep apnea and wears her CPAP combined with her oxygen at 2 L at night. She does not need oxygen at any other times.    She is currently still taking spironolactone for swelling and then she uses the Lasix as needed but she rarely needs this. She has not been diagnosed with CHF. Her work up was negative.   GERD- well controlled on pantoprazole and famotidine.   Mixed Hyperlipidemia- Taking lipitor. Eating healthy. Patient is exercising.  Osteoporosis - Taking calcium and vitamin D.   Allergic rhinitis - Currently taking allegra and singulair..   Current Outpatient Medications on File Prior to Visit  Medication Sig Dispense Refill  . albuterol (PROVENTIL) (2.5 MG/3ML) 0.083% nebulizer solution Take 2.5 mg by nebulization every 6 (six) hours as needed for wheezing or shortness of breath.    [name] atorvastatin (LIPITOR) 10 MG tablet TAKE 1 TABLET BY MOUTH EVERY DAY 90 tablet 0  . calcium carbonate (OSCAL) 1500 (600 Ca) MG TABS tablet Take 600 mg of elemental calcium by mouth daily.    . clonazePAM (KLONOPIN) 1 MG tablet Take 1 tablet (1 mg total) by mouth 2 (two) times daily as needed for anxiety. 30 tablet 1  . cyclobenzaprine (FLEXERIL) 10 MG tablet Take 1 tablet (10 mg total) by mouth at bedtime. 30 tablet 2  .  famotidine (PEPCID) 40 MG tablet TAKE 1 TABLET BY MOUTH EVERYDAY AT BEDTIME 90 tablet 0  . fexofenadine (ALLEGRA) 180 MG tablet Take 180 mg by mouth in the morning.    . Fluticasone-Umeclidin-Vilant (TRELEGY ELLIPTA) 100-62.5-25 MCG/INH AEPB Inhale 1 puff into the lungs daily as needed.    . furosemide (LASIX) 20 MG tablet TAKE 1 TABLET (20 MG TOTAL) BY MOUTH DAILY AS NEEDED. AS NEEDED FOR SWELLING 90 tablet 0  . montelukast (SINGULAIR) 10 MG tablet TAKE 1 TABLET BY MOUTH EVERYDAY AT BEDTIME 90 tablet 1  . oxyCODONE-acetaminophen (PERCOCET) 10-325 MG tablet Take 1 tablet by mouth every 6 (six) hours.    . OXYGEN Inhale into the lungs. 2 L at night.    . pantoprazole (PROTONIX) 40 MG tablet TAKE 1 TABLET BY MOUTH TWICE A DAY 180 tablet 0  . pramipexole (MIRAPEX) 1 MG tablet Take 1 tablet (1 mg total) by mouth at bedtime. 90 tablet 0  . PROLIA 60 MG/ML SOSY injection Inject into the skin.    . promethazine (PHENERGAN) 25 MG tablet Take 25 mg by mouth every 6 (six) hours as needed for nausea or vomiting.    . sertraline (ZOLOFT) 100 MG tablet Take 2 tablets (200 mg total) by mouth daily. 180 tablet 0  . spironolactone (ALDACTONE) 25 MG tablet TAKE 1 TABLET BY MOUTH EVERY DAY 90 tablet  0  . VITAMIN D-VITAMIN K PO Take 1 tablet by mouth daily.     No current facility-administered medications on file prior to visit.   Social Hx   Social History   Socioeconomic History  . Marital status: Married    Spouse name: Not on file  . Number of children: Not on file  . Years of education: Not on file  . Highest education level: Not on file  Occupational History  . Not on file  Tobacco Use  . Smoking status: Current Every Day Smoker    Packs/day: 0.50    Years: 55.00    Pack years: 27.50    Types: E-cigarettes  . Smokeless tobacco: Never Used  . Tobacco comment: Patient used to smoke up to 2 PPD.  Substance and Sexual Activity  . Alcohol use: Never  . Drug use: Never  . Sexual activity: Not on  file  Other Topics Concern  . Not on file  Social History Narrative  . Not on file   Social Determinants of Health   Financial Resource Strain:   . Difficulty of Paying Living Expenses: Not on file  Food Insecurity: No Food Insecurity  . Worried About Programme researcher, broadcasting/film/video in the Last Year: Never true  . Ran Out of Food in the Last Year: Never true  Transportation Needs: No Transportation Needs  . Lack of Transportation (Medical): No  . Lack of Transportation (Non-Medical): No  Physical Activity: Insufficiently Active  . Days of Exercise per Week: 3 days  . Minutes of Exercise per Session: 30 min  Stress:   . Feeling of Stress : Not on file  Social Connections:   . Frequency of Communication with Friends and Family: Not on file  . Frequency of Social Gatherings with Friends and Family: Not on file  . Attends Religious Services: Not on file  . Active Member of Clubs or Organizations: Not on file  . Attends Banker Meetings: Not on file  . Marital Status: Not on file   Past Medical History:  Diagnosis Date  . Acquired absence of both cervix and uterus   . Age-related osteoporosis with current pathological fracture, unspecified site, initial encounter for fracture   . Barrett's esophagus with low grade dysplasia   . Chronic obstructive pulmonary disease (COPD) (HCC)   . Major depressive disorder, recurrent severe without psychotic features (HCC)   . Mixed hyperlipidemia   . Obstructive sleep apnea (adult) (pediatric)   . Other obesity due to excess calories   . Other pulmonary embolism without acute cor pulmonale (HCC)   . Other specified anxiety disorders   . Sleep related leg cramps   . Telogen effluvium    The patient has a family history of myocardial infarction, renal failure (Wegener's)  Review of Systems  Constitutional: Negative for chills, fatigue and fever.  HENT: Positive for rhinorrhea. Negative for congestion, ear pain and sore throat.    Respiratory: Positive for cough. Negative for shortness of breath.   Cardiovascular: Negative for chest pain.  Gastrointestinal: Negative for abdominal pain, constipation, diarrhea, nausea and vomiting.  Genitourinary: Negative for dysuria and urgency.  Musculoskeletal: Positive for back pain.  Neurological: Negative for dizziness, weakness and headaches.  Psychiatric/Behavioral: Negative for dysphoric mood. The patient is not nervous/anxious.        No anhedonia.     Objective:  BP 122/68   Pulse 88   Temp 97.7 F (36.5 C)   Ht 5\' 4"  (1.626 m)  Wt 125 lb (56.7 kg)   SpO2 94%   BMI 21.46 kg/m   BP/Weight 11/24/2019 08/30/2019 08/23/2019  Systolic BP 122 118 132  Diastolic BP 68 64 68  Wt. (Lbs) 125 122 121.8  BMI 21.46 20.94 19.66    Physical Exam Constitutional:      Appearance: Normal appearance.  Neck:     Vascular: No carotid bruit.  Cardiovascular:     Rate and Rhythm: Normal rate and regular rhythm.  Pulmonary:     Effort: Pulmonary effort is normal.     Breath sounds: Normal breath sounds.  Abdominal:     General: Bowel sounds are normal.     Palpations: Abdomen is soft.     Tenderness: There is no abdominal tenderness.  Musculoskeletal:     Comments: Tender over thoracic vertebrae.  Neurological:     Mental Status: She is alert and oriented to person, place, and time.  Psychiatric:        Mood and Affect: Mood normal.        Behavior: Behavior normal.    Assessment & Plan:  1. Mixed hyperlipidemia Well controlled.  No changes to medicines.  Continue to work on eating a healthy diet and exercise.  Labs drawn today.  - CBC with Differential/Platelet - Comprehensive metabolic panel  2. Depression, major, recurrent, mild (HCC) The current medical regimen is effective;  continue present plan and medications.  3. OSA on CPAP Continue cpap with O2 2 L.  4. Needs flu shot - Flu Vaccine QUAD High Dose(Fluad)  5. Visit for screening mammogram - MM  Digital Screening; Future  6. Need for immunization against influenza - Flu Vaccine QUAD High Dose(Fluad)  Follow-up: Return in about 4 months (around 03/25/2020) for fsating.  A CLINICAL SUMMARY including a written plan identify barriers to care unique to individual due to social or financial issues and help create solutions together. and a patient's and the patient's families understanding of their medical issues and care needs   Louanne Skye [facility] [phone]

## 2019-11-24 ENCOUNTER — Other Ambulatory Visit: Payer: Self-pay

## 2019-11-24 ENCOUNTER — Ambulatory Visit (INDEPENDENT_AMBULATORY_CARE_PROVIDER_SITE_OTHER): Payer: Medicare Other | Admitting: Family Medicine

## 2019-11-24 ENCOUNTER — Encounter: Payer: Self-pay | Admitting: Family Medicine

## 2019-11-24 VITALS — BP 122/68 | HR 88 | Temp 97.7°F | Ht 64.0 in | Wt 125.0 lb

## 2019-11-24 DIAGNOSIS — E782 Mixed hyperlipidemia: Secondary | ICD-10-CM | POA: Diagnosis not present

## 2019-11-24 DIAGNOSIS — Z23 Encounter for immunization: Secondary | ICD-10-CM | POA: Diagnosis not present

## 2019-11-24 DIAGNOSIS — Z1231 Encounter for screening mammogram for malignant neoplasm of breast: Secondary | ICD-10-CM | POA: Diagnosis not present

## 2019-11-24 DIAGNOSIS — Z9989 Dependence on other enabling machines and devices: Secondary | ICD-10-CM

## 2019-11-24 DIAGNOSIS — G4733 Obstructive sleep apnea (adult) (pediatric): Secondary | ICD-10-CM | POA: Diagnosis not present

## 2019-11-24 DIAGNOSIS — F330 Major depressive disorder, recurrent, mild: Secondary | ICD-10-CM

## 2019-11-25 LAB — COMPREHENSIVE METABOLIC PANEL
ALT: 15 IU/L (ref 0–32)
AST: 17 IU/L (ref 0–40)
Albumin/Globulin Ratio: 2.1 (ref 1.2–2.2)
Albumin: 4.2 g/dL (ref 3.8–4.8)
Alkaline Phosphatase: 89 IU/L (ref 48–121)
BUN/Creatinine Ratio: 15 (ref 12–28)
BUN: 12 mg/dL (ref 8–27)
Bilirubin Total: 0.4 mg/dL (ref 0.0–1.2)
CO2: 26 mmol/L (ref 20–29)
Calcium: 9.5 mg/dL (ref 8.7–10.3)
Chloride: 102 mmol/L (ref 96–106)
Creatinine, Ser: 0.81 mg/dL (ref 0.57–1.00)
GFR calc Af Amer: 85 mL/min/{1.73_m2} (ref >59)
GFR calc non Af Amer: 74 mL/min/{1.73_m2} (ref >59)
Globulin, Total: 2 g/dL (ref 1.5–4.5)
Glucose: 91 mg/dL (ref 65–99)
Potassium: 4.4 mmol/L (ref 3.5–5.2)
Sodium: 140 mmol/L (ref 134–144)
Total Protein: 6.2 g/dL (ref 6.0–8.5)

## 2019-11-25 LAB — CBC WITH DIFFERENTIAL/PLATELET
Basophils Absolute: 0.1 10*3/uL (ref 0.0–0.2)
Basos: 1 %
EOS (ABSOLUTE): 0.1 10*3/uL (ref 0.0–0.4)
Eos: 2 %
Hematocrit: 40.9 % (ref 34.0–46.6)
Hemoglobin: 13.7 g/dL (ref 11.1–15.9)
Immature Grans (Abs): 0 10*3/uL (ref 0.0–0.1)
Immature Granulocytes: 0 %
Lymphocytes Absolute: 2.5 10*3/uL (ref 0.7–3.1)
Lymphs: 36 %
MCH: 30.9 pg (ref 26.6–33.0)
MCHC: 33.5 g/dL (ref 31.5–35.7)
MCV: 92 fL (ref 79–97)
Monocytes Absolute: 0.7 10*3/uL (ref 0.1–0.9)
Monocytes: 9 %
Neutrophils Absolute: 3.6 10*3/uL (ref 1.4–7.0)
Neutrophils: 52 %
Platelets: 206 10*3/uL (ref 150–450)
RBC: 4.44 x10E6/uL (ref 3.77–5.28)
RDW: 12.9 % (ref 11.7–15.4)
WBC: 6.9 10*3/uL (ref 3.4–10.8)

## 2019-12-05 ENCOUNTER — Telehealth: Payer: Self-pay

## 2019-12-05 NOTE — Telephone Encounter (Signed)
Patient has been scheduled for mammogram at [facility] for 03/18/2020 @ 9:30AM. Called and left voicemail informing patient on her home phone and told her if she has any questions or concerns to give [place] a call back. [place]

## 2019-12-06 ENCOUNTER — Ambulatory Visit: Payer: Medicare Other

## 2019-12-18 DIAGNOSIS — M47816 Spondylosis without myelopathy or radiculopathy, lumbar region: Secondary | ICD-10-CM | POA: Diagnosis not present

## 2019-12-18 DIAGNOSIS — R202 Paresthesia of skin: Secondary | ICD-10-CM | POA: Diagnosis not present

## 2019-12-18 DIAGNOSIS — Z1389 Encounter for screening for other disorder: Secondary | ICD-10-CM | POA: Diagnosis not present

## 2019-12-18 DIAGNOSIS — G894 Chronic pain syndrome: Secondary | ICD-10-CM | POA: Diagnosis not present

## 2019-12-18 DIAGNOSIS — S3210XA Unspecified fracture of sacrum, initial encounter for closed fracture: Secondary | ICD-10-CM | POA: Diagnosis not present

## 2019-12-18 DIAGNOSIS — M5416 Radiculopathy, lumbar region: Secondary | ICD-10-CM | POA: Diagnosis not present

## 2019-12-18 DIAGNOSIS — S22050D Wedge compression fracture of T5-T6 vertebra, subsequent encounter for fracture with routine healing: Secondary | ICD-10-CM | POA: Diagnosis not present

## 2019-12-26 DIAGNOSIS — J4530 Mild persistent asthma, uncomplicated: Secondary | ICD-10-CM | POA: Diagnosis not present

## 2019-12-26 DIAGNOSIS — G4733 Obstructive sleep apnea (adult) (pediatric): Secondary | ICD-10-CM | POA: Diagnosis not present

## 2019-12-26 DIAGNOSIS — J301 Allergic rhinitis due to pollen: Secondary | ICD-10-CM | POA: Diagnosis not present

## 2020-01-09 DIAGNOSIS — G4733 Obstructive sleep apnea (adult) (pediatric): Secondary | ICD-10-CM | POA: Diagnosis not present

## 2020-01-09 DIAGNOSIS — J301 Allergic rhinitis due to pollen: Secondary | ICD-10-CM | POA: Diagnosis not present

## 2020-01-09 DIAGNOSIS — J4530 Mild persistent asthma, uncomplicated: Secondary | ICD-10-CM | POA: Diagnosis not present

## 2020-01-10 ENCOUNTER — Other Ambulatory Visit: Payer: Self-pay | Admitting: Physician Assistant

## 2020-01-10 ENCOUNTER — Other Ambulatory Visit: Payer: Self-pay | Admitting: Family Medicine

## 2020-01-11 ENCOUNTER — Other Ambulatory Visit: Payer: Self-pay

## 2020-01-11 ENCOUNTER — Telehealth: Payer: Self-pay

## 2020-01-11 MED ORDER — SPIRONOLACTONE 25 MG PO TABS
25.0000 mg | ORAL_TABLET | Freq: Every day | ORAL | 0 refills | Status: DC
Start: 2020-01-11 — End: 2020-06-20

## 2020-01-11 MED ORDER — PRAMIPEXOLE DIHYDROCHLORIDE 1 MG PO TABS
1.0000 mg | ORAL_TABLET | Freq: Every day | ORAL | 0 refills | Status: DC
Start: 2020-01-11 — End: 2020-07-08

## 2020-01-11 NOTE — Chronic Care Management (AMB) (Signed)
Patient reports things are going well with her. She reports out of two of her medications at this time (spironolactone and pramipexole). Pharmacist requested refills from Dr. Renea Ee nurse sent to [facility].   Patient is aware that her next dose of Prolia will be due at the beginning of February and it is her responsibility to contact office to coordinate verification of benefits and scheduling injection.   Juliane Lack, PharmD, [facility] Clinical Pharmacist [facility] [phone] (office) [phone] (mobile)

## 2020-01-15 DIAGNOSIS — Z1389 Encounter for screening for other disorder: Secondary | ICD-10-CM | POA: Diagnosis not present

## 2020-01-15 DIAGNOSIS — R202 Paresthesia of skin: Secondary | ICD-10-CM | POA: Diagnosis not present

## 2020-01-15 DIAGNOSIS — S3210XA Unspecified fracture of sacrum, initial encounter for closed fracture: Secondary | ICD-10-CM | POA: Diagnosis not present

## 2020-01-15 DIAGNOSIS — M47816 Spondylosis without myelopathy or radiculopathy, lumbar region: Secondary | ICD-10-CM | POA: Diagnosis not present

## 2020-01-15 DIAGNOSIS — G894 Chronic pain syndrome: Secondary | ICD-10-CM | POA: Diagnosis not present

## 2020-01-15 DIAGNOSIS — M5416 Radiculopathy, lumbar region: Secondary | ICD-10-CM | POA: Diagnosis not present

## 2020-01-15 DIAGNOSIS — S22050D Wedge compression fracture of T5-T6 vertebra, subsequent encounter for fracture with routine healing: Secondary | ICD-10-CM | POA: Diagnosis not present

## 2020-02-07 DIAGNOSIS — J4530 Mild persistent asthma, uncomplicated: Secondary | ICD-10-CM | POA: Diagnosis not present

## 2020-02-07 DIAGNOSIS — J301 Allergic rhinitis due to pollen: Secondary | ICD-10-CM | POA: Diagnosis not present

## 2020-02-07 DIAGNOSIS — G4733 Obstructive sleep apnea (adult) (pediatric): Secondary | ICD-10-CM | POA: Diagnosis not present

## 2020-02-12 ENCOUNTER — Other Ambulatory Visit: Payer: Self-pay | Admitting: Physician Assistant

## 2020-02-13 DIAGNOSIS — Z23 Encounter for immunization: Secondary | ICD-10-CM | POA: Diagnosis not present

## 2020-02-16 DIAGNOSIS — J301 Allergic rhinitis due to pollen: Secondary | ICD-10-CM | POA: Diagnosis not present

## 2020-02-19 DIAGNOSIS — R202 Paresthesia of skin: Secondary | ICD-10-CM | POA: Diagnosis not present

## 2020-02-19 DIAGNOSIS — G894 Chronic pain syndrome: Secondary | ICD-10-CM | POA: Diagnosis not present

## 2020-02-19 DIAGNOSIS — S22050D Wedge compression fracture of T5-T6 vertebra, subsequent encounter for fracture with routine healing: Secondary | ICD-10-CM | POA: Diagnosis not present

## 2020-02-19 DIAGNOSIS — Z1389 Encounter for screening for other disorder: Secondary | ICD-10-CM | POA: Diagnosis not present

## 2020-02-19 DIAGNOSIS — M5416 Radiculopathy, lumbar region: Secondary | ICD-10-CM | POA: Diagnosis not present

## 2020-02-19 DIAGNOSIS — M47816 Spondylosis without myelopathy or radiculopathy, lumbar region: Secondary | ICD-10-CM | POA: Diagnosis not present

## 2020-02-19 DIAGNOSIS — S3210XA Unspecified fracture of sacrum, initial encounter for closed fracture: Secondary | ICD-10-CM | POA: Diagnosis not present

## 2020-02-21 DIAGNOSIS — J301 Allergic rhinitis due to pollen: Secondary | ICD-10-CM | POA: Diagnosis not present

## 2020-02-21 DIAGNOSIS — J4530 Mild persistent asthma, uncomplicated: Secondary | ICD-10-CM | POA: Diagnosis not present

## 2020-02-21 DIAGNOSIS — Z7189 Other specified counseling: Secondary | ICD-10-CM | POA: Diagnosis not present

## 2020-02-21 DIAGNOSIS — G4733 Obstructive sleep apnea (adult) (pediatric): Secondary | ICD-10-CM | POA: Diagnosis not present

## 2020-02-21 DIAGNOSIS — M47816 Spondylosis without myelopathy or radiculopathy, lumbar region: Secondary | ICD-10-CM | POA: Diagnosis not present

## 2020-03-17 ENCOUNTER — Other Ambulatory Visit: Payer: Self-pay | Admitting: Physician Assistant

## 2020-03-17 DIAGNOSIS — F321 Major depressive disorder, single episode, moderate: Secondary | ICD-10-CM

## 2020-03-20 DIAGNOSIS — G894 Chronic pain syndrome: Secondary | ICD-10-CM | POA: Diagnosis not present

## 2020-03-20 DIAGNOSIS — R202 Paresthesia of skin: Secondary | ICD-10-CM | POA: Diagnosis not present

## 2020-03-20 DIAGNOSIS — M47816 Spondylosis without myelopathy or radiculopathy, lumbar region: Secondary | ICD-10-CM | POA: Diagnosis not present

## 2020-03-20 DIAGNOSIS — M5416 Radiculopathy, lumbar region: Secondary | ICD-10-CM | POA: Diagnosis not present

## 2020-03-20 DIAGNOSIS — Z1389 Encounter for screening for other disorder: Secondary | ICD-10-CM | POA: Diagnosis not present

## 2020-03-20 DIAGNOSIS — S3210XA Unspecified fracture of sacrum, initial encounter for closed fracture: Secondary | ICD-10-CM | POA: Diagnosis not present

## 2020-03-20 DIAGNOSIS — S22050D Wedge compression fracture of T5-T6 vertebra, subsequent encounter for fracture with routine healing: Secondary | ICD-10-CM | POA: Diagnosis not present

## 2020-03-27 ENCOUNTER — Ambulatory Visit: Payer: Medicare Other | Admitting: Family Medicine

## 2020-04-22 DIAGNOSIS — M47816 Spondylosis without myelopathy or radiculopathy, lumbar region: Secondary | ICD-10-CM | POA: Diagnosis not present

## 2020-04-22 DIAGNOSIS — R202 Paresthesia of skin: Secondary | ICD-10-CM | POA: Diagnosis not present

## 2020-04-22 DIAGNOSIS — S3210XA Unspecified fracture of sacrum, initial encounter for closed fracture: Secondary | ICD-10-CM | POA: Diagnosis not present

## 2020-04-22 DIAGNOSIS — Z1389 Encounter for screening for other disorder: Secondary | ICD-10-CM | POA: Diagnosis not present

## 2020-04-22 DIAGNOSIS — M5416 Radiculopathy, lumbar region: Secondary | ICD-10-CM | POA: Diagnosis not present

## 2020-04-22 DIAGNOSIS — G894 Chronic pain syndrome: Secondary | ICD-10-CM | POA: Diagnosis not present

## 2020-04-22 DIAGNOSIS — S22050D Wedge compression fracture of T5-T6 vertebra, subsequent encounter for fracture with routine healing: Secondary | ICD-10-CM | POA: Diagnosis not present

## 2020-04-26 ENCOUNTER — Encounter: Payer: Self-pay | Admitting: Nurse Practitioner

## 2020-04-26 ENCOUNTER — Other Ambulatory Visit: Payer: Self-pay

## 2020-04-26 ENCOUNTER — Telehealth (INDEPENDENT_AMBULATORY_CARE_PROVIDER_SITE_OTHER): Payer: Medicare Other | Admitting: Nurse Practitioner

## 2020-04-26 ENCOUNTER — Telehealth: Payer: Self-pay

## 2020-04-26 VITALS — Temp 99.0°F | Ht 64.0 in | Wt 112.0 lb

## 2020-04-26 DIAGNOSIS — J020 Streptococcal pharyngitis: Secondary | ICD-10-CM | POA: Diagnosis not present

## 2020-04-26 DIAGNOSIS — R52 Pain, unspecified: Secondary | ICD-10-CM | POA: Diagnosis not present

## 2020-04-26 DIAGNOSIS — R059 Cough, unspecified: Secondary | ICD-10-CM | POA: Diagnosis not present

## 2020-04-26 DIAGNOSIS — J029 Acute pharyngitis, unspecified: Secondary | ICD-10-CM | POA: Diagnosis not present

## 2020-04-26 LAB — POC COVID19 BINAXNOW: SARS Coronavirus 2 Ag: NEGATIVE

## 2020-04-26 LAB — POCT RAPID STREP A (OFFICE): Rapid Strep A Screen: POSITIVE — AB

## 2020-04-26 MED ORDER — AZITHROMYCIN 250 MG PO TABS: ORAL_TABLET | ORAL | 0 refills | Status: DC

## 2020-04-26 NOTE — Telephone Encounter (Signed)
Pt transferred to CMA. Pt complaining of sore throat hurting to swallow. Pt states her normal temperature is in 97 and her temperature is 99. Feels like shes been "hit be a bus." She is achy w/ headache.  Complains headache, sinus drainage, sore throat, achy, elevated temp, cough, diarrhea. Denies being around anyone sick. She was in [place] for a month with daughter due to daughter having surgery. Lives in home with husband who is asymptomatic. Symptoms started Wednesday.

## 2020-04-26 NOTE — Progress Notes (Signed)
Virtual Visit via Video Note   This visit type was conducted due to national recommendations for restrictions regarding the COVID-19 Pandemic (e.g. social distancing) in an effort to limit this patient's exposure and mitigate transmission in our community.  Due to her co-morbid illnesses, this patient is at least at moderate risk for complications without adequate follow up.  This format is felt to be most appropriate for this patient at this time.  All issues noted in this document were discussed and addressed.  A limited physical exam was performed with this format.  A verbal consent was obtained for the virtual visit.   Date:  04/26/2020   ID:  Ellen Holloway, DOB [birth year], MRN [id number]  Patient Location: Home Provider Location: Office/Clinic  PCP:  Blane Ohara, MD   Evaluation Performed:  Established patient, acute telemedicine visit  Chief Complaint:  Sore Throat    History of Present Illness:    Ellen Holloway is a 70 y.o. female with sore throat, rhinorrhea, fever, and cough. Onset of symptoms was 3-days-ago. Treatment has included OTC cold medication. She denies known exposure to ill-contacts. She has obtained COVID-19 and flu vaccines.   The patient does have symptoms concerning for COVID-19 infection (fever, chills, cough, or new shortness of breath).    Past Medical History:  Diagnosis Date  . Acquired absence of both cervix and uterus   . Age-related osteoporosis with current pathological fracture, unspecified site, initial encounter for fracture   . Barrett's esophagus with low grade dysplasia   . Chronic obstructive pulmonary disease (COPD) (HCC)   . Major depressive disorder, recurrent severe without psychotic features (HCC)   . Mixed hyperlipidemia   . Obstructive sleep apnea (adult) (pediatric)   . Other obesity due to excess calories   . Other pulmonary embolism without acute cor pulmonale (HCC)   . Other specified anxiety disorders   . Sleep related  leg cramps   . Telogen effluvium     Past Surgical History:  Procedure Laterality Date  . ABDOMINAL HYSTERECTOMY  1980   partial  . right wrist 1985      Family History  Problem Relation Age of Onset  . Obesity Mother   . Heart attack Father   . Renal Disease Brother     Social History   Socioeconomic History  . Marital status: Married    Spouse name: Not on file  . Number of children: Not on file  . Years of education: Not on file  . Highest education level: Not on file  Occupational History  . Not on file  Tobacco Use  . Smoking status: Current Every Day Smoker    Packs/day: 0.50    Years: 55.00    Pack years: 27.50    Types: E-cigarettes  . Smokeless tobacco: Never Used  . Tobacco comment: Patient used to smoke up to 2 PPD.  Substance and Sexual Activity  . Alcohol use: Never  . Drug use: Never  . Sexual activity: Not on file  Other Topics Concern  . Not on file  Social History Narrative  . Not on file   Social Determinants of Health   Financial Resource Strain: Not on file  Food Insecurity: No Food Insecurity  . Worried About Programme researcher, broadcasting/film/video in the Last Year: Never true  . Ran Out of Food in the Last Year: Never true  Transportation Needs: No Transportation Needs  . Lack of Transportation (Medical): No  . Lack of Transportation (Non-Medical): No  Physical Activity: Insufficiently Active  . Days of Exercise per Week: 3 days  . Minutes of Exercise per Session: 30 min  Stress: Not on file  Social Connections: Not on file  Intimate Partner Violence: Not on file    Outpatient Medications Prior to Visit  Medication Sig Dispense Refill  . albuterol (PROVENTIL) (2.5 MG/3ML) 0.083% nebulizer solution Take 2.5 mg by nebulization every 6 (six) hours as needed for wheezing or shortness of breath.    [name] atorvastatin (LIPITOR) 10 MG tablet TAKE 1 TABLET BY MOUTH EVERY DAY 90 tablet 1  . calcium carbonate (OSCAL) 1500 (600 Ca) MG TABS tablet Take 600 mg of  elemental calcium by mouth daily.    . clonazePAM (KLONOPIN) 1 MG tablet Take 1 tablet (1 mg total) by mouth 2 (two) times daily as needed for anxiety. 30 tablet 1  . cyclobenzaprine (FLEXERIL) 10 MG tablet Take 1 tablet (10 mg total) by mouth at bedtime. 30 tablet 2  . famotidine (PEPCID) 40 MG tablet TAKE 1 TABLET BY MOUTH EVERYDAY AT BEDTIME 90 tablet 1  . fexofenadine (ALLEGRA) 180 MG tablet Take 180 mg by mouth in the morning.    . Fluticasone-Umeclidin-Vilant (TRELEGY ELLIPTA) 100-62.5-25 MCG/INH AEPB Inhale 1 puff into the lungs daily as needed.    . furosemide (LASIX) 20 MG tablet TAKE 1 TABLET (20 MG TOTAL) BY MOUTH DAILY AS NEEDED. AS NEEDED FOR SWELLING 90 tablet 0  . montelukast (SINGULAIR) 10 MG tablet TAKE 1 TABLET BY MOUTH EVERYDAY AT BEDTIME 90 tablet 1  . oxyCODONE-acetaminophen (PERCOCET) 10-325 MG tablet Take 1 tablet by mouth every 6 (six) hours.    . OXYGEN Inhale into the lungs. 2 L at night.    . pantoprazole (PROTONIX) 40 MG tablet TAKE 1 TABLET BY MOUTH TWICE A DAY 180 tablet 0  . pramipexole (MIRAPEX) 1 MG tablet Take 1 tablet (1 mg total) by mouth at bedtime. 90 tablet 0  . PROLIA 60 MG/ML SOSY injection Inject into the skin.    . promethazine (PHENERGAN) 25 MG tablet Take 25 mg by mouth every 6 (six) hours as needed for nausea or vomiting.    . sertraline (ZOLOFT) 100 MG tablet TAKE 2 TABLETS BY MOUTH EVERY DAY 180 tablet 0  . spironolactone (ALDACTONE) 25 MG tablet Take 1 tablet (25 mg total) by mouth daily. 90 tablet 0  . VITAMIN D-VITAMIN K PO Take 1 tablet by mouth daily.     No facility-administered medications prior to visit.    Allergies:   Sulfamethoxazole and Sulfa antibiotics   Social History   Tobacco Use  . Smoking status: Current Every Day Smoker    Packs/day: 0.50    Years: 55.00    Pack years: 27.50    Types: E-cigarettes  . Smokeless tobacco: Never Used  . Tobacco comment: Patient used to smoke up to 2 PPD.  Substance Use Topics  . Alcohol  use: Never  . Drug use: Never     Review of Systems  Constitutional: Positive for fever and malaise/fatigue.  HENT: Positive for congestion and sore throat.        Rhinorrhea and post-nasal-drip  Eyes: Negative for pain.  Respiratory: Positive for cough. Negative for sputum production, shortness of breath and wheezing.   Cardiovascular: Negative for chest pain, palpitations and leg swelling.  Gastrointestinal: Positive for diarrhea. Negative for abdominal pain, constipation, nausea and vomiting.  Genitourinary: Negative for dysuria.  Musculoskeletal: Negative for joint pain and myalgias.  Skin: Negative for  rash.  Neurological: Positive for headaches.     Labs/Other Tests and Data Reviewed:    Recent Labs: 11/24/2019: ALT 15; BUN 12; Creatinine, Ser 0.81; Hemoglobin 13.7; Platelets 206; Potassium 4.4; Sodium 140   Recent Lipid Panel Lab Results  Component Value Date/Time   CHOL 156 08/23/2019 08:10 AM   TRIG 101 08/23/2019 08:10 AM   HDL 74 08/23/2019 08:10 AM   CHOLHDL 2.1 08/23/2019 08:10 AM   LDLCALC 64 08/23/2019 08:10 AM    Wt Readings from Last 3 Encounters:  11/24/19 125 lb (56.7 kg)  08/30/19 122 lb (55.3 kg)  08/23/19 121 lb 12.8 oz (55.2 kg)     Objective:    Vital Signs:  Temp 99 F (37.2 C)   Ht 5\' 4"  (1.626 m)   Wt 112 lb (50.8 kg)   BMI 19.22 kg/m    Physical Exam Vitals reviewed.    No physical exam performed due to telemedicine visit  ASSESSMENT & PLAN:    1. Strep pharyngitis - azithromycin (ZITHROMAX) 250 MG tablet; Take two tablets by mouth on day one, take one tablet by mouth days two-five  Dispense: 6 tablet; Refill: 0  2. Body aches - POC COVID-19  3. Sore throat - Rapid Strep A - POC COVID-19  4. Cough - POC COVID-19    COVID-19 Education: The signs and symptoms of COVID-19 were discussed with the patient and how to seek care for testing (follow up with PCP or arrange E-visit). The importance of social distancing was  discussed today.   I spent 20 minutes dedicated to the care of this patient on the date of this encounter to include face-to-face time with the patient, as well as:EMR review and prescription medication management  Follow Up:  Virtual Visit  prn  , DNP  04/30/2020 10:41 p.m.    [facility] 05/02/2020

## 2020-04-29 ENCOUNTER — Telehealth: Payer: Medicare Other | Admitting: Physician Assistant

## 2020-04-30 ENCOUNTER — Encounter: Payer: Self-pay | Admitting: Nurse Practitioner

## 2020-04-30 ENCOUNTER — Other Ambulatory Visit: Payer: Self-pay | Admitting: Family Medicine

## 2020-05-09 ENCOUNTER — Other Ambulatory Visit: Payer: Self-pay

## 2020-05-09 ENCOUNTER — Other Ambulatory Visit: Payer: Self-pay | Admitting: Family Medicine

## 2020-05-09 ENCOUNTER — Encounter: Payer: Self-pay | Admitting: Family Medicine

## 2020-05-09 ENCOUNTER — Ambulatory Visit (INDEPENDENT_AMBULATORY_CARE_PROVIDER_SITE_OTHER): Payer: Medicare Other | Admitting: Family Medicine

## 2020-05-09 VITALS — BP 118/72 | HR 96 | Temp 97.5°F | Resp 18 | Ht 64.0 in | Wt 113.4 lb

## 2020-05-09 DIAGNOSIS — M816 Localized osteoporosis [Lequesne]: Secondary | ICD-10-CM

## 2020-05-09 DIAGNOSIS — R059 Cough, unspecified: Secondary | ICD-10-CM

## 2020-05-09 DIAGNOSIS — E782 Mixed hyperlipidemia: Secondary | ICD-10-CM | POA: Diagnosis not present

## 2020-05-09 DIAGNOSIS — F330 Major depressive disorder, recurrent, mild: Secondary | ICD-10-CM | POA: Diagnosis not present

## 2020-05-09 DIAGNOSIS — J410 Simple chronic bronchitis: Secondary | ICD-10-CM | POA: Diagnosis not present

## 2020-05-09 DIAGNOSIS — J301 Allergic rhinitis due to pollen: Secondary | ICD-10-CM

## 2020-05-09 NOTE — Progress Notes (Signed)
Subjective:  Patient ID: Ellen Holloway, female    DOB: [birth year]  Age: 70 y.o. MRN: [id number]  Chief Complaint  Patient presents with  . Hyperlipidemia  . Hypertension    HPI Mixed hyperlipidemia -  Taking lipitor daily, maintains a healthy diet. Exercising.  Simple chronic bronchitis (HCC) - On trelegy one inhalation daily, singulair 10 mg once daily.   Depression, major, recurrent, mild (HCC) Controlled with sertraline and clonazepam. Is feeling much better than a year ago.  Eating better.   Osteoporosis-Has prolia injections every 6 months and taking calcium daily  GERD- controlled with protonix one twice a day and pepcid 40 mg once daily at night.   Chronic Back Pain - See's pain clinic-prescribes percocet  Seasonal Allergy- Taking allegra and singulair.    Current Outpatient Medications on File Prior to Visit  Medication Sig Dispense Refill  . albuterol (PROVENTIL) (2.5 MG/3ML) 0.083% nebulizer solution Take 2.5 mg by nebulization every 6 (six) hours as needed for wheezing or shortness of breath.    [name] atorvastatin (LIPITOR) 10 MG tablet TAKE 1 TABLET BY MOUTH EVERY DAY 90 tablet 1  . calcium carbonate (OSCAL) 1500 (600 Ca) MG TABS tablet Take 600 mg of elemental calcium by mouth daily.    . clonazePAM (KLONOPIN) 1 MG tablet Take 1 tablet (1 mg total) by mouth 2 (two) times daily as needed for anxiety. 30 tablet 1  . cyclobenzaprine (FLEXERIL) 10 MG tablet TAKE 1 TABLET BY MOUTH EVERYDAY AT BEDTIME 30 tablet 2  . famotidine (PEPCID) 40 MG tablet TAKE 1 TABLET BY MOUTH EVERYDAY AT BEDTIME 90 tablet 1  . fexofenadine (ALLEGRA) 180 MG tablet Take 180 mg by mouth in the morning.    . Fluticasone-Umeclidin-Vilant (TRELEGY ELLIPTA) 100-62.5-25 MCG/INH AEPB Inhale 1 puff into the lungs daily as needed.    . furosemide (LASIX) 20 MG tablet TAKE 1 TABLET (20 MG TOTAL) BY MOUTH DAILY AS NEEDED. AS NEEDED FOR SWELLING 90 tablet 0  . montelukast (SINGULAIR) 10 MG tablet TAKE 1  TABLET BY MOUTH EVERYDAY AT BEDTIME 90 tablet 1  . oxyCODONE-acetaminophen (PERCOCET) 10-325 MG tablet Take 1 tablet by mouth every 6 (six) hours.    . OXYGEN Inhale into the lungs. 2 L at night.    . pantoprazole (PROTONIX) 40 MG tablet TAKE 1 TABLET BY MOUTH TWICE A DAY 180 tablet 0  . pramipexole (MIRAPEX) 1 MG tablet Take 1 tablet (1 mg total) by mouth at bedtime. 90 tablet 0  . PROLIA 60 MG/ML SOSY injection Inject into the skin.    . promethazine (PHENERGAN) 25 MG tablet Take 25 mg by mouth every 6 (six) hours as needed for nausea or vomiting.    . sertraline (ZOLOFT) 100 MG tablet TAKE 2 TABLETS BY MOUTH EVERY DAY 180 tablet 0  . spironolactone (ALDACTONE) 25 MG tablet Take 1 tablet (25 mg total) by mouth daily. 90 tablet 0  . VITAMIN D-VITAMIN K PO Take 1 tablet by mouth daily.     No current facility-administered medications on file prior to visit.   Past Medical History:  Diagnosis Date  . Acquired absence of both cervix and uterus   . Age-related osteoporosis with current pathological fracture, unspecified site, initial encounter for fracture   . Barrett's esophagus with low grade dysplasia   . Chronic obstructive pulmonary disease (COPD) (HCC)   . Major depressive disorder, recurrent severe without psychotic features (HCC)   . Mixed hyperlipidemia   . Obstructive sleep apnea (adult) (  pediatric)   . Other obesity due to excess calories   . Other pulmonary embolism without acute cor pulmonale (HCC)   . Other specified anxiety disorders   . Sleep related leg cramps   . Telogen effluvium    Past Surgical History:  Procedure Laterality Date  . ABDOMINAL HYSTERECTOMY  1980   partial  . right wrist 1985      Family History  Problem Relation Age of Onset  . Obesity Mother   . Heart attack Father   . Renal Disease Brother    Social History   Socioeconomic History  . Marital status: Married    Spouse name: Not on file  . Number of children: Not on file  . Years of  education: Not on file  . Highest education level: Not on file  Occupational History  . Not on file  Tobacco Use  . Smoking status: Former Smoker    Packs/day: 0.50    Years: 55.00    Pack years: 27.50    Types: E-cigarettes  . Smokeless tobacco: Never Used  . Tobacco comment: Patient used to smoke up to 2 PPD.  Substance and Sexual Activity  . Alcohol use: Never  . Drug use: Never  . Sexual activity: Not on file  Other Topics Concern  . Not on file  Social History Narrative  . Not on file   Social Determinants of Health   Financial Resource Strain: Not on file  Food Insecurity: No Food Insecurity  . Worried About Programme researcher, broadcasting/film/video in the Last Year: Never true  . Ran Out of Food in the Last Year: Never true  Transportation Needs: No Transportation Needs  . Lack of Transportation (Medical): No  . Lack of Transportation (Non-Medical): No  Physical Activity: Insufficiently Active  . Days of Exercise per Week: 3 days  . Minutes of Exercise per Session: 30 min  Stress: Not on file  Social Connections: Not on file    Review of Systems  Constitutional: Negative for chills, fatigue and fever.  HENT: Negative for congestion, rhinorrhea and sore throat.   Respiratory: Positive for cough. Negative for choking and shortness of breath.   Cardiovascular: Negative for chest pain and palpitations.  Gastrointestinal: Negative for abdominal pain, constipation, diarrhea, nausea and vomiting.  Genitourinary: Negative for dysuria and urgency.  Musculoskeletal: Positive for back pain. Negative for myalgias.  Neurological: Negative for dizziness, weakness, light-headedness and headaches.  Psychiatric/Behavioral: Negative for dysphoric mood. The patient is not nervous/anxious.      Objective:  BP 118/72   Pulse 96   Temp (!) 97.5 F (36.4 C)   Resp 18   Ht 5\' 4"  (1.626 m)   Wt 113 lb 6.4 oz (51.4 kg)   BMI 19.47 kg/m   BP/Weight 05/09/2020 04/26/2020 11/24/2019  Systolic BP 118 -  122  Diastolic BP 72 - 68  Wt. (Lbs) 113.4 112 125  BMI 19.47 19.22 21.46    Physical Exam Vitals reviewed.  Constitutional:      Appearance: Normal appearance. She is normal weight.  Neck:     Vascular: No carotid bruit.  Cardiovascular:     Rate and Rhythm: Normal rate and regular rhythm.     Pulses: Normal pulses.     Heart sounds: Normal heart sounds.  Pulmonary:     Effort: Pulmonary effort is normal. No respiratory distress.     Breath sounds: Normal breath sounds.  Abdominal:     General: Abdomen is flat. Bowel sounds  are normal.     Palpations: Abdomen is soft.     Tenderness: There is no abdominal tenderness.  Neurological:     Mental Status: She is alert and oriented to person, place, and time.  Psychiatric:        Mood and Affect: Mood normal.        Behavior: Behavior normal.     Lab Results  Component Value Date   WBC 6.9 11/24/2019   HGB 13.7 11/24/2019   HCT 40.9 11/24/2019   PLT 206 11/24/2019   GLUCOSE 91 11/24/2019   CHOL 156 08/23/2019   TRIG 101 08/23/2019   HDL 74 08/23/2019   LDLCALC 64 08/23/2019   ALT 15 11/24/2019   AST 17 11/24/2019   NA 140 11/24/2019   K 4.4 11/24/2019   CL 102 11/24/2019   CREATININE 0.81 11/24/2019   BUN 12 11/24/2019   CO2 26 11/24/2019      Assessment & Plan:   1. Mixed hyperlipidemia Continue healthy diet.  Continue statin. - CBC with Differential/Platelet - Comprehensive metabolic panel - Lipid panel  2. Simple chronic bronchitis (HCC) Start trelegy one inhalation daily. Peak flows not at goal.   3. Depression, major, recurrent, mild (HCC) Well controlled.  The current medical regimen is effective;  continue present plan and medications.  4. Cough Likely secondary to copd not at goal.  5. Localized osteoporosis without current pathological fracture Continue present plan and medications. CCM working with patient on prolia shots.    Orders Placed This Encounter  Procedures  . Peak flow  meter  . CBC with Differential/Platelet  . Comprehensive metabolic panel  . Lipid panel     Follow-up: Return in about 3 months (around 08/06/2020) for fasting.  An After Visit Summary was printed and given to the patient.  Blane Ohara, MD [facility] [phone]

## 2020-05-10 LAB — CBC WITH DIFFERENTIAL/PLATELET
Basophils Absolute: 0 10*3/uL (ref 0.0–0.2)
Basos: 0 %
EOS (ABSOLUTE): 0.2 10*3/uL (ref 0.0–0.4)
Eos: 2 %
Hematocrit: 41.3 % (ref 34.0–46.6)
Hemoglobin: 13.6 g/dL (ref 11.1–15.9)
Immature Grans (Abs): 0 10*3/uL (ref 0.0–0.1)
Immature Granulocytes: 0 %
Lymphocytes Absolute: 3.1 10*3/uL (ref 0.7–3.1)
Lymphs: 34 %
MCH: 30.3 pg (ref 26.6–33.0)
MCHC: 32.9 g/dL (ref 31.5–35.7)
MCV: 92 fL (ref 79–97)
Monocytes Absolute: 0.7 10*3/uL (ref 0.1–0.9)
Monocytes: 8 %
Neutrophils Absolute: 5 10*3/uL (ref 1.4–7.0)
Neutrophils: 56 %
Platelets: 265 10*3/uL (ref 150–450)
RBC: 4.49 x10E6/uL (ref 3.77–5.28)
RDW: 12.3 % (ref 11.7–15.4)
WBC: 9.1 10*3/uL (ref 3.4–10.8)

## 2020-05-10 LAB — COMPREHENSIVE METABOLIC PANEL
ALT: 14 IU/L (ref 0–32)
AST: 19 IU/L (ref 0–40)
Albumin/Globulin Ratio: 1.6 (ref 1.2–2.2)
Albumin: 4.2 g/dL (ref 3.7–4.7)
Alkaline Phosphatase: 90 IU/L (ref 44–121)
BUN/Creatinine Ratio: 16 (ref 12–28)
BUN: 13 mg/dL (ref 8–27)
Bilirubin Total: 0.3 mg/dL (ref 0.0–1.2)
CO2: 27 mmol/L (ref 20–29)
Calcium: 9.3 mg/dL (ref 8.7–10.3)
Chloride: 98 mmol/L (ref 96–106)
Creatinine, Ser: 0.79 mg/dL (ref 0.57–1.00)
GFR calc Af Amer: 87 mL/min/{1.73_m2} (ref >59)
GFR calc non Af Amer: 76 mL/min/{1.73_m2} (ref >59)
Globulin, Total: 2.6 g/dL (ref 1.5–4.5)
Glucose: 103 mg/dL — ABNORMAL HIGH (ref 65–99)
Potassium: 4.5 mmol/L (ref 3.5–5.2)
Sodium: 139 mmol/L (ref 134–144)
Total Protein: 6.8 g/dL (ref 6.0–8.5)

## 2020-05-10 LAB — CARDIOVASCULAR RISK ASSESSMENT

## 2020-05-10 LAB — LIPID PANEL
Chol/HDL Ratio: 2.4 ratio (ref 0.0–4.4)
Cholesterol, Total: 186 mg/dL (ref 100–199)
HDL: 76 mg/dL (ref >39)
LDL Chol Calc (NIH): 90 mg/dL (ref 0–99)
Triglycerides: 114 mg/dL (ref 0–149)
VLDL Cholesterol Cal: 20 mg/dL (ref 5–40)

## 2020-05-20 DIAGNOSIS — S3210XA Unspecified fracture of sacrum, initial encounter for closed fracture: Secondary | ICD-10-CM | POA: Diagnosis not present

## 2020-05-20 DIAGNOSIS — G894 Chronic pain syndrome: Secondary | ICD-10-CM | POA: Diagnosis not present

## 2020-05-20 DIAGNOSIS — S22050D Wedge compression fracture of T5-T6 vertebra, subsequent encounter for fracture with routine healing: Secondary | ICD-10-CM | POA: Diagnosis not present

## 2020-05-20 DIAGNOSIS — M5416 Radiculopathy, lumbar region: Secondary | ICD-10-CM | POA: Diagnosis not present

## 2020-05-20 DIAGNOSIS — M47816 Spondylosis without myelopathy or radiculopathy, lumbar region: Secondary | ICD-10-CM | POA: Diagnosis not present

## 2020-05-20 DIAGNOSIS — R202 Paresthesia of skin: Secondary | ICD-10-CM | POA: Diagnosis not present

## 2020-05-20 DIAGNOSIS — Z1389 Encounter for screening for other disorder: Secondary | ICD-10-CM | POA: Diagnosis not present

## 2020-05-21 DIAGNOSIS — G4733 Obstructive sleep apnea (adult) (pediatric): Secondary | ICD-10-CM | POA: Diagnosis not present

## 2020-05-21 DIAGNOSIS — J301 Allergic rhinitis due to pollen: Secondary | ICD-10-CM | POA: Diagnosis not present

## 2020-05-21 DIAGNOSIS — J4530 Mild persistent asthma, uncomplicated: Secondary | ICD-10-CM | POA: Diagnosis not present

## 2020-05-22 ENCOUNTER — Other Ambulatory Visit: Payer: Self-pay | Admitting: Physician Assistant

## 2020-06-15 ENCOUNTER — Other Ambulatory Visit: Payer: Self-pay | Admitting: Physician Assistant

## 2020-06-15 DIAGNOSIS — F321 Major depressive disorder, single episode, moderate: Secondary | ICD-10-CM

## 2020-06-17 DIAGNOSIS — S3210XA Unspecified fracture of sacrum, initial encounter for closed fracture: Secondary | ICD-10-CM | POA: Diagnosis not present

## 2020-06-17 DIAGNOSIS — M5416 Radiculopathy, lumbar region: Secondary | ICD-10-CM | POA: Diagnosis not present

## 2020-06-17 DIAGNOSIS — M47816 Spondylosis without myelopathy or radiculopathy, lumbar region: Secondary | ICD-10-CM | POA: Diagnosis not present

## 2020-06-17 DIAGNOSIS — R202 Paresthesia of skin: Secondary | ICD-10-CM | POA: Diagnosis not present

## 2020-06-17 DIAGNOSIS — S22050D Wedge compression fracture of T5-T6 vertebra, subsequent encounter for fracture with routine healing: Secondary | ICD-10-CM | POA: Diagnosis not present

## 2020-06-17 DIAGNOSIS — G894 Chronic pain syndrome: Secondary | ICD-10-CM | POA: Diagnosis not present

## 2020-06-17 DIAGNOSIS — Z1389 Encounter for screening for other disorder: Secondary | ICD-10-CM | POA: Diagnosis not present

## 2020-06-20 ENCOUNTER — Other Ambulatory Visit: Payer: Self-pay | Admitting: Family Medicine

## 2020-06-20 DIAGNOSIS — K2100 Gastro-esophageal reflux disease with esophagitis, without bleeding: Secondary | ICD-10-CM

## 2020-06-21 DIAGNOSIS — G4733 Obstructive sleep apnea (adult) (pediatric): Secondary | ICD-10-CM | POA: Diagnosis not present

## 2020-06-21 DIAGNOSIS — J4530 Mild persistent asthma, uncomplicated: Secondary | ICD-10-CM | POA: Diagnosis not present

## 2020-06-21 DIAGNOSIS — J301 Allergic rhinitis due to pollen: Secondary | ICD-10-CM | POA: Diagnosis not present

## 2020-06-25 ENCOUNTER — Telehealth: Payer: Self-pay

## 2020-06-25 NOTE — Progress Notes (Signed)
    Chronic Care Management Pharmacy Assistant   Name: Ellen Holloway  MRN: [id number] DOB: Oct 06, 1950  Reason for Encounter: Disease State for COPD   Recent office visits:  05/09/20, Dr. Sedalia Muta, PCP  04/22/20-John Rice, chronic pain syndrome, fracture of sacrum   Recent consult visits:  None  Hospital visits:  None in previous 6 months  Medications: Outpatient Encounter Medications as of 06/25/2020  Medication Sig  . albuterol (PROVENTIL) (2.5 MG/3ML) 0.083% nebulizer solution Take 2.5 mg by nebulization every 6 (six) hours as needed for wheezing or shortness of breath.  [name] atorvastatin (LIPITOR) 10 MG tablet TAKE 1 TABLET BY MOUTH EVERY DAY  . calcium carbonate (OSCAL) 1500 (600 Ca) MG TABS tablet Take 600 mg of elemental calcium by mouth daily.  . clonazePAM (KLONOPIN) 1 MG tablet Take 1 tablet (1 mg total) by mouth 2 (two) times daily as needed for anxiety.  . cyclobenzaprine (FLEXERIL) 10 MG tablet TAKE 1 TABLET BY MOUTH EVERYDAY AT BEDTIME  . famotidine (PEPCID) 40 MG tablet TAKE 1 TABLET BY MOUTH EVERYDAY AT BEDTIME  . fexofenadine (ALLEGRA) 180 MG tablet Take 180 mg by mouth in the morning.  . Fluticasone-Umeclidin-Vilant (TRELEGY ELLIPTA) 100-62.5-25 MCG/INH AEPB Inhale 1 puff into the lungs daily as needed.  . furosemide (LASIX) 20 MG tablet TAKE 1 TABLET (20 MG TOTAL) BY MOUTH DAILY AS NEEDED. AS NEEDED FOR SWELLING  . montelukast (SINGULAIR) 10 MG tablet TAKE 1 TABLET BY MOUTH EVERYDAY AT BEDTIME  . oxyCODONE-acetaminophen (PERCOCET) 10-325 MG tablet Take 1 tablet by mouth every 6 (six) hours.  . OXYGEN Inhale into the lungs. 2 L at night.  . pantoprazole (PROTONIX) 40 MG tablet TAKE 1 TABLET BY MOUTH TWICE A DAY  . pramipexole (MIRAPEX) 1 MG tablet Take 1 tablet (1 mg total) by mouth at bedtime.  [name] PROLIA 60 MG/ML SOSY injection Inject into the skin.  . promethazine (PHENERGAN) 25 MG tablet Take 25 mg by mouth every 6 (six) hours as needed for nausea or vomiting.  .  sertraline (ZOLOFT) 100 MG tablet TAKE 2 TABLETS BY MOUTH EVERY DAY  . spironolactone (ALDACTONE) 25 MG tablet TAKE 1 TABLET BY MOUTH EVERY DAY  . VITAMIN D-VITAMIN K PO Take 1 tablet by mouth daily.   No facility-administered encounter medications on file as of 06/25/2020.    Patient is taking Trelegy and Singuliar for COPD  Have been unable to reach patient.  Next appointment scheduled for: 08/13/20 Dr. Sedalia Muta  Star Medications: Atorvastatin       04/07/20     90ds  Care gaps Dec 21 2016 PNA vac Low Risk Adult (2 of 2 - PCV13) Last completed: Dec 22, 2015   Leilani Able, [facility] Clinical Pharmacist Assistant [phone]

## 2020-07-03 DIAGNOSIS — J301 Allergic rhinitis due to pollen: Secondary | ICD-10-CM | POA: Diagnosis not present

## 2020-07-03 DIAGNOSIS — G4733 Obstructive sleep apnea (adult) (pediatric): Secondary | ICD-10-CM | POA: Diagnosis not present

## 2020-07-08 ENCOUNTER — Other Ambulatory Visit: Payer: Self-pay | Admitting: Family Medicine

## 2020-07-08 ENCOUNTER — Other Ambulatory Visit: Payer: Self-pay | Admitting: Physician Assistant

## 2020-07-10 ENCOUNTER — Other Ambulatory Visit: Payer: Self-pay

## 2020-07-10 ENCOUNTER — Ambulatory Visit (INDEPENDENT_AMBULATORY_CARE_PROVIDER_SITE_OTHER): Payer: Medicare Other

## 2020-07-10 DIAGNOSIS — M816 Localized osteoporosis [Lequesne]: Secondary | ICD-10-CM | POA: Diagnosis not present

## 2020-07-10 MED ORDER — DENOSUMAB 60 MG/ML ~~LOC~~ SOSY
60.0000 mg | PREFILLED_SYRINGE | Freq: Once | SUBCUTANEOUS | Status: AC
  Administered 2020-07-10: 60 mg via SUBCUTANEOUS

## 2020-07-10 NOTE — Progress Notes (Signed)
Patient was given prolia injection and tolerated well.  

## 2020-07-15 ENCOUNTER — Telehealth: Payer: Self-pay

## 2020-07-15 NOTE — Progress Notes (Signed)
Chronic Care Management Pharmacy Assistant   Name: CHELSEI MCCHESNEY  MRN: [id number] DOB: [birth year]   Reason for Encounter: Disease State for COPD  Recent office visits:  07/10/20- Prolia injection  05/09/20-Dr. Cox PCP, Start trelegy one inhalation daily,Labs great.   04/26/20-Shannon Heaton NP- strep throat, start Azithromycin,   Recent consult visits:  none  Hospital visits:  None in previous 6 months  Medications: Outpatient Encounter Medications as of 07/15/2020  Medication Sig  . albuterol (PROVENTIL) (2.5 MG/3ML) 0.083% nebulizer solution Take 2.5 mg by nebulization every 6 (six) hours as needed for wheezing or shortness of breath.  [name] atorvastatin (LIPITOR) 10 MG tablet TAKE 1 TABLET BY MOUTH EVERY DAY  . calcium carbonate (OSCAL) 1500 (600 Ca) MG TABS tablet Take 600 mg of elemental calcium by mouth daily.  . clonazePAM (KLONOPIN) 1 MG tablet Take 1 tablet (1 mg total) by mouth 2 (two) times daily as needed for anxiety.  . cyclobenzaprine (FLEXERIL) 10 MG tablet TAKE 1 TABLET BY MOUTH EVERYDAY AT BEDTIME  . famotidine (PEPCID) 40 MG tablet TAKE 1 TABLET BY MOUTH EVERYDAY AT BEDTIME  . fexofenadine (ALLEGRA) 180 MG tablet Take 180 mg by mouth in the morning.  . Fluticasone-Umeclidin-Vilant (TRELEGY ELLIPTA) 100-62.5-25 MCG/INH AEPB Inhale 1 puff into the lungs daily as needed.  . furosemide (LASIX) 20 MG tablet TAKE 1 TABLET (20 MG TOTAL) BY MOUTH DAILY AS NEEDED. AS NEEDED FOR SWELLING  . montelukast (SINGULAIR) 10 MG tablet TAKE 1 TABLET BY MOUTH EVERYDAY AT BEDTIME  . oxyCODONE-acetaminophen (PERCOCET) 10-325 MG tablet Take 1 tablet by mouth every 6 (six) hours.  . OXYGEN Inhale into the lungs. 2 L at night.  . pantoprazole (PROTONIX) 40 MG tablet TAKE 1 TABLET BY MOUTH TWICE A DAY  . pramipexole (MIRAPEX) 1 MG tablet TAKE 1 TABLET BY MOUTH AT BEDTIME.  [name] PROLIA 60 MG/ML SOSY injection Inject into the skin.  . promethazine (PHENERGAN) 25 MG tablet Take 25 mg by mouth  every 6 (six) hours as needed for nausea or vomiting.  . sertraline (ZOLOFT) 100 MG tablet TAKE 2 TABLETS BY MOUTH EVERY DAY  . spironolactone (ALDACTONE) 25 MG tablet TAKE 1 TABLET BY MOUTH EVERY DAY  . VITAMIN D-VITAMIN K PO Take 1 tablet by mouth daily.   No facility-administered encounter medications on file as of 07/15/2020.    . Current COPD regimen:  Patient is taking Trelegy and Singuliar for COPD  No flowsheet data found.  . Any recent hospitalizations or ED visits since last visit with CPP? No, patient has not had any hospital visits  . denies COPD symptoms, including none, patient states she is doing very well this year.  Patient said the inhalers are working, she said she is doing very well.     . What recent interventions/DTPs have been made by any provider to improve breathing since last visit:  Patient is taking her medication as directed.  She uses her rescue inhaler very little.  She said pulmonologist thinks she may not have COPD anymore.  She is very excited.     . Have you had exacerbation/flare-up since last visit? No  . What do you do when you are short of breath?  Rest, patient stated this usually happens when she brings trash can back from the road, she has a long walk.    . Current tobacco use? o Interested in cessation? No, patient is not a smoker  Adherence Review: . Does the patient have >5  day gap between last estimated fill date for maintenance inhaler medications? CPP to review  Star Rating Drugs: Atorvastatin       04/07/20     90ds  Leilani Able, [facility] Clinical Pharmacist Assistant [phone]

## 2020-07-17 DIAGNOSIS — R634 Abnormal weight loss: Secondary | ICD-10-CM | POA: Diagnosis not present

## 2020-07-17 DIAGNOSIS — K22719 Barrett's esophagus with dysplasia, unspecified: Secondary | ICD-10-CM | POA: Diagnosis not present

## 2020-07-18 DIAGNOSIS — G894 Chronic pain syndrome: Secondary | ICD-10-CM | POA: Diagnosis not present

## 2020-07-18 DIAGNOSIS — S22050D Wedge compression fracture of T5-T6 vertebra, subsequent encounter for fracture with routine healing: Secondary | ICD-10-CM | POA: Diagnosis not present

## 2020-07-18 DIAGNOSIS — R202 Paresthesia of skin: Secondary | ICD-10-CM | POA: Diagnosis not present

## 2020-07-18 DIAGNOSIS — S3210XA Unspecified fracture of sacrum, initial encounter for closed fracture: Secondary | ICD-10-CM | POA: Diagnosis not present

## 2020-07-18 DIAGNOSIS — Z1389 Encounter for screening for other disorder: Secondary | ICD-10-CM | POA: Diagnosis not present

## 2020-07-18 DIAGNOSIS — M47816 Spondylosis without myelopathy or radiculopathy, lumbar region: Secondary | ICD-10-CM | POA: Diagnosis not present

## 2020-07-18 DIAGNOSIS — M5416 Radiculopathy, lumbar region: Secondary | ICD-10-CM | POA: Diagnosis not present

## 2020-07-27 ENCOUNTER — Other Ambulatory Visit: Payer: Self-pay | Admitting: Family Medicine

## 2020-07-29 ENCOUNTER — Ambulatory Visit: Payer: Self-pay | Admitting: Allergy and Immunology

## 2020-08-02 ENCOUNTER — Other Ambulatory Visit: Payer: Self-pay | Admitting: Family Medicine

## 2020-08-12 NOTE — Progress Notes (Signed)
Subjective:  Patient ID: Ellen Holloway, female    DOB: 23-May-1950  Age: 70 y.o. MRN: [id number]  Chief Complaint  Patient presents with   Depression   Hyperlipidemia    HPI COPD: on Trelegy and albuterol nebulizer. Sees Dr. Terrilee Croak office. On oxygen at 2 L.  Depression, mild, recurrent: On zoloft 100 mg two daily. Uses clonazepam sparingly.  RLS: On mirapex 1 mg once daily at night.  Hyperlipidemia: on lipitor 10 mg once daily. Eats healthy and is active.  Pain clinic: [facility]. ON percocet 10/325 mg one four times a day.  OSA: Has not been using cpap. Pt sees Dr. Terrilee Croak midlevel provider. Pt has not noticed any change in her energy. Osteoporosis: Prolia every 6 months, vitamin D once daily, On calcium. Gerd/Barrett's with low grade dysplasia: protonix 40 mg one twice a day and pepcid 40 mg once daily. Colonoscopy every 3 yrs due in 2025. GI - Dr. Jennye Boroughs.   Current Outpatient Medications on File Prior to Visit  Medication Sig Dispense Refill   albuterol (PROVENTIL) (2.5 MG/3ML) 0.083% nebulizer solution Take 2.5 mg by nebulization every 6 (six) hours as needed for wheezing or shortness of breath.     atorvastatin (LIPITOR) 10 MG tablet TAKE 1 TABLET BY MOUTH EVERY DAY 90 tablet 1   calcium carbonate (OSCAL) 1500 (600 Ca) MG TABS tablet Take 600 mg of elemental calcium by mouth daily.     clonazePAM (KLONOPIN) 1 MG tablet Take 1 tablet (1 mg total) by mouth 2 (two) times daily as needed for anxiety. 30 tablet 1   cyclobenzaprine (FLEXERIL) 10 MG tablet TAKE 1 TABLET BY MOUTH EVERYDAY AT BEDTIME 30 tablet 2   famotidine (PEPCID) 40 MG tablet TAKE 1 TABLET BY MOUTH EVERYDAY AT BEDTIME 90 tablet 2   fexofenadine (ALLEGRA) 180 MG tablet Take 180 mg by mouth in the morning.     Fluticasone-Umeclidin-Vilant (TRELEGY ELLIPTA) 100-62.5-25 MCG/INH AEPB Inhale 1 puff into the lungs daily as needed.     furosemide (LASIX) 20 MG tablet TAKE 1 TABLET (20 MG TOTAL) BY MOUTH DAILY AS NEEDED. AS  NEEDED FOR SWELLING 90 tablet 0   montelukast (SINGULAIR) 10 MG tablet TAKE 1 TABLET BY MOUTH EVERYDAY AT BEDTIME 90 tablet 3   oxyCODONE-acetaminophen (PERCOCET) 10-325 MG tablet Take 1 tablet by mouth every 6 (six) hours.     OXYGEN Inhale into the lungs. 2 L at night.     pantoprazole (PROTONIX) 40 MG tablet TAKE 1 TABLET BY MOUTH TWICE A DAY 180 tablet 3   pramipexole (MIRAPEX) 1 MG tablet TAKE 1 TABLET BY MOUTH AT BEDTIME. 90 tablet 0   PROLIA 60 MG/ML SOSY injection Inject into the skin.     promethazine (PHENERGAN) 25 MG tablet Take 25 mg by mouth every 6 (six) hours as needed for nausea or vomiting.     sertraline (ZOLOFT) 100 MG tablet TAKE 2 TABLETS BY MOUTH EVERY DAY 180 tablet 0   spironolactone (ALDACTONE) 25 MG tablet TAKE 1 TABLET BY MOUTH EVERY DAY 90 tablet 2   VITAMIN D-VITAMIN K PO Take 1 tablet by mouth daily.     No current facility-administered medications on file prior to visit.   Past Medical History:  Diagnosis Date   Acquired absence of both cervix and uterus    Age-related osteoporosis with current pathological fracture, unspecified site, initial encounter for fracture    Barrett's esophagus with low grade dysplasia    Chronic obstructive pulmonary disease (COPD) (HCC)  Major depressive disorder, recurrent severe without psychotic features (HCC)    Mixed hyperlipidemia    Obstructive sleep apnea (adult) (pediatric)    Other obesity due to excess calories    Other pulmonary embolism without acute cor pulmonale (HCC)    Other specified anxiety disorders    Sleep related leg cramps    Telogen effluvium    Past Surgical History:  Procedure Laterality Date   ABDOMINAL HYSTERECTOMY  1980   partial   right wrist 1985      Family History  Problem Relation Age of Onset   Obesity Mother    Heart attack Father    Renal Disease Brother    Social History   Socioeconomic History   Marital status: Married    Spouse name: Not on file   Number of children:  Not on file   Years of education: Not on file   Highest education level: Not on file  Occupational History   Not on file  Tobacco Use   Smoking status: Former Smoker    Packs/day: 0.50    Years: 55.00    Pack years: 27.50    Types: E-cigarettes   Smokeless tobacco: Never Used   Tobacco comment: Patient used to smoke up to 2 PPD.  Substance and Sexual Activity   Alcohol use: Never   Drug use: Never   Sexual activity: Not on file  Other Topics Concern   Not on file  Social History Narrative   Not on file   Social Determinants of Health   Financial Resource Strain: Not on file  Food Insecurity: No Food Insecurity   Worried About Running Out of Food in the Last Year: Never true   Ran Out of Food in the Last Year: Never true  Transportation Needs: No Transportation Needs   Lack of Transportation (Medical): No   Lack of Transportation (Non-Medical): No  Physical Activity: Insufficiently Active   Days of Exercise per Week: 3 days   Minutes of Exercise per Session: 30 min  Stress: Not on file  Social Connections: Not on file    Review of Systems  Constitutional: Negative for chills, fatigue and fever.  HENT: Negative for congestion, ear pain and sore throat.   Respiratory: Positive for cough and shortness of breath (with significant exertion.).   Cardiovascular: Negative for chest pain and palpitations.  Gastrointestinal: Negative for abdominal pain, constipation, diarrhea, nausea and vomiting.  Endocrine: Negative for polydipsia and polyphagia.  Genitourinary: Negative for difficulty urinating and dysuria.  Musculoskeletal: Positive for arthralgias (right elbow swelling x 1 week. Painful. No trauma remembered. ). Negative for back pain and myalgias.  Skin: Negative for rash.  Neurological: Negative for headaches.  Psychiatric/Behavioral: Negative for dysphoric mood. The patient is not nervous/anxious.      Objective:  BP 114/60   Pulse 76   Temp 97.7 F (36.5 C)    Resp 16   Ht 5\' 4"  (1.626 m)   Wt 115 lb (52.2 kg)   BMI 19.74 kg/m   BP/Weight 08/13/2020 05/09/2020 04/26/2020  Systolic BP 114 118 -  Diastolic BP 60 72 -  Wt. (Lbs) 115 113.4 112  BMI 19.74 19.47 19.22    Physical Exam Vitals reviewed.  Constitutional:      Appearance: Normal appearance. She is underweight.  Neck:     Vascular: No carotid bruit.  Cardiovascular:     Rate and Rhythm: Normal rate and regular rhythm.     Pulses: Normal pulses.  Heart sounds: Normal heart sounds.  Pulmonary:     Effort: Pulmonary effort is normal.     Breath sounds: Normal breath sounds.  Abdominal:     General: Bowel sounds are normal.     Palpations: Abdomen is soft.     Tenderness: There is no abdominal tenderness.  Musculoskeletal:     Comments: Right olecranon bursa nontender. Fluid Filled.  Neurological:     Mental Status: She is alert and oriented to person, place, and time.  Psychiatric:        Mood and Affect: Mood normal.        Behavior: Behavior normal.     Diabetic Foot Exam - Simple   No data filed      Lab Results  Component Value Date   WBC 9.1 05/09/2020   HGB 13.6 05/09/2020   HCT 41.3 05/09/2020   PLT 265 05/09/2020   GLUCOSE 103 (H) 05/09/2020   CHOL 186 05/09/2020   TRIG 114 05/09/2020   HDL 76 05/09/2020   LDLCALC 90 05/09/2020   ALT 14 05/09/2020   AST 19 05/09/2020   NA 139 05/09/2020   K 4.5 05/09/2020   CL 98 05/09/2020   CREATININE 0.79 05/09/2020   BUN 13 05/09/2020   CO2 27 05/09/2020      Assessment & Plan:   1. Mixed hyperlipidemia The current medical regimen is effective;  continue present plan and medications. - Comprehensive metabolic panel - Lipid panel - CBC with Differential/Platelet - TSH  2. Depression, major, recurrent, mild (HCC) The current medical regimen is effective;  continue present plan and medications.  3. Simple chronic bronchitis (HCC) The current medical regimen is effective;  continue present plan and  medications.  4. RLS (restless legs syndrome) The current medical regimen is effective;  continue present plan and medications.  5. OSA on CPAP The current medical regimen is effective;  continue present plan and medications.  6. Other emphysema (HCC)  7. Olecranon bursitis of right elbow Procedure: Risks d/w pt including infection, bleeding, increased pain, and no improvement. Benefits are it may fully resolved.  Area was prepped with alcohol. Lidocaine was used to make a small wheal of medicine.  18 Gauge needle was inserted and approximately 10 cc blood tinged fluid was removed.  Pt tolerated without any complications.  No antibiotics or steroids givne because pt was not having pain, nor did it appear to be infected (no increased warmth or redness.) Wear elbow brace for one week except when showering.  8. Chronic pain syndrome Managed by [facility].  Recommend continue to work on eating healthy diet and exercise.  9. Lumbar back pain: follow up with [facility]  10. Uncomplicated opioid dependence (HCC): follow up with [facility].    Recommend change to calcium carbonate to calcium citrate with D 600 mg one twice a day.  No orders of the defined types were placed in this encounter.   Orders Placed This Encounter  Procedures   Comprehensive metabolic panel   Lipid panel   CBC with Differential/Platelet   TSH    I,Katherina A Bramblett,acting as a scribe for Blane Ohara, MD.,have documented all relevant documentation on the behalf of Blane Ohara, MD,as directed by  Blane Ohara, MD while in the presence of Blane Ohara, MD.  Follow-up: No follow-ups on file.  An After Visit Summary was printed and given to the patient.  Blane Ohara, MD [facility] [phone]

## 2020-08-13 ENCOUNTER — Other Ambulatory Visit: Payer: Self-pay | Admitting: Physician Assistant

## 2020-08-13 ENCOUNTER — Other Ambulatory Visit: Payer: Self-pay

## 2020-08-13 ENCOUNTER — Ambulatory Visit (INDEPENDENT_AMBULATORY_CARE_PROVIDER_SITE_OTHER): Payer: Medicare Other | Admitting: Family Medicine

## 2020-08-13 VITALS — BP 114/60 | HR 76 | Temp 97.7°F | Resp 16 | Ht 64.0 in | Wt 115.0 lb

## 2020-08-13 DIAGNOSIS — F330 Major depressive disorder, recurrent, mild: Secondary | ICD-10-CM

## 2020-08-13 DIAGNOSIS — M545 Low back pain, unspecified: Secondary | ICD-10-CM

## 2020-08-13 DIAGNOSIS — G2581 Restless legs syndrome: Secondary | ICD-10-CM | POA: Diagnosis not present

## 2020-08-13 DIAGNOSIS — M7021 Olecranon bursitis, right elbow: Secondary | ICD-10-CM

## 2020-08-13 DIAGNOSIS — J410 Simple chronic bronchitis: Secondary | ICD-10-CM

## 2020-08-13 DIAGNOSIS — Z9989 Dependence on other enabling machines and devices: Secondary | ICD-10-CM

## 2020-08-13 DIAGNOSIS — G894 Chronic pain syndrome: Secondary | ICD-10-CM

## 2020-08-13 DIAGNOSIS — J438 Other emphysema: Secondary | ICD-10-CM

## 2020-08-13 DIAGNOSIS — G4733 Obstructive sleep apnea (adult) (pediatric): Secondary | ICD-10-CM

## 2020-08-13 DIAGNOSIS — E782 Mixed hyperlipidemia: Secondary | ICD-10-CM | POA: Diagnosis not present

## 2020-08-13 DIAGNOSIS — F1120 Opioid dependence, uncomplicated: Secondary | ICD-10-CM

## 2020-08-13 DIAGNOSIS — F321 Major depressive disorder, single episode, moderate: Secondary | ICD-10-CM

## 2020-08-13 NOTE — Patient Instructions (Addendum)
Recommend change to calcium carbonate to calcium citrate with D 600 mg one twice a day.   Wear elbow brace for one week except when showering.

## 2020-08-14 ENCOUNTER — Other Ambulatory Visit: Payer: Self-pay

## 2020-08-14 ENCOUNTER — Ambulatory Visit (INDEPENDENT_AMBULATORY_CARE_PROVIDER_SITE_OTHER): Payer: Medicare Other | Admitting: Physician Assistant

## 2020-08-14 ENCOUNTER — Encounter: Payer: Self-pay | Admitting: Physician Assistant

## 2020-08-14 VITALS — BP 108/58 | HR 71 | Temp 97.7°F | Ht 64.0 in | Wt 114.0 lb

## 2020-08-14 DIAGNOSIS — M7021 Olecranon bursitis, right elbow: Secondary | ICD-10-CM | POA: Diagnosis not present

## 2020-08-14 LAB — COMPREHENSIVE METABOLIC PANEL
ALT: 19 IU/L (ref 0–32)
AST: 22 IU/L (ref 0–40)
Albumin/Globulin Ratio: 1.7 (ref 1.2–2.2)
Albumin: 4 g/dL (ref 3.7–4.7)
Alkaline Phosphatase: 99 IU/L (ref 44–121)
BUN/Creatinine Ratio: 15 (ref 12–28)
BUN: 13 mg/dL (ref 8–27)
Bilirubin Total: 0.2 mg/dL (ref 0.0–1.2)
CO2: 23 mmol/L (ref 20–29)
Calcium: 8.4 mg/dL — ABNORMAL LOW (ref 8.7–10.3)
Chloride: 100 mmol/L (ref 96–106)
Creatinine, Ser: 0.84 mg/dL (ref 0.57–1.00)
Globulin, Total: 2.4 g/dL (ref 1.5–4.5)
Glucose: 85 mg/dL (ref 65–99)
Potassium: 4.4 mmol/L (ref 3.5–5.2)
Sodium: 138 mmol/L (ref 134–144)
Total Protein: 6.4 g/dL (ref 6.0–8.5)
eGFR: 74 mL/min/{1.73_m2} (ref >59)

## 2020-08-14 LAB — LIPID PANEL
Chol/HDL Ratio: 2 ratio (ref 0.0–4.4)
Cholesterol, Total: 137 mg/dL (ref 100–199)
HDL: 67 mg/dL (ref >39)
LDL Chol Calc (NIH): 56 mg/dL (ref 0–99)
Triglycerides: 70 mg/dL (ref 0–149)
VLDL Cholesterol Cal: 14 mg/dL (ref 5–40)

## 2020-08-14 LAB — CBC WITH DIFFERENTIAL/PLATELET
Basophils Absolute: 0 10*3/uL (ref 0.0–0.2)
Basos: 1 %
EOS (ABSOLUTE): 0.2 10*3/uL (ref 0.0–0.4)
Eos: 2 %
Hematocrit: 40.1 % (ref 34.0–46.6)
Hemoglobin: 13 g/dL (ref 11.1–15.9)
Immature Grans (Abs): 0 10*3/uL (ref 0.0–0.1)
Immature Granulocytes: 0 %
Lymphocytes Absolute: 2.8 10*3/uL (ref 0.7–3.1)
Lymphs: 41 %
MCH: 30.2 pg (ref 26.6–33.0)
MCHC: 32.4 g/dL (ref 31.5–35.7)
MCV: 93 fL (ref 79–97)
Monocytes Absolute: 0.6 10*3/uL (ref 0.1–0.9)
Monocytes: 9 %
Neutrophils Absolute: 3.1 10*3/uL (ref 1.4–7.0)
Neutrophils: 47 %
Platelets: 271 10*3/uL (ref 150–450)
RBC: 4.31 x10E6/uL (ref 3.77–5.28)
RDW: 12.9 % (ref 11.7–15.4)
WBC: 6.7 10*3/uL (ref 3.4–10.8)

## 2020-08-14 LAB — CARDIOVASCULAR RISK ASSESSMENT

## 2020-08-14 LAB — TSH: TSH: 2.52 u[IU]/mL (ref 0.450–4.500)

## 2020-08-14 NOTE — Progress Notes (Signed)
Acute Office Visit  Subjective:    Patient ID: Ellen Holloway, female    DOB: January 29, 1951, 70 y.o.   MRN: [id number]  Chief Complaint  Patient presents with  . Edema    Right arm.    HPI Patient is in today for swelling of her right forearm Yesterday Dr Tobie Poet did aspiration of fluid on her right elbow - no complications --- the elbow was wrapped with an ACE wrap and when patient woke up this morning her forearm was swollen She denies erythema - arm slightly tender No swelling or redness of elbow or above elbow  Past Medical History:  Diagnosis Date  . Acquired absence of both cervix and uterus   . Age-related osteoporosis with current pathological fracture, unspecified site, initial encounter for fracture   . Barrett's esophagus with low grade dysplasia   . Chronic obstructive pulmonary disease (COPD) ([place])   . Major depressive disorder, recurrent severe without psychotic features ([place])   . Mixed hyperlipidemia   . Obstructive sleep apnea (adult) (pediatric)   . Other obesity due to excess calories   . Other pulmonary embolism without acute cor pulmonale ([place])   . Other specified anxiety disorders   . Sleep related leg cramps   . Telogen effluvium     Past Surgical History:  Procedure Laterality Date  . ABDOMINAL HYSTERECTOMY  1980   partial  . right wrist 1985      Family History  Problem Relation Age of Onset  . Obesity Mother   . Heart attack Father   . Renal Disease Brother     Social History   Socioeconomic History  . Marital status: Married    Spouse name: Not on file  . Number of children: Not on file  . Years of education: Not on file  . Highest education level: Not on file  Occupational History  . Not on file  Tobacco Use  . Smoking status: Former Smoker    Packs/day: 0.50    Years: 55.00    Pack years: 27.50    Types: E-cigarettes  . Smokeless tobacco: Never Used  . Tobacco comment: Patient used to smoke up to 2 PPD.  Substance and Sexual  Activity  . Alcohol use: Never  . Drug use: Never  . Sexual activity: Not on file  Other Topics Concern  . Not on file  Social History Narrative  . Not on file   Social Determinants of Health   Financial Resource Strain: Not on file  Food Insecurity: No Food Insecurity  . Worried About Charity fundraiser in the Last Year: Never true  . Ran Out of Food in the Last Year: Never true  Transportation Needs: No Transportation Needs  . Lack of Transportation (Medical): No  . Lack of Transportation (Non-Medical): No  Physical Activity: Insufficiently Active  . Days of Exercise per Week: 3 days  . Minutes of Exercise per Session: 30 min  Stress: Not on file  Social Connections: Not on file  Intimate Partner Violence: Not on file    Outpatient Medications Prior to Visit  Medication Sig Dispense Refill  . albuterol (PROVENTIL) (2.5 MG/3ML) 0.083% nebulizer solution Take 2.5 mg by nebulization every 6 (six) hours as needed for wheezing or shortness of breath.    [name] atorvastatin (LIPITOR) 10 MG tablet TAKE 1 TABLET BY MOUTH EVERY DAY 90 tablet 1  . calcium carbonate (OSCAL) 1500 (600 Ca) MG TABS tablet Take 600 mg of elemental calcium by mouth  daily.    . clonazePAM (KLONOPIN) 1 MG tablet TAKE 1 TABLET BY MOUTH 2 TIMES DAILY AS NEEDED FOR ANXIETY. 30 tablet 1  . cyclobenzaprine (FLEXERIL) 10 MG tablet TAKE 1 TABLET BY MOUTH EVERYDAY AT BEDTIME 30 tablet 2  . famotidine (PEPCID) 40 MG tablet TAKE 1 TABLET BY MOUTH EVERYDAY AT BEDTIME 90 tablet 2  . fexofenadine (ALLEGRA) 180 MG tablet Take 180 mg by mouth in the morning.    . Fluticasone-Umeclidin-Vilant (TRELEGY ELLIPTA) 100-62.5-25 MCG/INH AEPB Inhale 1 puff into the lungs daily as needed.    . furosemide (LASIX) 20 MG tablet TAKE 1 TABLET (20 MG TOTAL) BY MOUTH DAILY AS NEEDED. AS NEEDED FOR SWELLING 90 tablet 0  . montelukast (SINGULAIR) 10 MG tablet TAKE 1 TABLET BY MOUTH EVERYDAY AT BEDTIME 90 tablet 3  . oxyCODONE-acetaminophen  (PERCOCET) 10-325 MG tablet Take 1 tablet by mouth every 6 (six) hours.    . OXYGEN Inhale into the lungs. 2 L at night.    . pantoprazole (PROTONIX) 40 MG tablet TAKE 1 TABLET BY MOUTH TWICE A DAY 180 tablet 3  . pramipexole (MIRAPEX) 1 MG tablet TAKE 1 TABLET BY MOUTH AT BEDTIME. 90 tablet 0  . PROLIA 60 MG/ML SOSY injection Inject into the skin.    . promethazine (PHENERGAN) 25 MG tablet Take 25 mg by mouth every 6 (six) hours as needed for nausea or vomiting.    . sertraline (ZOLOFT) 100 MG tablet TAKE 2 TABLETS BY MOUTH EVERY DAY 180 tablet 0  . spironolactone (ALDACTONE) 25 MG tablet TAKE 1 TABLET BY MOUTH EVERY DAY 90 tablet 2  . VITAMIN D-VITAMIN K PO Take 1 tablet by mouth daily.     No facility-administered medications prior to visit.    Allergies  Allergen Reactions  . Sulfamethoxazole Anaphylaxis  . Sulfa Antibiotics Hives    Review of Systems CONSTITUTIONAL: Negative for chills, fatigue, fever, unintentional weight gain and unintentional weight loss.   CARDIOVASCULAR: Negative for chest pain, dizziness, palpitations and pedal edema.  RESPIRATORY: Negative for recent cough and dyspnea.  MSK: see HPI         Objective:    Physical Exam PHYSICAL EXAM:   VS: BP (!) 108/58   Pulse 71   Temp 97.7 F (36.5 C)   Ht '5\' 4"'  (1.626 m)   Wt 114 lb (51.7 kg)   SpO2 98%   BMI 19.57 kg/m   GEN: Well nourished, well developed, in no acute distress  Cardiac: RRR; no murmurs, rubs, or gallops, Respiratory:  normal respiratory rate and pattern with no distress - normal breath sounds with no rales, rhonchi, wheezes or rubs MS: edema noted on lower forearm --- due to constriction of ACE wrap Skin: warm and dry, no rash no erythema   BP (!) 108/58   Pulse 71   Temp 97.7 F (36.5 C)   Ht '5\' 4"'  (1.626 m)   Wt 114 lb (51.7 kg)   SpO2 98%   BMI 19.57 kg/m  Wt Readings from Last 3 Encounters:  08/14/20 114 lb (51.7 kg)  08/13/20 115 lb (52.2 kg)  05/09/20 113 lb 6.4 oz  (51.4 kg)    Health Maintenance Due  Topic Date Due  . Hepatitis C Screening  Never done  . Zoster Vaccines- Shingrix (1 of 2) Never done  . PNA vac Low Risk Adult (2 of 2 - PCV13) 12/21/2016  . MAMMOGRAM  03/14/2020    There are no preventive care reminders to display  for this patient.   Lab Results  Component Value Date   TSH 2.520 08/13/2020   Lab Results  Component Value Date   WBC 6.7 08/13/2020   HGB 13.0 08/13/2020   HCT 40.1 08/13/2020   MCV 93 08/13/2020   PLT 271 08/13/2020   Lab Results  Component Value Date   NA 138 08/13/2020   K 4.4 08/13/2020   CO2 23 08/13/2020   GLUCOSE 85 08/13/2020   BUN 13 08/13/2020   CREATININE 0.84 08/13/2020   BILITOT 0.2 08/13/2020   ALKPHOS 99 08/13/2020   AST 22 08/13/2020   ALT 19 08/13/2020   PROT 6.4 08/13/2020   ALBUMIN 4.0 08/13/2020   CALCIUM 8.4 (L) 08/13/2020   EGFR 74 08/13/2020   Lab Results  Component Value Date   CHOL 137 08/13/2020   Lab Results  Component Value Date   HDL 67 08/13/2020   Lab Results  Component Value Date   LDLCALC 56 08/13/2020   Lab Results  Component Value Date   TRIG 70 08/13/2020   Lab Results  Component Value Date   CHOLHDL 2.0 08/13/2020   No results found for: HGBA1C     Assessment & Plan:  1. Olecranon bursitis of right elbow  recommend to keep arm elevated and continue care as directed Follow up if symptoms change or worsen  No orders of the defined types were placed in this encounter.   No orders of the defined types were placed in this encounter.     Follow-up: Return if symptoms worsen or fail to improve.  An After Visit Summary was printed and given to the patient.  Yetta Flock [facility] [phone]

## 2020-08-19 DIAGNOSIS — S3210XA Unspecified fracture of sacrum, initial encounter for closed fracture: Secondary | ICD-10-CM | POA: Diagnosis not present

## 2020-08-19 DIAGNOSIS — S22050D Wedge compression fracture of T5-T6 vertebra, subsequent encounter for fracture with routine healing: Secondary | ICD-10-CM | POA: Diagnosis not present

## 2020-08-19 DIAGNOSIS — Z1389 Encounter for screening for other disorder: Secondary | ICD-10-CM | POA: Diagnosis not present

## 2020-08-19 DIAGNOSIS — M47816 Spondylosis without myelopathy or radiculopathy, lumbar region: Secondary | ICD-10-CM | POA: Diagnosis not present

## 2020-08-19 DIAGNOSIS — G894 Chronic pain syndrome: Secondary | ICD-10-CM | POA: Diagnosis not present

## 2020-08-19 DIAGNOSIS — M5416 Radiculopathy, lumbar region: Secondary | ICD-10-CM | POA: Diagnosis not present

## 2020-08-19 DIAGNOSIS — R202 Paresthesia of skin: Secondary | ICD-10-CM | POA: Diagnosis not present

## 2020-08-25 ENCOUNTER — Encounter: Payer: Self-pay | Admitting: Family Medicine

## 2020-09-11 ENCOUNTER — Other Ambulatory Visit: Payer: Self-pay

## 2020-09-11 ENCOUNTER — Encounter: Payer: Self-pay | Admitting: Allergy and Immunology

## 2020-09-11 ENCOUNTER — Ambulatory Visit (INDEPENDENT_AMBULATORY_CARE_PROVIDER_SITE_OTHER): Payer: Medicare Other | Admitting: Allergy and Immunology

## 2020-09-11 DIAGNOSIS — J449 Chronic obstructive pulmonary disease, unspecified: Secondary | ICD-10-CM

## 2020-09-11 DIAGNOSIS — J3089 Other allergic rhinitis: Secondary | ICD-10-CM

## 2020-09-11 DIAGNOSIS — K219 Gastro-esophageal reflux disease without esophagitis: Secondary | ICD-10-CM | POA: Diagnosis not present

## 2020-09-11 MED ORDER — IPRATROPIUM BROMIDE 0.06 % NA SOLN: NASAL | 5 refills | Status: DC

## 2020-09-11 NOTE — Patient Instructions (Addendum)
  1.  Allergen avoidance measures?  2. Blood - Area 2 aeroallergen profile, alpha-1 antitrypsin level and phenotype  3. If needed to dry nose:  A. Ipratropium 0.06% - 2 sprays each nostril every 6 hours  4. Continue treatment for inflammation:  A. Trelegy 100 - 1 inhalation 1 time per day B. Fluticasone - 1 spray each nostril 2 times per day C. Azelastine - 1 spray each nostril 2 times per day D. Montelukast 10 mg - 1 tablet 1 time per day  5. Continue to treat reflux / LPR:   A. Pantoprazole 40 mg - 1 tablet 2 times per day  B. Famotidine 40 mg - 1 tablet 1 time per day  C. Consolidate caffeine and chocolate consumption  6. If needed:  A. Nasal saline B. Fexofenadine 180 - 1 tablet 1 time per day C. Albuterol nebulization every 4-6 hours D. Albuterol HFA - 2 inhalations every 4-6 hours  7. Return to clinic in 4 weeks or earlier if problem

## 2020-09-11 NOTE — Progress Notes (Signed)
[place] - [place] - [place] - [place] - [place]   Dear Dr. Sedalia Muta,  Thank you for referring [name] to the [facility] [place] on 09/11/2020.   Below is a summation of this patient's evaluation and recommendations.  Thank you for your referral. I will keep you informed about this patient's response to treatment.   If you have any questions please do not hesitate to contact me.   Sincerely,  Jessica Priest, MD Allergy / Immunology [facility]   ______________________________________________________________________    NEW PATIENT NOTE  Referring Provider: Blane Ohara, MD Primary Provider: Blane Ohara, MD Date of office visit: 09/11/2020    Subjective:   Chief Complaint:  Ellen Holloway (DOB: August 23, 1950) is a 70 y.o. female who presents to the clinic on 09/11/2020 with a chief complaint of Allergic Rhinitis  and Asthma .     HPI: [name] presents to this clinic in evaluation of respiratory tract problems.  It seems as though she has a long history of COPD followed by local pulmonologist who performed skin tests at one point in time and found her not to be allergic to any specific aeroallergens although her histamine control was extremely small.  She complains of having unrelenting cough.  This is a waxing and waning problem but she has some chronic cough.  Her chronic cough at this point in time is defined by the fact that she coughs so much she almost passes out in association with some gagging and retching but no posttussive vomiting and no posttussive urination.  She feels as though there is "sinus drainage" in her throat with lots of throat clearing.  There is something stuck in the back of her throat.  Is difficult to clear this area.  She will use an albuterol nebulization on a rare occasion.  Her nose is doing relatively well while using the nasal steroid and a nasal  antihistamine but she still has chronic rhinorrhea.  She can smell without any problem.  She does have reflux including Barrett's esophagitis and she has been treated with a proton pump inhibitor and an H2 receptor blocker.  She is followed by Dr. Jennye Boroughs for this issue.  She does drink 1 cup of coffee in the morning and has chocolate on most days.  Past Medical History:  Diagnosis Date   Acquired absence of both cervix and uterus    Age-related osteoporosis with current pathological fracture, unspecified site, initial encounter for fracture    Asthma    Barrett's esophagus with low grade dysplasia    Chronic obstructive pulmonary disease (COPD) (HCC)    Major depressive disorder, recurrent severe without psychotic features (HCC)    Mixed hyperlipidemia    Obstructive sleep apnea (adult) (pediatric)    Other obesity due to excess calories    Other pulmonary embolism without acute cor pulmonale (HCC)    Other specified anxiety disorders    Sleep related leg cramps    Telogen effluvium    Urticaria     Past Surgical History:  Procedure Laterality Date   ABDOMINAL HYSTERECTOMY  1980   partial   right wrist 1985      Allergies as of 09/11/2020       Reactions   Sulfamethoxazole Anaphylaxis   Sulfa Antibiotics Hives        Medication List      albuterol (2.5 MG/3ML) 0.083% nebulizer solution Commonly known as: PROVENTIL  Take 2.5 mg by nebulization every 6 (six) hours as needed for wheezing or shortness of breath.   atorvastatin 10 MG tablet Commonly known as: LIPITOR TAKE 1 TABLET BY MOUTH EVERY DAY   azelastine 0.1 % nasal spray Commonly known as: ASTELIN Place 1 spray into both nostrils daily. Use in each nostril as directed   calcium carbonate 1500 (600 Ca) MG Tabs tablet Commonly known as: OSCAL Take 600 mg of elemental calcium by mouth daily.   clonazePAM 1 MG tablet Commonly known as: KLONOPIN TAKE 1 TABLET BY MOUTH 2 TIMES DAILY AS NEEDED FOR  ANXIETY.   cyclobenzaprine 10 MG tablet Commonly known as: FLEXERIL TAKE 1 TABLET BY MOUTH EVERYDAY AT BEDTIME   famotidine 40 MG tablet Commonly known as: PEPCID TAKE 1 TABLET BY MOUTH EVERYDAY AT BEDTIME   fexofenadine 180 MG tablet Commonly known as: ALLEGRA Take 180 mg by mouth in the morning.   fluticasone 50 MCG/ACT nasal spray Commonly known as: FLONASE Place 1 spray into both nostrils as needed for allergies or rhinitis.   furosemide 20 MG tablet Commonly known as: LASIX TAKE 1 TABLET (20 MG TOTAL) BY MOUTH DAILY AS NEEDED. AS NEEDED FOR SWELLING   montelukast 10 MG tablet Commonly known as: SINGULAIR TAKE 1 TABLET BY MOUTH EVERYDAY AT BEDTIME   oxyCODONE-acetaminophen 10-325 MG tablet Commonly known as: PERCOCET Take 1 tablet by mouth every 6 (six) hours.   OXYGEN Inhale into the lungs. 2 L at night.   pantoprazole 40 MG tablet Commonly known as: PROTONIX TAKE 1 TABLET BY MOUTH TWICE A DAY   promethazine 25 MG tablet Commonly known as: PHENERGAN Take 25 mg by mouth every 6 (six) hours as needed for nausea or vomiting.   sertraline 100 MG tablet Commonly known as: ZOLOFT TAKE 2 TABLETS BY MOUTH EVERY DAY   spironolactone 25 MG tablet Commonly known as: ALDACTONE TAKE 1 TABLET BY MOUTH EVERY DAY   Trelegy Ellipta 100-62.5-25 MCG/INH Aepb Generic drug: Fluticasone-Umeclidin-Vilant Inhale 1 puff into the lungs daily as needed.   VITAMIN D-VITAMIN K PO Take 1 tablet by mouth daily.        Review of systems negative except as noted in HPI / PMHx or noted below:  Review of Systems  Constitutional: Negative.   HENT: Negative.    Eyes: Negative.   Respiratory: Negative.    Cardiovascular: Negative.   Gastrointestinal: Negative.   Genitourinary: Negative.   Musculoskeletal: Negative.   Skin: Negative.   Neurological: Negative.   Endo/Heme/Allergies: Negative.   Psychiatric/Behavioral: Negative.     Family History  Problem Relation Age of  Onset   Obesity Mother    Heart attack Father    Allergic rhinitis Sister    Asthma Brother    Renal Disease Brother     Social History   Socioeconomic History   Marital status: Married    Spouse name: Not on file   Number of children: Not on file   Years of education: Not on file   Highest education level: Not on file  Occupational History   Not on file  Tobacco Use   Smoking status: Former    Packs/day: 0.50    Years: 55.00    Pack years: 27.50    Types: E-cigarettes, Cigarettes   Smokeless tobacco: Never   Tobacco comments:    Patient used to smoke up to 2 PPD.  Substance and Sexual Activity   Alcohol use: Never   Drug use: Never   Sexual activity:  Not on file  Other Topics Concern   Not on file  Social History Narrative   Not on file    Environmental and Social history  Lives in a house with a dry environment, cats and dogs look inside the household, no carpet in the bedroom, no plastic on the bed, no plastic on the pillow, and no smoking ongoing with inside the household.  She is a former smoker from [phone] at approximately 1/2 pack/day.  Objective:   Vitals:   09/11/20 0928  BP: 108/62  Pulse: 84  Resp: 16  SpO2: 97%   Height: 5\' 4"  (162.6 cm) Weight: 113 lb 6.4 oz (51.4 kg)  Physical Exam Constitutional:      Appearance: She is not diaphoretic.     Comments: Intermittent cough  HENT:     Head: Normocephalic. No right periorbital erythema or left periorbital erythema.     Right Ear: Tympanic membrane, ear canal and external ear normal.     Left Ear: Tympanic membrane, ear canal and external ear normal.     Nose: Nose normal. No mucosal edema or rhinorrhea.     Mouth/Throat:     Pharynx: Uvula midline. No oropharyngeal exudate.  Eyes:     General: Lids are normal.     Conjunctiva/sclera: Conjunctivae normal.     Pupils: Pupils are equal, round, and reactive to light.  Neck:     Thyroid: No thyromegaly.     Trachea: Trachea normal. No  tracheal tenderness or tracheal deviation.  Cardiovascular:     Rate and Rhythm: Normal rate and regular rhythm.     Heart sounds: Normal heart sounds, S1 normal and S2 normal. No murmur heard. Pulmonary:     Effort: Pulmonary effort is normal. No respiratory distress.     Breath sounds: Normal breath sounds. No stridor. No wheezing or rales.  Chest:     Chest wall: No tenderness.  Abdominal:     General: There is no distension.     Palpations: Abdomen is soft. There is no mass.     Tenderness: There is no abdominal tenderness. There is no guarding or rebound.  Musculoskeletal:        General: No tenderness.     Comments: Kyphosis  Lymphadenopathy:     Head:     Right side of head: No tonsillar adenopathy.     Left side of head: No tonsillar adenopathy.     Cervical: No cervical adenopathy.  Skin:    Coloration: Skin is not pale.     Findings: No erythema or rash.     Nails: There is no clubbing.  Neurological:     Mental Status: She is alert.    Diagnostics: Allergy skin tests were not performed.   Spirometry was performed and demonstrated an FEV1 of 1.19 @ 55 % of predicted. FEV1/FVC = 0.56.  Following administration of nebulized albuterol her FEV1 increased to 1.45 which was a calculated increase of 22%.  Results of blood tests obtained 13 Aug 2020 identifies WBC 6.7, absolute eosinophil 200, absolute lymphocyte 2800, hemoglobin 13.0, platelet 271.  Results of a chest CT scan obtained 12 May 2019 identifies small pulmonary nodules noted in the lungs bilaterally largest of which is in the medial aspect of the right lower lobe near the base with a mean diameter 7.8 mm.  No pleural effusions.  Diffuse bronchial wall thickening with centrilobular and paraseptal emphysema, no mediastinal abnormality.   Assessment and Plan:    1. COPD with  asthma (HCC)   2. Perennial allergic rhinitis   3. LPRD (laryngopharyngeal reflux disease)     1.  Allergen avoidance  measures?  2. Blood - Area 2 aeroallergen profile, alpha-1 antitrypsin level and phenotype  3. If needed to dry nose:  A. Ipratropium 0.06% - 2 sprays each nostril every 6 hours  4. Continue treatment for inflammation:  A. Trelegy 100 - 1 inhalation 1 time per day B. Fluticasone - 1 spray each nostril 2 times per day C. Azelastine - 1 spray each nostril 2 times per day D. Montelukast 10 mg - 1 tablet 1 time per day  5. Continue to treat reflux / LPR:   A. Pantoprazole 40 mg - 1 tablet 2 times per day  B. Famotidine 40 mg - 1 tablet 1 time per day  C. Consolidate caffeine and chocolate consumption  6. If needed:  A. Nasal saline B. Fexofenadine 180 - 1 tablet 1 time per day C. Albuterol nebulization every 4-6 hours D. Albuterol HFA - 2 inhalations every 4-6 hours  7. Return to clinic in 4 weeks or earlier if problem  Cera has an inflamed and irritated respiratory tract most likely from possible atopic triggers, reflux triggers, significant architectural changes from tobacco smoke exposure in the past, for which we will have her utilize a combination of anti-inflammatory agents for her airway including the use of nasal ipratropium to address her chronic rhinorrhea which is one of her major complaints.  As well, she will aggressively treat her reflux as noted above.  Will exclude alpha-1 antitrypsin deficiency giving rise to some of her issue and investigate her possible atopic disease with the blood test noted above.  I will see her back in this clinic in 4 weeks or earlier if there is a problem.  Jessica Priest, MD Allergy / Immunology [facility] [place]

## 2020-09-12 ENCOUNTER — Encounter: Payer: Self-pay | Admitting: Allergy and Immunology

## 2020-09-12 DIAGNOSIS — J449 Chronic obstructive pulmonary disease, unspecified: Secondary | ICD-10-CM | POA: Diagnosis not present

## 2020-09-12 DIAGNOSIS — J3089 Other allergic rhinitis: Secondary | ICD-10-CM | POA: Diagnosis not present

## 2020-09-13 ENCOUNTER — Telehealth: Payer: Self-pay

## 2020-09-13 NOTE — Progress Notes (Signed)
Chronic Care Management Pharmacy Assistant   Name: Ellen Holloway  MRN: [id number] DOB: Apr 30, 1950  Reason for Encounter: Disease State for COPD   Recent office visits:  08/14/20-Sara Dimas [place]as (PCP)- bursitis of right elbow, no medication changes, return PRN.  Recent consult visits:  09/11/20-Dr Kozlow, Asthma and Allergy, Patient reported not taking Prolia or Mirapex, started Atrovent nasal spray.   Hospital visits:  None in previous 6 months  Medications: Outpatient Encounter Medications as of 09/13/2020  Medication Sig   albuterol (PROVENTIL) (2.5 MG/3ML) 0.083% nebulizer solution Take 2.5 mg by nebulization every 6 (six) hours as needed for wheezing or shortness of breath.   atorvastatin (LIPITOR) 10 MG tablet TAKE 1 TABLET BY MOUTH EVERY DAY   azelastine (ASTELIN) 0.1 % nasal spray Place 1 spray into both nostrils daily. Use in each nostril as directed   calcium carbonate (OSCAL) 1500 (600 Ca) MG TABS tablet Take 600 mg of elemental calcium by mouth daily.   clonazePAM (KLONOPIN) 1 MG tablet TAKE 1 TABLET BY MOUTH 2 TIMES DAILY AS NEEDED FOR ANXIETY.   cyclobenzaprine (FLEXERIL) 10 MG tablet TAKE 1 TABLET BY MOUTH EVERYDAY AT BEDTIME   famotidine (PEPCID) 40 MG tablet TAKE 1 TABLET BY MOUTH EVERYDAY AT BEDTIME   fexofenadine (ALLEGRA) 180 MG tablet Take 180 mg by mouth in the morning.   fluticasone (FLONASE) 50 MCG/ACT nasal spray Place 1 spray into both nostrils as needed for allergies or rhinitis.   Fluticasone-Umeclidin-Vilant (TRELEGY ELLIPTA) 100-62.5-25 MCG/INH AEPB Inhale 1 puff into the lungs daily as needed.   furosemide (LASIX) 20 MG tablet TAKE 1 TABLET (20 MG TOTAL) BY MOUTH DAILY AS NEEDED. AS NEEDED FOR SWELLING   ipratropium (ATROVENT) 0.06 % nasal spray 2 sprays each nostril every 6 hours   montelukast (SINGULAIR) 10 MG tablet TAKE 1 TABLET BY MOUTH EVERYDAY AT BEDTIME   oxyCODONE-acetaminophen (PERCOCET) 10-325 MG tablet Take 1 tablet by mouth every 6 (six)  hours.   OXYGEN Inhale into the lungs. 2 L at night.   pantoprazole (PROTONIX) 40 MG tablet TAKE 1 TABLET BY MOUTH TWICE A DAY   promethazine (PHENERGAN) 25 MG tablet Take 25 mg by mouth every 6 (six) hours as needed for nausea or vomiting. (Patient not taking: Reported on 09/11/2020)   sertraline (ZOLOFT) 100 MG tablet TAKE 2 TABLETS BY MOUTH EVERY DAY   spironolactone (ALDACTONE) 25 MG tablet TAKE 1 TABLET BY MOUTH EVERY DAY   VITAMIN D-VITAMIN K PO Take 1 tablet by mouth daily.   No facility-administered encounter medications on file as of 09/13/2020.    Current COPD regimen:  Trelegy 100 - 1 inhalation 1 time per day Fluticasone - 1 spray each nostril 2 times per day Azelastine - 1 spray each nostril 2 times per day Montelukast 10 mg - 1 tablet 1 time per day  No flowsheet data found.  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  She stated she uses medications as needed.  She said she is feeling great.    What recent interventions/DTPs have been made by any provider to improve breathing since last visit: Patient stated she takes medication as needed, she is doing wonderful, she is working on rental house to get ready to sell it.    Have you had exacerbation/flare-up since last visit? No  What do you do when you are short of  breath?  Rest  Current tobacco use? No  Respiratory Devices/Equipment Do you have a nebulizer? Yes Do you use a Peak Flow Meter? No Do you use a maintenance inhaler? Yes How often do you forget to use your daily inhaler? Never Do you use a rescue inhaler? Yes How often do you use your rescue inhaler?  prn Do you use a spacer with your inhaler? No  Adherence Review: Does the patient have >5 day gap between last estimated fill date for maintenance inhaler medications? CPP to  review  Medications Atorvastatin  07/28/20 90 Furosemide  07/09/20 90  Care Gaps: Last annual wellness visit? 08/30/19  Leilani Able, CMA Clinical Pharmacist Assistant [phone]

## 2020-09-15 ENCOUNTER — Other Ambulatory Visit: Payer: Self-pay | Admitting: Physician Assistant

## 2020-09-15 DIAGNOSIS — F321 Major depressive disorder, single episode, moderate: Secondary | ICD-10-CM

## 2020-09-17 DIAGNOSIS — S22050D Wedge compression fracture of T5-T6 vertebra, subsequent encounter for fracture with routine healing: Secondary | ICD-10-CM | POA: Diagnosis not present

## 2020-09-17 DIAGNOSIS — G894 Chronic pain syndrome: Secondary | ICD-10-CM | POA: Diagnosis not present

## 2020-09-17 DIAGNOSIS — M5416 Radiculopathy, lumbar region: Secondary | ICD-10-CM | POA: Diagnosis not present

## 2020-09-17 DIAGNOSIS — Z1389 Encounter for screening for other disorder: Secondary | ICD-10-CM | POA: Diagnosis not present

## 2020-09-17 DIAGNOSIS — S3210XA Unspecified fracture of sacrum, initial encounter for closed fracture: Secondary | ICD-10-CM | POA: Diagnosis not present

## 2020-09-17 DIAGNOSIS — M47816 Spondylosis without myelopathy or radiculopathy, lumbar region: Secondary | ICD-10-CM | POA: Diagnosis not present

## 2020-09-17 DIAGNOSIS — R202 Paresthesia of skin: Secondary | ICD-10-CM | POA: Diagnosis not present

## 2020-09-17 LAB — ALLERGENS W/TOTAL IGE AREA 2

## 2020-09-17 LAB — ALPHA-1-ANTITRYPSIN: A-1 Antitrypsin: 147 mg/dL (ref 101–187)

## 2020-10-05 ENCOUNTER — Other Ambulatory Visit: Payer: Self-pay | Admitting: Physician Assistant

## 2020-10-05 ENCOUNTER — Other Ambulatory Visit: Payer: Self-pay | Admitting: Family Medicine

## 2020-10-10 ENCOUNTER — Other Ambulatory Visit: Payer: Self-pay | Admitting: Family Medicine

## 2020-10-16 ENCOUNTER — Other Ambulatory Visit: Payer: Self-pay

## 2020-10-16 ENCOUNTER — Encounter: Payer: Self-pay | Admitting: Allergy and Immunology

## 2020-10-16 ENCOUNTER — Ambulatory Visit (INDEPENDENT_AMBULATORY_CARE_PROVIDER_SITE_OTHER): Payer: Medicare Other | Admitting: Allergy and Immunology

## 2020-10-16 VITALS — BP 102/70 | HR 80 | Resp 14

## 2020-10-16 DIAGNOSIS — J449 Chronic obstructive pulmonary disease, unspecified: Secondary | ICD-10-CM

## 2020-10-16 DIAGNOSIS — K219 Gastro-esophageal reflux disease without esophagitis: Secondary | ICD-10-CM | POA: Diagnosis not present

## 2020-10-16 DIAGNOSIS — J3089 Other allergic rhinitis: Secondary | ICD-10-CM | POA: Diagnosis not present

## 2020-10-16 NOTE — Progress Notes (Signed)
- [place] [facility] [place] - [place] [facility] [place]   Follow-up Note  Referring Provider: Blane Ohara, MD Primary Provider: Blane Ohara, MD Date of Office Visit: 10/16/2020  Subjective:   Ellen Holloway (DOB: 1950-05-25) is a 70 y.o. female who [facility] in re-evaluation of the following:  HPI: Anyssa returns to this clinic in evaluation of COPD with asthma, allergic rhinitis, and LPR.  Her last visit to this clinic was 11 September 2020 which was her initial evaluation.  She is significantly improved since her last visit and has resolved her chronic rhinorrhea, chronic postnasal drip, and chronic cough.  It appears that the one manipulation that gave rise to all of this improvement is the introduction of nasal ipratropium.    And, she is careful about addressing the issue of LPR by decreasing her caffeine consumption and eliminating most chocolate consumption while she aggressively treats this reflux issue.  She does not believe that she needs to use any other nasal spray other than nasal ipratropium and she was never impressed that montelukast gave her any improvement whatsoever.  She is interested in discontinuing her 2 additional nasal sprays and her montelukast.  She is able to exert herself with no problem at all and does not really get very dyspneic and she has no coughing and she does not use a short acting bronchodilator.  Allergies as of 10/16/2020       Reactions   Sulfamethoxazole Anaphylaxis   Sulfa Antibiotics Hives        Medication List    albuterol (2.5 MG/3ML) 0.083% nebulizer solution Commonly known as: PROVENTIL Take 2.5 mg by nebulization every 6 (six) hours as needed for wheezing or shortness of breath.   atorvastatin 10 MG tablet Commonly known as: LIPITOR TAKE 1 TABLET BY MOUTH EVERY DAY   azelastine 0.1 % nasal spray Commonly known as: ASTELIN Place 1 spray into both nostrils daily. Use in each  nostril as directed   calcium carbonate 1500 (600 Ca) MG Tabs tablet Commonly known as: OSCAL Take 600 mg of elemental calcium by mouth daily.   clonazePAM 1 MG tablet Commonly known as: KLONOPIN TAKE 1 TABLET BY MOUTH 2 TIMES DAILY AS NEEDED FOR ANXIETY.   cyclobenzaprine 10 MG tablet Commonly known as: FLEXERIL TAKE 1 TABLET BY MOUTH EVERYDAY AT BEDTIME   famotidine 40 MG tablet Commonly known as: PEPCID TAKE 1 TABLET BY MOUTH EVERYDAY AT BEDTIME   fexofenadine 180 MG tablet Commonly known as: ALLEGRA Take 180 mg by mouth in the morning.   fluticasone 50 MCG/ACT nasal spray Commonly known as: FLONASE Place 1 spray into both nostrils as needed for allergies or rhinitis.   furosemide 20 MG tablet Commonly known as: LASIX TAKE 1 TABLET (20 MG TOTAL) BY MOUTH DAILY AS NEEDED. AS NEEDED FOR SWELLING   ipratropium 0.06 % nasal spray Commonly known as: ATROVENT 2 sprays each nostril every 6 hours   montelukast 10 MG tablet Commonly known as: SINGULAIR TAKE 1 TABLET BY MOUTH EVERYDAY AT BEDTIME   oxyCODONE-acetaminophen 10-325 MG tablet Commonly known as: PERCOCET Take 1 tablet by mouth every 6 (six) hours.   OXYGEN Inhale into the lungs. 2 L at night.   pantoprazole 40 MG tablet Commonly known as: PROTONIX TAKE 1 TABLET BY MOUTH TWICE A DAY   promethazine 25 MG tablet Commonly known as: PHENERGAN Take 25 mg by mouth every 6 (six) hours as needed for nausea or vomiting.  sertraline 100 MG tablet Commonly known as: ZOLOFT TAKE 2 TABLETS BY MOUTH EVERY DAY   spironolactone 25 MG tablet Commonly known as: ALDACTONE TAKE 1 TABLET BY MOUTH EVERY DAY   Trelegy Ellipta 100-62.5-25 MCG/INH Aepb Generic drug: Fluticasone-Umeclidin-Vilant Inhale 1 puff into the lungs daily as needed.   VITAMIN D-VITAMIN K PO Take 1 tablet by mouth daily.        Past Medical History:  Diagnosis Date   Acquired absence of both cervix and uterus    Age-related osteoporosis  with current pathological fracture, unspecified site, initial encounter for fracture    Asthma    Barrett's esophagus with low grade dysplasia    Chronic obstructive pulmonary disease (COPD) (HCC)    Major depressive disorder, recurrent severe without psychotic features (HCC)    Mixed hyperlipidemia    Obstructive sleep apnea (adult) (pediatric)    Other obesity due to excess calories    Other pulmonary embolism without acute cor pulmonale (HCC)    Other specified anxiety disorders    Sleep related leg cramps    Telogen effluvium    Urticaria     Past Surgical History:  Procedure Laterality Date   ABDOMINAL HYSTERECTOMY  1980   partial   right wrist 1985      Review of systems negative except as noted in HPI / PMHx or noted below:  Review of Systems  Constitutional: Negative.   HENT: Negative.    Eyes: Negative.   Respiratory: Negative.    Cardiovascular: Negative.   Gastrointestinal: Negative.   Genitourinary: Negative.   Musculoskeletal: Negative.   Skin: Negative.   Neurological: Negative.   Endo/Heme/Allergies: Negative.   Psychiatric/Behavioral: Negative.      Objective:   Vitals:   10/16/20 0852  BP: 102/70  Pulse: 80  Resp: 14  SpO2: 94%          Physical Exam Constitutional:      Appearance: She is not diaphoretic.  HENT:     Head: Normocephalic.     Right Ear: Tympanic membrane, ear canal and external ear normal.     Left Ear: Tympanic membrane, ear canal and external ear normal.     Nose: Nose normal. No mucosal edema or rhinorrhea.     Mouth/Throat:     Pharynx: Uvula midline. No oropharyngeal exudate.  Eyes:     Conjunctiva/sclera: Conjunctivae normal.  Neck:     Thyroid: No thyromegaly.     Trachea: Trachea normal. No tracheal tenderness or tracheal deviation.  Cardiovascular:     Rate and Rhythm: Normal rate and regular rhythm.     Heart sounds: Normal heart sounds, S1 normal and S2 normal. No murmur heard. Pulmonary:     Effort: No  respiratory distress.     Breath sounds: Normal breath sounds. No stridor. No wheezing or rales.  Lymphadenopathy:     Head:     Right side of head: No tonsillar adenopathy.     Left side of head: No tonsillar adenopathy.     Cervical: No cervical adenopathy.  Skin:    Findings: No erythema or rash.     Nails: There is no clubbing.  Neurological:     Mental Status: She is alert.    Diagnostics:    Spirometry was performed and demonstrated an FEV1 of 1.56 at 72 % of predicted.  Results of blood tests obtained 13 Aug 2020 identified WBC 6.7, absolute eosinophil 200, absolute lymphocyte 2800, hemoglobin 13.0, platelet 271, TSH 2.520 IU/mL.  Results  of blood tests obtained 12 September 2020 identified serum IgE 54 KU/L, no antigen specific IgE antibodies identified on an area two aero allergen profile, alpha-1 antitrypsin 147 mg/DL with phenotype not process secondary to laboratory reagent deficit.  Assessment and Plan:   1. COPD with asthma (HCC)   2. Perennial allergic rhinitis   3. LPRD (laryngopharyngeal reflux disease)     1. Continue the following to dry nose / post nasal drip:  A. Ipratropium 0.06% - 2 sprays each nostril every 6 hours  2. Continue treatment for inflammation:  A. Trelegy 100 - 1 inhalation 1 time per day  3. Continue to treat reflux / LPR:   A. Pantoprazole 40 mg - 1 tablet 2 times per day  B. Famotidine 40 mg - 1 tablet 1 time per day  C. Consolidate caffeine and chocolate consumption  4. Can attempt to discontinue the following medications:  A. Fluticasone - 1 spray each nostril 2 times per day B. Azelastine - 1 spray each nostril 2 times per day C. Montelukast 10 mg - 1 tablet 1 time per day  5. If needed:  A. Nasal saline B. Fexofenadine 180 - 1 tablet 1 time per day C. Albuterol nebulization every 4-6 hours D. Albuterol HFA - 2 inhalations every 4-6 hours  6. Obtain fall flu vaccine  7. Return to clinic in 6 months or earlier if  problem  Amauria appears to be doing relatively well and she has had some significant improvement regarding her chronic respiratory tract symptoms while utilizing therapy directed against respiratory tract inflammation and autonomic dysfunction of her upper airway leading to copious amounts of nasal and postnasal secretions and her reflux induced respiratory disease on her current therapy.  There does appear to be a opportunity to consolidate some of her treatment and we will have her eliminate her nasal steroid and nasal antihistamine and her leukotriene modifier and see what happens as we move forward over the course of the next several months.  I will see her back in his clinic in 6 months or earlier if there is a problem.  Laurette Schimke, MD Allergy / Immunology [facility]

## 2020-10-16 NOTE — Patient Instructions (Addendum)
  1. Continue the following to dry nose / post nasal drip:  A. Ipratropium 0.06% - 2 sprays each nostril every 6 hours  2. Continue treatment for inflammation:  A. Trelegy 100 - 1 inhalation 1 time per day  3. Continue to treat reflux / LPR:   A. Pantoprazole 40 mg - 1 tablet 2 times per day  B. Famotidine 40 mg - 1 tablet 1 time per day  C. Consolidate caffeine and chocolate consumption  4. Can attempt to discontinue the following medications:  A. Fluticasone - 1 spray each nostril 2 times per day B. Azelastine - 1 spray each nostril 2 times per day C. Montelukast 10 mg - 1 tablet 1 time per day  5. If needed:  A. Nasal saline B. Fexofenadine 180 - 1 tablet 1 time per day C. Albuterol nebulization every 4-6 hours D. Albuterol HFA - 2 inhalations every 4-6 hours  6. Obtain fall flu vaccine  7. Return to clinic in 6 months or earlier if problem

## 2020-10-17 ENCOUNTER — Encounter: Payer: Self-pay | Admitting: Allergy and Immunology

## 2020-10-18 DIAGNOSIS — S3210XA Unspecified fracture of sacrum, initial encounter for closed fracture: Secondary | ICD-10-CM | POA: Diagnosis not present

## 2020-10-18 DIAGNOSIS — R202 Paresthesia of skin: Secondary | ICD-10-CM | POA: Diagnosis not present

## 2020-10-18 DIAGNOSIS — M5416 Radiculopathy, lumbar region: Secondary | ICD-10-CM | POA: Diagnosis not present

## 2020-10-18 DIAGNOSIS — Z1389 Encounter for screening for other disorder: Secondary | ICD-10-CM | POA: Diagnosis not present

## 2020-10-18 DIAGNOSIS — M47816 Spondylosis without myelopathy or radiculopathy, lumbar region: Secondary | ICD-10-CM | POA: Diagnosis not present

## 2020-10-18 DIAGNOSIS — S22050D Wedge compression fracture of T5-T6 vertebra, subsequent encounter for fracture with routine healing: Secondary | ICD-10-CM | POA: Diagnosis not present

## 2020-10-18 DIAGNOSIS — G894 Chronic pain syndrome: Secondary | ICD-10-CM | POA: Diagnosis not present

## 2020-10-22 DIAGNOSIS — G4733 Obstructive sleep apnea (adult) (pediatric): Secondary | ICD-10-CM | POA: Diagnosis not present

## 2020-10-22 DIAGNOSIS — J301 Allergic rhinitis due to pollen: Secondary | ICD-10-CM | POA: Diagnosis not present

## 2020-10-22 DIAGNOSIS — J4530 Mild persistent asthma, uncomplicated: Secondary | ICD-10-CM | POA: Diagnosis not present

## 2020-10-22 DIAGNOSIS — Z711 Person with feared health complaint in whom no diagnosis is made: Secondary | ICD-10-CM | POA: Diagnosis not present

## 2020-10-22 DIAGNOSIS — Z87891 Personal history of nicotine dependence: Secondary | ICD-10-CM | POA: Diagnosis not present

## 2020-10-28 ENCOUNTER — Other Ambulatory Visit: Payer: Self-pay | Admitting: Family Medicine

## 2020-10-28 NOTE — Telephone Encounter (Signed)
Pt calling states she is out of medication.   Lorita Officer, [place] 10/28/20 2:13 PM

## 2020-11-15 DIAGNOSIS — S22050D Wedge compression fracture of T5-T6 vertebra, subsequent encounter for fracture with routine healing: Secondary | ICD-10-CM | POA: Diagnosis not present

## 2020-11-15 DIAGNOSIS — Z1389 Encounter for screening for other disorder: Secondary | ICD-10-CM | POA: Diagnosis not present

## 2020-11-15 DIAGNOSIS — G894 Chronic pain syndrome: Secondary | ICD-10-CM | POA: Diagnosis not present

## 2020-11-15 DIAGNOSIS — R202 Paresthesia of skin: Secondary | ICD-10-CM | POA: Diagnosis not present

## 2020-11-15 DIAGNOSIS — S3210XA Unspecified fracture of sacrum, initial encounter for closed fracture: Secondary | ICD-10-CM | POA: Diagnosis not present

## 2020-11-15 DIAGNOSIS — M5416 Radiculopathy, lumbar region: Secondary | ICD-10-CM | POA: Diagnosis not present

## 2020-11-15 DIAGNOSIS — M47816 Spondylosis without myelopathy or radiculopathy, lumbar region: Secondary | ICD-10-CM | POA: Diagnosis not present

## 2020-11-17 ENCOUNTER — Other Ambulatory Visit: Payer: Self-pay | Admitting: Family Medicine

## 2020-11-19 ENCOUNTER — Encounter: Payer: Self-pay | Admitting: Family Medicine

## 2020-11-19 ENCOUNTER — Other Ambulatory Visit: Payer: Self-pay

## 2020-11-19 ENCOUNTER — Ambulatory Visit (INDEPENDENT_AMBULATORY_CARE_PROVIDER_SITE_OTHER): Payer: Medicare Other | Admitting: Family Medicine

## 2020-11-19 VITALS — BP 122/62 | HR 86 | Temp 97.3°F | Ht 64.0 in | Wt 113.0 lb

## 2020-11-19 DIAGNOSIS — G4733 Obstructive sleep apnea (adult) (pediatric): Secondary | ICD-10-CM

## 2020-11-19 DIAGNOSIS — F330 Major depressive disorder, recurrent, mild: Secondary | ICD-10-CM | POA: Diagnosis not present

## 2020-11-19 DIAGNOSIS — E782 Mixed hyperlipidemia: Secondary | ICD-10-CM

## 2020-11-19 DIAGNOSIS — J9611 Chronic respiratory failure with hypoxia: Secondary | ICD-10-CM | POA: Diagnosis not present

## 2020-11-19 DIAGNOSIS — Z1231 Encounter for screening mammogram for malignant neoplasm of breast: Secondary | ICD-10-CM | POA: Diagnosis not present

## 2020-11-19 DIAGNOSIS — Z23 Encounter for immunization: Secondary | ICD-10-CM | POA: Diagnosis not present

## 2020-11-19 DIAGNOSIS — Z9989 Dependence on other enabling machines and devices: Secondary | ICD-10-CM

## 2020-11-19 DIAGNOSIS — G2581 Restless legs syndrome: Secondary | ICD-10-CM

## 2020-11-19 DIAGNOSIS — J438 Other emphysema: Secondary | ICD-10-CM | POA: Diagnosis not present

## 2020-11-19 DIAGNOSIS — M25541 Pain in joints of right hand: Secondary | ICD-10-CM

## 2020-11-19 DIAGNOSIS — G894 Chronic pain syndrome: Secondary | ICD-10-CM | POA: Insufficient documentation

## 2020-11-19 DIAGNOSIS — M25542 Pain in joints of left hand: Secondary | ICD-10-CM | POA: Diagnosis not present

## 2020-11-19 MED ORDER — PROMETHAZINE HCL 25 MG PO TABS: 25.0000 mg | ORAL_TABLET | Freq: Four times a day (QID) | ORAL | 1 refills | Status: DC | PRN

## 2020-11-19 NOTE — Progress Notes (Signed)
Subjective:  Patient ID: Ellen Holloway, female    DOB: 10-25-1950  Age: 70 y.o. MRN: [id number]  Chief Complaint  Patient presents with   Hyperlipidemia    HPI Patient is a 70 year old female who presents for follow-up of COPD, hyperlipidemia, reflux, and depression. Her COPD is well controlled on Trelegy 1 elation daily.  She also has albuterol as needed. Pt is on oxygen 2 L at night.  Hyperlipidemia: Currently on Lipitor 10 mg once daily at night.  Patient is eating healthy.  She exercises while she is remodeling her home. GERD: On Pepcid 40 mg daily. Depression: Mild recurrent.  Patient is doing great on clonazepam 1 mg twice daily as needed for anxiety and on Zoloft 100 mg 2 daily.   PHQ9 SCORE ONLY 11/19/2020 08/13/2020 08/13/2020  PHQ-9 Total Score 2 2 0   Her only complaint today is hand stiffness and pain over the last month.   Current Outpatient Medications on File Prior to Visit  Medication Sig Dispense Refill   albuterol (PROVENTIL) (2.5 MG/3ML) 0.083% nebulizer solution Take 2.5 mg by nebulization every 6 (six) hours as needed for wheezing or shortness of breath.     atorvastatin (LIPITOR) 10 MG tablet TAKE 1 TABLET BY MOUTH EVERY DAY 90 tablet 1   calcium carbonate (OSCAL) 1500 (600 Ca) MG TABS tablet Take 600 mg of elemental calcium by mouth daily.     clonazePAM (KLONOPIN) 1 MG tablet TAKE 1 TABLET BY MOUTH 2 TIMES DAILY AS NEEDED FOR ANXIETY. 30 tablet 1   cyclobenzaprine (FLEXERIL) 10 MG tablet TAKE 1 TABLET BY MOUTH EVERYDAY AT BEDTIME 30 tablet 2   famotidine (PEPCID) 40 MG tablet TAKE 1 TABLET BY MOUTH EVERYDAY AT BEDTIME 90 tablet 2   Fluticasone-Umeclidin-Vilant (TRELEGY ELLIPTA) 100-62.5-25 MCG/INH AEPB Inhale 1 puff into the lungs daily as needed.     furosemide (LASIX) 20 MG tablet TAKE 1 TABLET (20 MG TOTAL) BY MOUTH DAILY AS NEEDED. AS NEEDED FOR SWELLING 90 tablet 0   ipratropium (ATROVENT) 0.06 % nasal spray 2 sprays each nostril every 6 hours 15 mL 5    oxyCODONE-acetaminophen (PERCOCET) 10-325 MG tablet Take 1 tablet by mouth every 6 (six) hours.     OXYGEN Inhale into the lungs. 2 L at night.     pantoprazole (PROTONIX) 40 MG tablet TAKE 1 TABLET BY MOUTH TWICE A DAY 180 tablet 3   pramipexole (MIRAPEX) 1 MG tablet TAKE 1 TABLET BY MOUTH EVERYDAY AT BEDTIME 90 tablet 0   sertraline (ZOLOFT) 100 MG tablet TAKE 2 TABLETS BY MOUTH EVERY DAY 180 tablet 0   spironolactone (ALDACTONE) 25 MG tablet TAKE 1 TABLET BY MOUTH EVERY DAY 90 tablet 2   VITAMIN D-VITAMIN K PO Take 1 tablet by mouth daily.     No current facility-administered medications on file prior to visit.   Past Medical History:  Diagnosis Date   Acquired absence of both cervix and uterus    Age-related osteoporosis with current pathological fracture, unspecified site, initial encounter for fracture    Asthma    Barrett's esophagus with low grade dysplasia    Chronic obstructive pulmonary disease (COPD) (HCC)    Major depressive disorder, recurrent severe without psychotic features (HCC)    Mixed hyperlipidemia    Obstructive sleep apnea (adult) (pediatric)    Other obesity due to excess calories    Other pulmonary embolism without acute cor pulmonale (HCC)    Other specified anxiety disorders    Sleep related  leg cramps    Telogen effluvium    Urticaria    Past Surgical History:  Procedure Laterality Date   ABDOMINAL HYSTERECTOMY  1980   partial   right wrist 1985      Family History  Problem Relation Age of Onset   Obesity Mother    Heart attack Father    Allergic rhinitis Sister    Asthma Brother    Renal Disease Brother    Social History   Socioeconomic History   Marital status: Married    Spouse name: Not on file   Number of children: Not on file   Years of education: Not on file   Highest education level: Not on file  Occupational History   Not on file  Tobacco Use   Smoking status: Former    Packs/day: 0.50    Years: 55.00    Pack years: 27.50     Types: E-cigarettes, Cigarettes   Smokeless tobacco: Never   Tobacco comments:    Patient used to smoke up to 2 PPD.  Substance and Sexual Activity   Alcohol use: Never   Drug use: Never   Sexual activity: Not on file  Other Topics Concern   Not on file  Social History Narrative   Not on file   Social Determinants of Health   Financial Resource Strain: Not on file  Food Insecurity: Not on file  Transportation Needs: Not on file  Physical Activity: Not on file  Stress: Not on file  Social Connections: Not on file    Review of Systems  Constitutional:  Negative for chills, fatigue and fever.  HENT:  Negative for congestion, ear pain, rhinorrhea and sore throat.   Respiratory:  Negative for cough and shortness of breath (due to COPD).   Cardiovascular:  Negative for chest pain.  Gastrointestinal:  Negative for abdominal pain, constipation, diarrhea, nausea and vomiting.  Genitourinary:  Negative for dysuria and urgency.  Musculoskeletal:  Positive for arthralgias (x 1 month. Stiff in hands.for a couple of hours in am.) and joint swelling. Negative for back pain and myalgias.  Neurological:  Negative for dizziness, weakness, light-headedness and headaches.  Psychiatric/Behavioral:  Negative for dysphoric mood. The patient is not nervous/anxious.     Objective:  BP 122/62   Pulse 86   Temp (!) 97.3 F (36.3 C)   Ht 5\' 4"  (1.626 m)   Wt 113 lb (51.3 kg)   SpO2 96%   BMI 19.40 kg/m   BP/Weight 11/19/2020 10/16/2020 09/11/2020  Systolic BP 122 102 108  Diastolic BP 62 70 62  Wt. (Lbs) 113 - 113.4  BMI 19.4 - 19.47    Physical Exam Vitals reviewed.  Constitutional:      Appearance: Normal appearance. She is normal weight.  Neck:     Vascular: No carotid bruit.  Cardiovascular:     Rate and Rhythm: Normal rate and regular rhythm.     Pulses: Normal pulses.     Heart sounds: Normal heart sounds.  Pulmonary:     Effort: Pulmonary effort is normal. No respiratory  distress.     Breath sounds: Normal breath sounds.  Abdominal:     General: Abdomen is flat. Bowel sounds are normal.     Palpations: Abdomen is soft.     Tenderness: There is no abdominal tenderness.  Musculoskeletal:        General: Deformity (first mcps protrude out.) present.  Neurological:     Mental Status: She is alert and oriented to  person, place, and time.  Psychiatric:        Mood and Affect: Mood normal.        Behavior: Behavior normal.    Diabetic Foot Exam - Simple   No data filed      Lab Results  Component Value Date   WBC 6.7 08/13/2020   HGB 13.0 08/13/2020   HCT 40.1 08/13/2020   PLT 271 08/13/2020   GLUCOSE 85 08/13/2020   CHOL 137 08/13/2020   TRIG 70 08/13/2020   HDL 67 08/13/2020   LDLCALC 56 08/13/2020   ALT 19 08/13/2020   AST 22 08/13/2020   NA 138 08/13/2020   K 4.4 08/13/2020   CL 100 08/13/2020   CREATININE 0.84 08/13/2020   BUN 13 08/13/2020   CO2 23 08/13/2020   TSH 2.520 08/13/2020      Assessment & Plan:   Problem List Items Addressed This Visit       Respiratory   COPD (chronic obstructive pulmonary disease) (HCC)    Well controlled.  Continue trelegy one inhalation daily.  Albuterol HFA 2 uffs qid prn.       Relevant Medications   promethazine (PHENERGAN) 25 MG tablet   OSA on CPAP    Conitnue cpap.      Chronic respiratory failure with hypoxia (HCC)    Encouraged pt to use oxygen every night and during the day if needed.         Other   RLS (restless legs syndrome)    The current medical regimen is effective;  continue present plan and medications.       Mixed hyperlipidemia - Primary    Well controlled.  No changes to medicines.  Continue to work on eating a healthy diet and exercise.  Labs drawn today.        Relevant Orders   CBC with Differential/Platelet   Comprehensive metabolic panel   Depression, major, recurrent, mild (HCC)    The current medical regimen is effective;  continue present  plan and medications.       Chronic pain syndrome   Arthralgia of both hands    Arthritis panel.      Relevant Orders   Sedimentation Rate   Rheumatoid factor   CYCLIC CITRUL PEPTIDE ANTIBODY, IGG/IGA   Encounter for screening mammogram for malignant neoplasm of breast    Ordered mammogram.      Relevant Orders   MM DIGITAL SCREENING BILATERAL   Other Visit Diagnoses     Need for immunization against influenza       Relevant Orders   Flu Vaccine QUAD High Dose(Fluad) (Completed)   Need for vaccination against Streptococcus pneumoniae       Relevant Orders   Pneumococcal conjugate vaccine 13-valent (Completed)        Meds ordered this encounter  Medications   promethazine (PHENERGAN) 25 MG tablet    Sig: Take 1 tablet (25 mg total) by mouth every 6 (six) hours as needed for nausea or vomiting.    Dispense:  30 tablet    Refill:  1     Orders Placed This Encounter  Procedures   MM DIGITAL SCREENING BILATERAL   Flu Vaccine QUAD High Dose(Fluad)   Pneumococcal conjugate vaccine 13-valent   CBC with Differential/Platelet   Comprehensive metabolic panel   Sedimentation Rate   Rheumatoid factor   CYCLIC CITRUL PEPTIDE ANTIBODY, IGG/IGA     Follow-up: Return in about 3 months (around 02/18/2021) for fasting.  An After  Visit Summary was printed and given to the patient.  Rochel Brome, MD [facility] [phone]

## 2020-11-19 NOTE — Assessment & Plan Note (Signed)
Well controlled.  ?No changes to medicines.  ?Continue to work on eating a healthy diet and exercise.  ?Labs drawn today.  ?

## 2020-11-19 NOTE — Assessment & Plan Note (Signed)
Conitnue cpap.

## 2020-11-19 NOTE — Assessment & Plan Note (Signed)
The current medical regimen is effective;  continue present plan and medications.  

## 2020-11-19 NOTE — Assessment & Plan Note (Signed)
Ordered mammogram.

## 2020-11-19 NOTE — Assessment & Plan Note (Signed)
Arthritis panel. 

## 2020-11-19 NOTE — Assessment & Plan Note (Signed)
Well controlled.  Continue trelegy one inhalation daily.  Albuterol HFA 2 uffs qid prn.

## 2020-11-19 NOTE — Assessment & Plan Note (Signed)
Encouraged pt to use oxygen every night and during the day if needed.

## 2020-11-20 LAB — CBC WITH DIFFERENTIAL/PLATELET
Basophils Absolute: 0 10*3/uL (ref 0.0–0.2)
Basos: 1 %
EOS (ABSOLUTE): 0.1 10*3/uL (ref 0.0–0.4)
Eos: 2 %
Hematocrit: 42.2 % (ref 34.0–46.6)
Hemoglobin: 13.9 g/dL (ref 11.1–15.9)
Immature Grans (Abs): 0 10*3/uL (ref 0.0–0.1)
Immature Granulocytes: 0 %
Lymphocytes Absolute: 2.2 10*3/uL (ref 0.7–3.1)
Lymphs: 37 %
MCH: 30.4 pg (ref 26.6–33.0)
MCHC: 32.9 g/dL (ref 31.5–35.7)
MCV: 92 fL (ref 79–97)
Monocytes Absolute: 0.6 10*3/uL (ref 0.1–0.9)
Monocytes: 10 %
Neutrophils Absolute: 3.1 10*3/uL (ref 1.4–7.0)
Neutrophils: 50 %
Platelets: 220 10*3/uL (ref 150–450)
RBC: 4.57 x10E6/uL (ref 3.77–5.28)
RDW: 13.2 % (ref 11.7–15.4)
WBC: 6 10*3/uL (ref 3.4–10.8)

## 2020-11-20 LAB — COMPREHENSIVE METABOLIC PANEL
ALT: 15 IU/L (ref 0–32)
AST: 19 IU/L (ref 0–40)
Albumin/Globulin Ratio: 2.1 (ref 1.2–2.2)
Albumin: 4.4 g/dL (ref 3.7–4.7)
Alkaline Phosphatase: 67 IU/L (ref 44–121)
BUN/Creatinine Ratio: 16 (ref 12–28)
BUN: 13 mg/dL (ref 8–27)
Bilirubin Total: 0.3 mg/dL (ref 0.0–1.2)
CO2: 26 mmol/L (ref 20–29)
Calcium: 9.5 mg/dL (ref 8.7–10.3)
Chloride: 102 mmol/L (ref 96–106)
Creatinine, Ser: 0.8 mg/dL (ref 0.57–1.00)
Globulin, Total: 2.1 g/dL (ref 1.5–4.5)
Glucose: 98 mg/dL (ref 65–99)
Potassium: 5.1 mmol/L (ref 3.5–5.2)
Sodium: 140 mmol/L (ref 134–144)
Total Protein: 6.5 g/dL (ref 6.0–8.5)
eGFR: 79 mL/min/{1.73_m2} (ref >59)

## 2020-11-20 LAB — SEDIMENTATION RATE: Sed Rate: 2 mm/hr (ref 0–40)

## 2020-11-21 LAB — CYCLIC CITRUL PEPTIDE ANTIBODY, IGG/IGA: Cyclic Citrullin Peptide Ab: 6 units (ref 0–19)

## 2020-11-21 LAB — RHEUMATOID FACTOR: Rheumatoid fact SerPl-aCnc: 10.7 IU/mL (ref <14.0)

## 2020-11-23 ENCOUNTER — Other Ambulatory Visit: Payer: Self-pay | Admitting: Family Medicine

## 2020-11-23 DIAGNOSIS — Z1231 Encounter for screening mammogram for malignant neoplasm of breast: Secondary | ICD-10-CM

## 2020-11-26 DIAGNOSIS — H35372 Puckering of macula, left eye: Secondary | ICD-10-CM | POA: Diagnosis not present

## 2020-12-11 ENCOUNTER — Other Ambulatory Visit: Payer: Self-pay | Admitting: Physician Assistant

## 2020-12-11 DIAGNOSIS — F321 Major depressive disorder, single episode, moderate: Secondary | ICD-10-CM

## 2020-12-17 DIAGNOSIS — G894 Chronic pain syndrome: Secondary | ICD-10-CM | POA: Diagnosis not present

## 2020-12-17 DIAGNOSIS — M5416 Radiculopathy, lumbar region: Secondary | ICD-10-CM | POA: Diagnosis not present

## 2020-12-17 DIAGNOSIS — S22050D Wedge compression fracture of T5-T6 vertebra, subsequent encounter for fracture with routine healing: Secondary | ICD-10-CM | POA: Diagnosis not present

## 2020-12-17 DIAGNOSIS — J4530 Mild persistent asthma, uncomplicated: Secondary | ICD-10-CM | POA: Diagnosis not present

## 2020-12-17 DIAGNOSIS — M47816 Spondylosis without myelopathy or radiculopathy, lumbar region: Secondary | ICD-10-CM | POA: Diagnosis not present

## 2020-12-17 DIAGNOSIS — J301 Allergic rhinitis due to pollen: Secondary | ICD-10-CM | POA: Diagnosis not present

## 2020-12-17 DIAGNOSIS — R202 Paresthesia of skin: Secondary | ICD-10-CM | POA: Diagnosis not present

## 2020-12-17 DIAGNOSIS — Z1389 Encounter for screening for other disorder: Secondary | ICD-10-CM | POA: Diagnosis not present

## 2020-12-17 DIAGNOSIS — Z79891 Long term (current) use of opiate analgesic: Secondary | ICD-10-CM | POA: Diagnosis not present

## 2020-12-17 DIAGNOSIS — J019 Acute sinusitis, unspecified: Secondary | ICD-10-CM | POA: Diagnosis not present

## 2020-12-17 DIAGNOSIS — J069 Acute upper respiratory infection, unspecified: Secondary | ICD-10-CM | POA: Diagnosis not present

## 2020-12-17 DIAGNOSIS — Z87891 Personal history of nicotine dependence: Secondary | ICD-10-CM | POA: Diagnosis not present

## 2020-12-17 DIAGNOSIS — S3210XA Unspecified fracture of sacrum, initial encounter for closed fracture: Secondary | ICD-10-CM | POA: Diagnosis not present

## 2020-12-18 ENCOUNTER — Ambulatory Visit (INDEPENDENT_AMBULATORY_CARE_PROVIDER_SITE_OTHER): Payer: Medicare Other

## 2020-12-18 NOTE — Progress Notes (Signed)
Chronic Care Management Pharmacy Note  12/18/2020 Name:  Ellen Holloway MRN:  [id number] DOB:  1950-03-28  Summary: -Patient has no Prolia injection scheduled and 6 months is due October 2022, will coordinate with Shelle Iron to get injection next week at [facility]   Subjective: Ellen Holloway is an 70 y.o. year old female who is a primary patient of Cox, Kirsten, MD.  The CCM team was consulted for assistance with disease management and care coordination needs.    Engaged with patient by telephone for follow up visit in response to provider referral for pharmacy case management and/or care coordination services.   Consent to Services:  The patient was given the following information about Chronic Care Management services today, agreed to services, and gave verbal consent: 1. CCM service includes personalized support from designated clinical staff supervised by the primary care provider, including individualized plan of care and coordination with other care providers 2. 24/7 contact phone numbers for assistance for urgent and routine care needs. 3. Service will only be billed when office clinical staff spend 20 minutes or more in a month to coordinate care. 4. Only one practitioner may furnish and bill the service in a calendar month. 5.The patient may stop CCM services at any time (effective at the end of the month) by phone call to the office staff. 6. The patient will be responsible for cost sharing (co-pay) of up to 20% of the service fee (after annual deductible is met). Patient agreed to services and consent obtained.  Patient Care Team: Rochel Brome, MD as PCP - General (Family Medicine) Burnice Logan, [facility] (Inactive) as Pharmacist (Pharmacist)  Recent office visits:  08/14/20-Sara Kathie Rhodes (PCP)- bursitis of right elbow, no medication changes, return PRN.   Recent consult visits:  09/11/20-Dr Kozlow, Asthma and Allergy, Patient reported not taking Prolia or Mirapex, started Atrovent nasal  spray.    Hospital visits:  None in previous 6 months   Objective:  Lab Results  Component Value Date   CREATININE 0.80 11/19/2020   BUN 13 11/19/2020   GFRNONAA 76 05/09/2020   GFRAA 87 05/09/2020   NA 140 11/19/2020   K 5.1 11/19/2020   CALCIUM 9.5 11/19/2020   CO2 26 11/19/2020   GLUCOSE 98 11/19/2020    No results found for: HGBA1C, FRUCTOSAMINE, GFR, MICROALBUR  Last diabetic Eye exam: No results found for: HMDIABEYEEXA  Last diabetic Foot exam: No results found for: HMDIABFOOTEX   Lab Results  Component Value Date   CHOL 137 08/13/2020   HDL 67 08/13/2020   LDLCALC 56 08/13/2020   TRIG 70 08/13/2020   CHOLHDL 2.0 08/13/2020    Hepatic Function Latest Ref Rng & Units 11/19/2020 08/13/2020 05/09/2020  Total Protein 6.0 - 8.5 g/dL 6.5 6.4 6.8  Albumin 3.7 - 4.7 g/dL 4.4 4.0 4.2  AST 0 - 40 IU/L '19 22 19  ' ALT 0 - 32 IU/L '15 19 14  ' Alk Phosphatase 44 - 121 IU/L 67 99 90  Total Bilirubin 0.0 - 1.2 mg/dL 0.3 0.2 0.3    Lab Results  Component Value Date/Time   TSH 2.520 08/13/2020 08:10 AM    CBC Latest Ref Rng & Units 11/19/2020 08/13/2020 05/09/2020  WBC 3.4 - 10.8 x10E3/uL 6.0 6.7 9.1  Hemoglobin 11.1 - 15.9 g/dL 13.9 13.0 13.6  Hematocrit 34.0 - 46.6 % 42.2 40.1 41.3  Platelets 150 - 450 x10E3/uL 220 271 265    No results found for: VD25OH  Clinical ASCVD: No  The 10-year ASCVD risk score (Arnett DK, et al., 2019) is: 8.5%   Values used to calculate the score:     Age: 46 years     Sex: Female     Is Non-Hispanic African American: No     Diabetic: No     Tobacco smoker: No     Systolic Blood Pressure: 902 mmHg     Is BP treated: No     HDL Cholesterol: 67 mg/dL     Total Cholesterol: 137 mg/dL    Depression screen [facility] 2/9 11/19/2020 08/13/2020 08/13/2020  Decreased Interest 0 1 0  Down, Depressed, Hopeless 0 1 0  PHQ - 2 Score 0 2 0  Altered sleeping 0 0 -  Tired, decreased energy 0 0 -  Change in appetite 2 0 -  Feeling bad or failure about yourself   0 0 -  Trouble concentrating 0 0 -  Moving slowly or fidgety/restless 0 0 -  Suicidal thoughts 0 0 -  PHQ-9 Score 2 2 -  Difficult doing work/chores Not difficult at all - -     Other: (CHADS2VASc if Afib, MMRC or CAT for COPD, ACT, DEXA)  Social History   Tobacco Use  Smoking Status Former   Packs/day: 0.50   Years: 55.00   Pack years: 27.50   Types: E-cigarettes, Cigarettes  Smokeless Tobacco Never  Tobacco Comments   Patient used to smoke up to 2 PPD.   BP Readings from Last 3 Encounters:  11/19/20 122/62  10/16/20 102/70  09/11/20 108/62   Pulse Readings from Last 3 Encounters:  11/19/20 86  10/16/20 80  09/11/20 84   Wt Readings from Last 3 Encounters:  11/19/20 113 lb (51.3 kg)  09/11/20 113 lb 6.4 oz (51.4 kg)  08/14/20 114 lb (51.7 kg)   BMI Readings from Last 3 Encounters:  11/19/20 19.40 kg/m  09/11/20 19.47 kg/m  08/14/20 19.57 kg/m    Assessment/Interventions: Review of patient past medical history, allergies, medications, health status, including review of consultants reports, laboratory and other test data, was performed as part of comprehensive evaluation and provision of chronic care management services.   SDOH:  (Social Determinants of Health) assessments and interventions performed: Yes SDOH Interventions    Flowsheet Row Most Recent Value  SDOH Interventions   Financial Strain Interventions Intervention Not Indicated  Transportation Interventions Intervention Not Indicated      SDOH Screenings   Alcohol Screen: Low Risk    Last Alcohol Screening Score (AUDIT): 1  Depression (PHQ2-9): Low Risk    PHQ-2 Score: 2  Financial Resource Strain: Low Risk    Difficulty of Paying Living Expenses: Not hard at all  Food Insecurity: Not on file  Housing: Not on file  Physical Activity: Not on file  Social Connections: Not on file  Stress: Not on file  Tobacco Use: Medium Risk   Smoking Tobacco Use: Former   Smokeless Tobacco Use: Never   Transportation Needs: No Transportation Needs   Lack of Transportation (Medical): No   Lack of Transportation (Non-Medical): No    CCM Care Plan  Allergies  Allergen Reactions   Sulfamethoxazole Anaphylaxis   Sulfa Antibiotics Hives    Medications Reviewed Today     Reviewed by Rochel Brome, MD (Physician) on 11/19/20 at 2048  Med List Status: <None>   Medication Order Taking? Sig Documenting Provider Last Dose Status Informant  albuterol (PROVENTIL) (2.5 MG/3ML) 0.083% nebulizer solution [id number] No Take 2.5 mg by nebulization every 6 (six)  hours as needed for wheezing or shortness of breath. [provider] Taking Active   atorvastatin (LIPITOR) 10 MG tablet [id number] No TAKE 1 TABLET BY MOUTH EVERY DAY Cox, Kirsten, MD Taking Active   calcium carbonate (OSCAL) 1500 (600 Ca) MG TABS tablet [id number] No Take 600 mg of elemental calcium by mouth daily. [provider] Taking Active   clonazePAM (KLONOPIN) 1 MG tablet [id number] No TAKE 1 TABLET BY MOUTH 2 TIMES DAILY AS NEEDED FOR ANXIETY. Cox, Kirsten, MD Taking Active   cyclobenzaprine (FLEXERIL) 10 MG tablet [id number]  TAKE 1 TABLET BY MOUTH EVERYDAY AT BEDTIME Rochel Brome, MD  Active   famotidine (PEPCID) 40 MG tablet [id number] No TAKE 1 TABLET BY MOUTH EVERYDAY AT BEDTIME Lillard Anes, MD Taking Active   Fluticasone-Umeclidin-Vilant (TRELEGY ELLIPTA) 100-62.5-25 MCG/INH AEPB [id number] No Inhale 1 puff into the lungs daily as needed. [provider] Taking Active   furosemide (LASIX) 20 MG tablet [id number] No TAKE 1 TABLET (20 MG TOTAL) BY MOUTH DAILY AS NEEDED. AS NEEDED FOR SWELLING Marge Duncans, PA-C Taking Active   ipratropium (ATROVENT) 0.06 % nasal spray [id number] No 2 sprays each nostril every 6 hours Kozlow, Donnamarie Poag, MD Taking Active   oxyCODONE-acetaminophen (PERCOCET) 10-325 MG tablet [id number] No Take 1 tablet by mouth every 6 (six) hours. [provider] Taking Active   OXYGEN  [phone] No Inhale into the lungs. 2 L at night. [provider] Taking Active   pantoprazole (PROTONIX) 40 MG tablet [id number] No TAKE 1 TABLET BY MOUTH TWICE A DAY Cox, Kirsten, MD Taking Active   pramipexole (MIRAPEX) 1 MG tablet [id number]  TAKE 1 TABLET BY MOUTH EVERYDAY AT BEDTIME Cox, Kirsten, MD  Active   promethazine (PHENERGAN) 25 MG tablet [id number]  Take 1 tablet (25 mg total) by mouth every 6 (six) hours as needed for nausea or vomiting. Rochel Brome, MD  Active   sertraline (ZOLOFT) 100 MG tablet [id number] No TAKE 2 TABLETS BY MOUTH EVERY DAY Marge Duncans, PA-C Taking Active   spironolactone (ALDACTONE) 25 MG tablet [id number] No TAKE 1 TABLET BY MOUTH EVERY DAY Lillard Anes, MD Taking Active   VITAMIN D-VITAMIN K PO [id number] No Take 1 tablet by mouth daily. [provider] Taking Active             Patient Active Problem List   Diagnosis Date Noted   Chronic pain syndrome 11/19/2020   Arthralgia of both hands 11/19/2020   Encounter for screening mammogram for malignant neoplasm of breast 11/19/2020   Chronic respiratory failure with hypoxia ([place]) 11/19/2020   Mixed hyperlipidemia 11/24/2019   Depression, major, recurrent, mild ([place]) 11/24/2019   Seasonal allergic rhinitis due to pollen 08/27/2019   RLS (restless legs syndrome) 08/27/2019   OSA on CPAP 08/27/2019   Chronic midline low back pain without sciatica 08/27/2019   Age-related osteoporosis with current pathological fracture 04/30/2019   Encounter for screening for lung cancer 04/27/2019   COPD (chronic obstructive pulmonary disease) ([place]) 03/23/2018    Immunization History  Administered Date(s) Administered   Fluad Quad(high Dose 65+) 11/24/2019, 11/19/2020   Influenza-Unspecified 12/05/2018   Moderna Sars-Covid-2 Vaccination 05/17/2019, 06/14/2019, 02/13/2020   Pneumococcal Conjugate-13 11/19/2020   Pneumococcal Polysaccharide-23 12/22/2015   Tdap 06/30/2017    Conditions to be  addressed/monitored:  Hypertension, Hyperlipidemia, Diabetes, COPD, Depression, and Anxiety  Care Plan : [place]  Updates made by Lane Hacker, [place] since 12/18/2020 12:00 AM     Problem:  COPD, HTN, Lipids, Mental Health   Priority: High  Onset Date: 12/18/2020     Long-Range Goal: Disease State Management   Start Date: 12/18/2020  Expected End Date: 12/18/2021  This Visit's Progress: On track  Priority: High  Note:   Current Barriers:  Does not maintain contact with provider office Does not contact provider office for questions/concerns  Pharmacist Clinical Goal(s):  Patient will contact provider office for questions/concerns as evidenced notation of same in electronic health record through collaboration with PharmD and provider.   Interventions: 1:1 collaboration with Cox, Kirsten, MD regarding development and update of comprehensive plan of care as evidenced by provider attestation and co-signature Inter-disciplinary care team collaboration (see longitudinal plan of care) Comprehensive medication review performed; medication list updated in electronic medical record  Edema -Controlled -Current treatment: Furosemide 5m PRN Spironolactone 24mQD -Medications previously tried: N/A -Current exercise habits: Remodeling house -Recommended to continue current medication  Hyperlipidemia: (LDL goal < 100) The 10-year ASCVD risk score (Arnett DK, et al., 2019) is: 8.5%   Values used to calculate the score:     Age: [age]     Sex: Female     Is Non-Hispanic African American: No     Diabetic: No     Tobacco smoker: No     Systolic Blood Pressure: 12294mHg     Is BP treated: No     HDL Cholesterol: 67 mg/dL     Total Cholesterol: 137 mg/dL Lab Results  Component Value Date   CHOL 137 08/13/2020   CHOL 186 05/09/2020   CHOL 156 08/23/2019   Lab Results  Component Value Date   HDL 67 08/13/2020   HDL 76 05/09/2020   HDL 74 08/23/2019   Lab  Results  Component Value Date   LDLCALC 56 08/13/2020   LDLCALC 90 05/09/2020   LDLCALC 64 08/23/2019   Lab Results  Component Value Date   TRIG 70 08/13/2020   TRIG 114 05/09/2020   TRIG 101 08/23/2019   Lab Results  Component Value Date   CHOLHDL 2.0 08/13/2020   CHOLHDL 2.4 05/09/2020   CHOLHDL 2.1 08/23/2019  No results found for: LDLDIRECT -Controlled -Current treatment: Atorvastatin 1011mMedications previously tried: N/A  -Current dietary patterns: "Tries to eat healthy" -Current exercise habits: Remodeling house -Educated on Cholesterol goals;  Over 15 minutes spent counseling patient on role of hyperlipidemia on heart disease and the impact of each lipid subclass with emphasis on LDL and heart disease risk.  Explained role of statins in prevention of CV disease complications and actual risk for adverse effects with statin treatment.  Counseled patient on genetic component placing them at risk for hyperlipidemia. -Recommended to continue current medication  Depression/Anxiety -Patient adamantly stated multiple times during visit she is content with therapy -Controlled -Current treatment: Sertraline 100m20monazepam 1mg 42m PRN -Medications previously tried/failed: N/A -PHQ9:  [facility] 08/13/2020 08/13/2020  Decreased Interest 0 1 0  Down, Depressed, Hopeless 0 1 0  PHQ - 2 Score 0 2 0  Altered sleeping 0 0 -  Tired, decreased energy 0 0 -  Change in appetite 2 0 -  Feeling bad or failure about yourself  0 0 -  Trouble concentrating 0 0 -  Moving slowly or fidgety/restless 0 0 -  Suicidal thoughts 0 0 -  PHQ-9 Score 2 2 -  Difficult doing work/chores Not difficult at all - -  -GAD7: No flowsheet data found. -Educated on Benefits of medication  for symptom control -Recommended to continue current medication  Osteoporosis / Osteopenia (Goal ) -Uncontrolled -Last DEXA Scan: 03/15/2019             T-Score lumbar spine: -2              T-Score forearm radius: -2.1 -Current treatment  Prolia (Last injection April 27th 2022) Calcium 600 mg daily Vitamin D with K 5000 units daily -We discussed:  Recommend [phone] units of vitamin D daily. Recommend 1200 mg of calcium daily from dietary and supplemental sources. Recommend weight-bearing and muscle strengthening exercises for building and maintaining bone density.[name] Has had 4 broken bones in last year (rib and compression fracture).  -She feels frustrated by her limitations of the back pain from osteoporosis fractures. She typically enjoys being active gardening, painting, Insurance risk surveyor. She feels frustrated by wanting to do things but unable to complete it.  -Patient eats dairy daily. She enjoys milk, sour cream, ice cream.  October 2022: Will make sure has appt for Prolia before end of month, will ask Shelle Iron to do at [facility] on [facility]   Restless Leg    Patient has failed these meds in past: n/a Patient is currently controlled on the following medications:  Pramipexole 1 mg at bedtime  Plan: At goal,  patient stable/ symptoms controlled   Barrett's Esophagus    Patient has failed these meds in past: none reported Patient is currently controlled on the following medications:  Famotidine 40 mg daily at bedtime Pantoprazole 40 mg twice daily  We discussed:  Reports that her symptoms are well controlled. Supposed to call GI when she has any pain.  Plan  Continue current medications  COPD / Asthma / Tobacco    CAT: No flowsheet data found.  Tobacco Status:  Social History        Tobacco Use  Smoking Status Current Every Day Smoker   Packs/day: 0.50   Years: 55.00   Pack years: 27.50   Types: E-cigarettes  Smokeless Tobacco Never Used  Tobacco Comment    Patient used to smoke up to 2 PPD.     Patient has failed these meds in past: Breo ellipta, benadryl, sudafed, allegra, claritin, zyrtec, Monteleukast Patient is currently  controlled on the following medications:  Albuterol 0.083% nebulizer solution q6 hours prn shortness of breath Fexofenadine 180 mg daily  Trelegy inhaler uses as she begins with congestion Using maintenance inhaler regularly? No Frequency of rescue inhaler use:  infrequently Plan  Continue current medications  Patient Goals/Self-Care Activities Patient will:  - take medications as prescribed  Follow Up Plan: The care management team will reach out to the patient again over the next 90 days.  CPP F/U March 2023: Arizona Constable, Pharm.D. - [phone]        Medication Assistance: None required.  Patient affirms current coverage meets needs.  Compliance/Adherence/Medication fill history:  Medications Atorvastatin                 07/28/20            90 Furosemide                 07/09/20            90 ^Patient affirms compliance   Care Gaps: Last annual wellness visit? 08/30/19 Maren Reamer scheduled next week  Patient[facility]  [id number] [id number]: [phone]  Fax: [phone]  Uses pill box? No -   Pt endorses 100% compliance  Care Plan and Follow Up Patient Decision:  Patient agrees to Care Plan and Follow-up.  Plan: The patient has been provided with contact information for the care management team and has been advised to call with any health related questions or concerns.   CPP F/U March 2023 Arizona Constable, Sherian Rein.D. - [phone]

## 2020-12-18 NOTE — Patient Instructions (Signed)
Visit Information   Goals Addressed   None    Patient Care Plan: CCM Pharmacy Care Plan     Problem Identified: COPD, HTN, Lipids, Mental Health   Priority: High  Onset Date: 12/18/2020     Long-Range Goal: Disease State Management   Start Date: 12/18/2020  Expected End Date: 12/18/2021  This Visit's Progress: On track  Priority: High  Note:   Current Barriers:  Does not maintain contact with provider office Does not contact provider office for questions/concerns  Pharmacist Clinical Goal(s):  Patient will contact provider office for questions/concerns as evidenced notation of same in electronic health record through collaboration with PharmD and provider.   Interventions: 1:1 collaboration with Cox, Kirsten, MD regarding development and update of comprehensive plan of care as evidenced by provider attestation and co-signature Inter-disciplinary care team collaboration (see longitudinal plan of care) Comprehensive medication review performed; medication list updated in electronic medical record  Edema -Controlled -Current treatment: Furosemide 20mg  PRN Spironolactone 25mg  QD -Medications previously tried: N/A -Current exercise habits: Remodeling house -Recommended to continue current medication  Hyperlipidemia: (LDL goal < 100) The 10-year ASCVD risk score (Arnett DK, et al., 2019) is: 8.5%   Values used to calculate the score:     Age: 70 years     Sex: Female     Is Non-Hispanic African American: No     Diabetic: No     Tobacco smoker: No     Systolic Blood Pressure: 122 mmHg     Is BP treated: No     HDL Cholesterol: 67 mg/dL     Total Cholesterol: 137 mg/dL Lab Results  Component Value Date   CHOL 137 08/13/2020   CHOL 186 05/09/2020   CHOL 156 08/23/2019   Lab Results  Component Value Date   HDL 67 08/13/2020   HDL 76 05/09/2020   HDL 74 08/23/2019   Lab Results  Component Value Date   LDLCALC 56 08/13/2020   LDLCALC 90 05/09/2020   LDLCALC 64  08/23/2019   Lab Results  Component Value Date   TRIG 70 08/13/2020   TRIG 114 05/09/2020   TRIG 101 08/23/2019   Lab Results  Component Value Date   CHOLHDL 2.0 08/13/2020   CHOLHDL 2.4 05/09/2020   CHOLHDL 2.1 08/23/2019  No results found for: LDLDIRECT -Controlled -Current treatment: Atorvastatin 10mg  -Medications previously tried: N/A  -Current dietary patterns: "Tries to eat healthy" -Current exercise habits: Remodeling house -Educated on Cholesterol goals;  Over 15 minutes spent counseling patient on role of hyperlipidemia on heart disease and the impact of each lipid subclass with emphasis on LDL and heart disease risk.  Explained role of statins in prevention of CV disease complications and actual risk for adverse effects with statin treatment.  Counseled patient on genetic component placing them at risk for hyperlipidemia. -Recommended to continue current medication  Depression/Anxiety -Patient adamantly stated multiple times during visit she is content with therapy -Controlled -Current treatment: Sertraline 100mg  Clonazepam 1mg  BID PRN -Medications previously tried/failed: N/A -PHQ9:  Depression screen [facility] 2/9 11/19/2020 08/13/2020 08/13/2020  Decreased Interest 0 1 0  Down, Depressed, Hopeless 0 1 0  PHQ - 2 Score 0 2 0  Altered sleeping 0 0 -  Tired, decreased energy 0 0 -  Change in appetite 2 0 -  Feeling bad or failure about yourself  0 0 -  Trouble concentrating 0 0 -  Moving slowly or fidgety/restless 0 0 -  Suicidal thoughts 0 0 -  PHQ-9  Score 2 2 -  Difficult doing work/chores Not difficult at all - -  -GAD7: No flowsheet data found. -Educated on Benefits of medication for symptom control -Recommended to continue current medication  Osteoporosis / Osteopenia (Goal ) -Uncontrolled -Last DEXA Scan: 03/15/2019             T-Score lumbar spine: -2             T-Score forearm radius: -2.1 -Current treatment  Prolia (Last injection April 27th  2022) Calcium 600 mg daily Vitamin D with K 5000 units daily -We discussed:  Recommend [phone] units of vitamin D daily. Recommend 1200 mg of calcium daily from dietary and supplemental sources. Recommend weight-bearing and muscle strengthening exercises for building and maintaining bone density.[name] Has had 4 broken bones in last year (rib and compression fracture).  -She feels frustrated by her limitations of the back pain from osteoporosis fractures. She typically enjoys being active gardening, painting, Museum/gallery exhibitions officer. She feels frustrated by wanting to do things but unable to complete it.  -Patient eats dairy daily. She enjoys milk, sour cream, ice cream.  October 2022: Will make sure has appt for Prolia before end of month, will ask Creola Corn to do at [facility] on [facility]th   Restless Leg    Patient has failed these meds in past: n/a Patient is currently controlled on the following medications:  Pramipexole 1 mg at bedtime  Plan: At goal,  patient stable/ symptoms controlled   Barrett's Esophagus    Patient has failed these meds in past: none reported Patient is currently controlled on the following medications:  Famotidine 40 mg daily at bedtime Pantoprazole 40 mg twice daily  We discussed:  Reports that her symptoms are well controlled. Supposed to call GI when she has any pain.  Plan  Continue current medications  COPD / Asthma / Tobacco    CAT: No flowsheet data found.  Tobacco Status:  Social History        Tobacco Use  Smoking Status Current Every Day Smoker   Packs/day: 0.50   Years: 55.00   Pack years: 27.50   Types: E-cigarettes  Smokeless Tobacco Never Used  Tobacco Comment    Patient used to smoke up to 2 PPD.     Patient has failed these meds in past: Breo ellipta, benadryl, sudafed, allegra, claritin, zyrtec, Monteleukast Patient is currently controlled on the following medications:  Albuterol 0.083% nebulizer solution q6 hours prn  shortness of breath Fexofenadine 180 mg daily  Trelegy inhaler uses as she begins with congestion Using maintenance inhaler regularly? No Frequency of rescue inhaler use:  infrequently Plan  Continue current medications  Patient Goals/Self-Care Activities Patient will:  - take medications as prescribed  Follow Up Plan: The care management team will reach out to the patient again over the next 90 days.  CPP F/U March 2023: Artelia Laroche, Pharm.D. - [phone]        The patient verbalized understanding of instructions, educational materials, and care plan provided today and declined offer to receive copy of patient instructions, educational materials, and care plan.  The pharmacy team will reach out to the patient again over the next 90 days.   Zettie Pho, [facility]

## 2020-12-19 DIAGNOSIS — R051 Acute cough: Secondary | ICD-10-CM | POA: Diagnosis not present

## 2020-12-22 ENCOUNTER — Encounter: Payer: Self-pay | Admitting: Family Medicine

## 2020-12-22 DIAGNOSIS — Z1159 Encounter for screening for other viral diseases: Secondary | ICD-10-CM

## 2020-12-25 ENCOUNTER — Other Ambulatory Visit: Payer: Self-pay | Admitting: Family Medicine

## 2020-12-25 DIAGNOSIS — Z1211 Encounter for screening for malignant neoplasm of colon: Secondary | ICD-10-CM

## 2020-12-26 ENCOUNTER — Ambulatory Visit (INDEPENDENT_AMBULATORY_CARE_PROVIDER_SITE_OTHER): Payer: Medicare Other

## 2020-12-26 ENCOUNTER — Other Ambulatory Visit: Payer: Self-pay

## 2020-12-26 VITALS — BP 122/80 | HR 87 | Resp 18 | Ht 64.0 in | Wt 111.8 lb

## 2020-12-26 DIAGNOSIS — Z0000 Encounter for general adult medical examination without abnormal findings: Secondary | ICD-10-CM

## 2020-12-26 DIAGNOSIS — M810 Age-related osteoporosis without current pathological fracture: Secondary | ICD-10-CM | POA: Diagnosis not present

## 2020-12-26 DIAGNOSIS — Z23 Encounter for immunization: Secondary | ICD-10-CM

## 2020-12-26 DIAGNOSIS — Z7189 Other specified counseling: Secondary | ICD-10-CM | POA: Diagnosis not present

## 2020-12-26 NOTE — Progress Notes (Signed)
Subjective:   Ellen Holloway is a 70 y.o. female who presents for Medicare Annual (Subsequent) preventive examination.  This wellness visit is conducted by a nurse.  The patient's medications were reviewed and reconciled since the patient's last visit.  History details were provided by the patient.  The history appears to be reliable.    Patient's last AWV was one year ago.   Medical History: Patient history and Family history was reviewed  Medications, Allergies, and preventative health maintenance was reviewed and updated.   Review of Systems    Review of Systems  Constitutional: Negative.   HENT: Negative.    Respiratory: Negative.  Negative for apnea and cough.   Cardiovascular: Negative.  Negative for chest pain and palpitations.  Gastrointestinal: Negative.   Genitourinary: Negative.   Musculoskeletal:  Positive for back pain.  Psychiatric/Behavioral:  Negative for suicidal ideas. The patient is nervous/anxious.    Cardiac Risk Factors include: advanced age (>56men, >43 women);smoking/ tobacco exposure     Objective:    Today's Vitals   12/26/20 0907  BP: 122/80  Pulse: 87  Resp: 18  SpO2: 92%  Weight: 111 lb 12.8 oz (50.7 kg)  Height: 5\' 4"  (1.626 m)  PainSc: 4   PainLoc: Back   Body mass index is 19.19 kg/m.  Advanced Directives 12/26/2020 08/30/2019  Does Patient Have a Medical Advance Directive? No No  Would patient like information on creating a medical advance directive? Yes (MAU/Ambulatory/Procedural Areas - Information given) No - Patient declined    Current Medications (verified) Outpatient Encounter Medications as of 12/26/2020  Medication Sig   albuterol (PROVENTIL) (2.5 MG/3ML) 0.083% nebulizer solution Take 2.5 mg by nebulization every 6 (six) hours as needed for wheezing or shortness of breath.   atorvastatin (LIPITOR) 10 MG tablet TAKE 1 TABLET BY MOUTH EVERY DAY   calcium carbonate (OSCAL) 1500 (600 Ca) MG TABS tablet Take 600 mg of elemental  calcium by mouth daily.   clonazePAM (KLONOPIN) 1 MG tablet TAKE 1 TABLET BY MOUTH 2 TIMES DAILY AS NEEDED FOR ANXIETY.   cyclobenzaprine (FLEXERIL) 10 MG tablet TAKE 1 TABLET BY MOUTH EVERYDAY AT BEDTIME   famotidine (PEPCID) 40 MG tablet TAKE 1 TABLET BY MOUTH EVERYDAY AT BEDTIME   Fluticasone-Umeclidin-Vilant (TRELEGY ELLIPTA) 100-62.5-25 MCG/INH AEPB Inhale 1 puff into the lungs daily as needed.   furosemide (LASIX) 20 MG tablet TAKE 1 TABLET (20 MG TOTAL) BY MOUTH DAILY AS NEEDED. AS NEEDED FOR SWELLING   ipratropium (ATROVENT) 0.06 % nasal spray 2 sprays each nostril every 6 hours   oxyCODONE-acetaminophen (PERCOCET) 10-325 MG tablet Take 1 tablet by mouth every 6 (six) hours.   OXYGEN Inhale into the lungs. 2 L at night.   pantoprazole (PROTONIX) 40 MG tablet TAKE 1 TABLET BY MOUTH TWICE A DAY   pramipexole (MIRAPEX) 1 MG tablet TAKE 1 TABLET BY MOUTH EVERYDAY AT BEDTIME   promethazine (PHENERGAN) 25 MG tablet Take 1 tablet (25 mg total) by mouth every 6 (six) hours as needed for nausea or vomiting.   RESTASIS 0.05 % ophthalmic emulsion 1 drop 2 (two) times daily.   sertraline (ZOLOFT) 100 MG tablet TAKE 2 TABLETS BY MOUTH EVERY DAY   spironolactone (ALDACTONE) 25 MG tablet TAKE 1 TABLET BY MOUTH EVERY DAY   VITAMIN D-VITAMIN K PO Take 1 tablet by mouth daily.   No facility-administered encounter medications on file as of 12/26/2020.    Allergies (verified) Sulfamethoxazole and Sulfa antibiotics   History: Past Medical History:  Diagnosis Date   Acquired absence of both cervix and uterus    Age-related osteoporosis with current pathological fracture, unspecified site, initial encounter for fracture    Asthma    Barrett's esophagus with low grade dysplasia    Chronic obstructive pulmonary disease (COPD) (HCC)    Major depressive disorder, recurrent severe without psychotic features (HCC)    Mixed hyperlipidemia    Obstructive sleep apnea (adult) (pediatric)    Other obesity  due to excess calories    Other pulmonary embolism without acute cor pulmonale (HCC)    Other specified anxiety disorders    Sleep related leg cramps    Telogen effluvium    Urticaria    Past Surgical History:  Procedure Laterality Date   ABDOMINAL HYSTERECTOMY  1980   partial   right wrist 1985     Family History  Problem Relation Age of Onset   Obesity Mother    Heart attack Father    Allergic rhinitis Sister    Asthma Brother    Renal Disease Brother    Social History   Socioeconomic History   Marital status: Married    Spouse name: Mellody Dance   Number of children: 2  Tobacco Use   Smoking status: Former    Packs/day: 0.50    Years: 55.00    Pack years: 27.50    Types: E-cigarettes, Cigarettes   Smokeless tobacco: Never   Tobacco comments:    Patient used to smoke up to 2 PPD.  Substance and Sexual Activity   Alcohol use: Yes    Alcohol/week: 1.0 standard drink    Types: 1 Cans of beer per week    Comment: occasional beer   Drug use: Never   Sexual activity: Not on file   Social Determinants of Health   Financial Resource Strain: Low Risk    Difficulty of Paying Living Expenses: Not hard at all  Food Insecurity: No Food Insecurity   Worried About Programme researcher, broadcasting/film/video in the Last Year: Never true   Ran Out of Food in the Last Year: Never true  Transportation Needs: No Transportation Needs   Lack of Transportation (Medical): No   Lack of Transportation (Non-Medical): No  Physical Activity: Not on file  Stress: Not on file  Social Connections: Not on file    Tobacco Counseling Counseling given: Patient no longer uses tobacco products   Clinical Intake:  Pre-visit preparation completed: Yes  Pain : 0-10 Pain Score: 4  Pain Type: orthopedic pain (managed by pain management) Pain Location: Back   BMI - recorded: 19.19 Nutritional Status: BMI of 19-24  Normal Nutritional Risks: None Diabetes: No How often do you need to have someone help you when you  read instructions, pamphlets, or other written materials from your doctor or pharmacy?: 1 - Never Interpreter Needed?: No    Activities of Daily Living In your present state of health, do you have any difficulty performing the following activities: 12/26/2020 11/19/2020  Hearing? N N  Vision? N N  Difficulty concentrating or making decisions? N N  Walking or climbing stairs? N N  Dressing or bathing? N N  Doing errands, shopping? N N  Preparing Food and eating ? N -  Using the Toilet? N -  In the past six months, have you accidently leaked urine? N -  Do you have problems with loss of bowel control? N -  Managing your Medications? N -  Managing your Finances? N -  Housekeeping or managing your  Housekeeping? N -  Some recent data might be hidden    Patient Care Team: Blane Ohara, MD as PCP - General (Family Medicine) Misenheimer, Marcial Pacas, MD as Consulting Physician (Gastroenterology) Marcellus Scott, MD as Referring Physician (Pulmonary Disease) Earvin Hansen, [facility] (Inactive) as Pharmacist (Pharmacist) Lucie Leather Alvira Philips, MD as Consulting Physician (Allergy and Immunology) Florene Glen (Orthopedic Surgery)     Assessment:   This is a routine wellness examination for [place].  Dietary issues and exercise activities discussed: Current Exercise Habits: The patient does not participate in regular exercise at present (however patient is very active), Exercise limited by: orthopedic condition(s)  Depression Screen PHQ 2/9 Scores 12/26/2020 11/19/2020 08/13/2020 08/13/2020 05/09/2020 11/24/2019 10/12/2019  PHQ - 2 Score 0 0 2 0 0 5 1  PHQ- 9 Score - 2 2 - - 17 -    Fall Risk Fall Risk  12/26/2020 11/19/2020 08/13/2020 05/09/2020 08/30/2019  Falls in the past year? 0 0 0 1 1  Comment - - - - -  Number falls in past yr: 0 0 0 1 1  Comment - - - - -  Injury with Fall? 0 0 0 1 1  Risk for fall due to : No Fall Risks No Fall Risks History of fall(s) - History of fall(s)  Follow up Falls  evaluation completed Falls evaluation completed Falls evaluation completed;Falls prevention discussed - Falls evaluation completed;Falls prevention discussed;Education provided    Cognitive Function:     6CIT Screen 12/26/2020 08/31/2019  What Year? 0 points 0 points  What month? 0 points 0 points  What time? 0 points 0 points  Count back from 20 0 points 0 points  Months in reverse 0 points 2 points  Repeat phrase 0 points 2 points  Total Score 0 4    Immunizations Immunization History  Administered Date(s) Administered   Fluad Quad(high Dose 65+) 11/24/2019, 11/19/2020   Influenza-Unspecified 12/05/2018   Moderna Sars-Covid-2 Vaccination 05/17/2019, 06/14/2019, 02/13/2020   Pneumococcal Conjugate-13 11/19/2020   Pneumococcal Polysaccharide-23 12/22/2015   Tdap 06/30/2017    TDAP status: Up to date  Flu Vaccine status: Up to date  Pneumococcal vaccine status: Up to date  Covid-19 vaccine status: Completed vaccines  Screening Tests Health Maintenance  Topic Date Due   Hepatitis C Screening  Never done   Zoster Vaccines- Shingrix (1 of 2) Never done   MAMMOGRAM  03/14/2020   COVID-19 Vaccine (4 - Booster for Moderna series) 05/07/2020   Fecal DNA (Cologuard)  09/13/2020   DEXA SCAN  03/14/2021   TETANUS/TDAP  07/01/2027   INFLUENZA VACCINE  Completed   HPV VACCINES  Aged Out    Health Maintenance  Health Maintenance Due  Topic Date Due   Hepatitis C Screening  Never done   Zoster Vaccines- Shingrix (1 of 2) Never done   MAMMOGRAM  03/14/2020   COVID-19 Vaccine (4 - Booster for Moderna series) 05/07/2020   Fecal DNA (Cologuard)  09/13/2020    Colorectal cancer screening: Type of screening: Cologuard. Completed 09/13/2017. Repeat every 3 years  Mammogram status: Completed 03/15/2019. Repeat every year  Bone Density status: Ordered to be completed at [facility] after 03/14/21    Plan:    1- Screening Mammogram - scheduled for 01/06/21 2- Bone  Density Screening - to be scheduled after 03/14/21 -- next Prolia injection will be due 01/10/21 3- COVID Vaccine - received Bivalent Booster today - up to date 4- Colorectal Cancer Screening - Cologuard has been ordered.  Last done 09/13/17 5- Advance Directive - discussed benefits of having advance directive - information given to patient - she will complete and bring a notarized copy for our records 6- Patient reports no longer having to wear CPAP 7- Patient does not exercise however she lives a very active lifestyle and consumes a healthy diet.  I have personally reviewed and noted the following in the patient's chart:   Medical and social history Use of alcohol, tobacco or illicit drugs  Current medications and supplements including opioid prescriptions.  Functional ability and status Nutritional status Physical activity Advanced directives List of other physicians Hospitalizations, surgeries, and ER visits in previous 12 months Vitals Screenings to include cognitive, depression, and falls Referrals and appointments  In addition, I have reviewed and discussed with patient certain preventive protocols, quality metrics, and best practice recommendations. A written personalized care plan for preventive services as well as general preventive health recommendations were provided to patient.     Jacklynn Bue, LPN   16/12/9602

## 2020-12-30 DIAGNOSIS — J069 Acute upper respiratory infection, unspecified: Secondary | ICD-10-CM | POA: Diagnosis not present

## 2020-12-30 DIAGNOSIS — J019 Acute sinusitis, unspecified: Secondary | ICD-10-CM | POA: Diagnosis not present

## 2020-12-30 DIAGNOSIS — J301 Allergic rhinitis due to pollen: Secondary | ICD-10-CM | POA: Diagnosis not present

## 2020-12-30 DIAGNOSIS — J4530 Mild persistent asthma, uncomplicated: Secondary | ICD-10-CM | POA: Diagnosis not present

## 2021-01-01 DIAGNOSIS — Z87891 Personal history of nicotine dependence: Secondary | ICD-10-CM | POA: Diagnosis not present

## 2021-01-02 ENCOUNTER — Telehealth: Payer: Self-pay | Admitting: Family Medicine

## 2021-01-02 NOTE — Telephone Encounter (Signed)
   Ellen Holloway has been scheduled for the following appointment:  WHAT: BONE DENSITY WHERE: [facility] DATE: 03/21/21 TIME: 9:30 AM ARRIVAL TIME  Patient has been made aware.

## 2021-01-06 ENCOUNTER — Other Ambulatory Visit: Payer: Self-pay

## 2021-01-06 ENCOUNTER — Ambulatory Visit
Admission: RE | Admit: 2021-01-06 | Discharge: 2021-01-06 | Disposition: A | Discharge status: Home / Self Care | Payer: Medicare Other | Source: Ambulatory Visit | Attending: Family Medicine | Admitting: Family Medicine

## 2021-01-06 DIAGNOSIS — Z1231 Encounter for screening mammogram for malignant neoplasm of breast: Secondary | ICD-10-CM | POA: Diagnosis not present

## 2021-01-07 DIAGNOSIS — R051 Acute cough: Secondary | ICD-10-CM | POA: Diagnosis not present

## 2021-01-08 ENCOUNTER — Encounter: Payer: Self-pay | Admitting: Family Medicine

## 2021-01-09 DIAGNOSIS — R051 Acute cough: Secondary | ICD-10-CM | POA: Diagnosis not present

## 2021-01-10 ENCOUNTER — Ambulatory Visit (INDEPENDENT_AMBULATORY_CARE_PROVIDER_SITE_OTHER): Payer: Medicare Other

## 2021-01-10 DIAGNOSIS — M810 Age-related osteoporosis without current pathological fracture: Secondary | ICD-10-CM | POA: Diagnosis not present

## 2021-01-10 MED ORDER — DENOSUMAB 60 MG/ML ~~LOC~~ SOSY
60.0000 mg | PREFILLED_SYRINGE | Freq: Once | SUBCUTANEOUS | Status: AC
  Administered 2021-01-10: 60 mg via SUBCUTANEOUS

## 2021-01-11 ENCOUNTER — Other Ambulatory Visit: Payer: Self-pay | Admitting: Physician Assistant

## 2021-01-13 DIAGNOSIS — E782 Mixed hyperlipidemia: Secondary | ICD-10-CM

## 2021-01-13 DIAGNOSIS — F330 Major depressive disorder, recurrent, mild: Secondary | ICD-10-CM | POA: Diagnosis not present

## 2021-01-13 DIAGNOSIS — R918 Other nonspecific abnormal finding of lung field: Secondary | ICD-10-CM | POA: Diagnosis not present

## 2021-01-13 DIAGNOSIS — J301 Allergic rhinitis due to pollen: Secondary | ICD-10-CM | POA: Diagnosis not present

## 2021-01-13 DIAGNOSIS — J4530 Mild persistent asthma, uncomplicated: Secondary | ICD-10-CM | POA: Diagnosis not present

## 2021-01-13 DIAGNOSIS — F321 Major depressive disorder, single episode, moderate: Secondary | ICD-10-CM | POA: Diagnosis not present

## 2021-01-15 ENCOUNTER — Telehealth: Payer: Self-pay

## 2021-01-15 NOTE — Chronic Care Management (AMB) (Signed)
Chronic Care Management Pharmacy Assistant   Name: Ellen Holloway  MRN: [id number] DOB: Mar 05, 1951   Reason for Encounter: General Adherence Call    Recent office visits:  12/26/20 Ellen Hight LPN. Seen for Annual Wellness visit. Ordered Dexascan. No med changes.   Recent consult visits:  None   Hospital visits:  None   Medications: Outpatient Encounter Medications as of 01/15/2021  Medication Sig   albuterol (PROVENTIL) (2.5 MG/3ML) 0.083% nebulizer solution Take 2.5 mg by nebulization every 6 (six) hours as needed for wheezing or shortness of breath.   atorvastatin (LIPITOR) 10 MG tablet TAKE 1 TABLET BY MOUTH EVERY DAY   calcium carbonate (OSCAL) 1500 (600 Ca) MG TABS tablet Take 600 mg of elemental calcium by mouth daily.   clonazePAM (KLONOPIN) 1 MG tablet TAKE 1 TABLET BY MOUTH 2 TIMES DAILY AS NEEDED FOR ANXIETY.   cyclobenzaprine (FLEXERIL) 10 MG tablet TAKE 1 TABLET BY MOUTH EVERYDAY AT BEDTIME   famotidine (PEPCID) 40 MG tablet TAKE 1 TABLET BY MOUTH EVERYDAY AT BEDTIME   Fluticasone-Umeclidin-Vilant (TRELEGY ELLIPTA) 100-62.5-25 MCG/INH AEPB Inhale 1 puff into the lungs daily as needed.   furosemide (LASIX) 20 MG tablet TAKE 1 TABLET (20 MG TOTAL) BY MOUTH DAILY AS NEEDED. AS NEEDED FOR SWELLING   ipratropium (ATROVENT) 0.06 % nasal spray 2 sprays each nostril every 6 hours   oxyCODONE-acetaminophen (PERCOCET) 10-325 MG tablet Take 1 tablet by mouth every 6 (six) hours.   OXYGEN Inhale into the lungs. 2 L at night.   pantoprazole (PROTONIX) 40 MG tablet TAKE 1 TABLET BY MOUTH TWICE A DAY   pramipexole (MIRAPEX) 1 MG tablet TAKE 1 TABLET BY MOUTH EVERYDAY AT BEDTIME   promethazine (PHENERGAN) 25 MG tablet Take 1 tablet (25 mg total) by mouth every 6 (six) hours as needed for nausea or vomiting.   RESTASIS 0.05 % ophthalmic emulsion 1 drop 2 (two) times daily.   sertraline (ZOLOFT) 100 MG tablet TAKE 2 TABLETS BY MOUTH EVERY DAY   spironolactone (ALDACTONE) 25 MG  tablet TAKE 1 TABLET BY MOUTH EVERY DAY   VITAMIN D-VITAMIN K PO Take 1 tablet by mouth daily.   No facility-administered encounter medications on file as of 01/15/2021.    Contacted Ellen Holloway for general disease state and medication adherence call.   Patient is not > 5 days past due for refill on the following medications per chart history:  Star Medications: Pt has an over supply and has plenty on hand Medication Name/mg Last Fill Days Supply Atorvastatin 10 mg  07/28/20 90ds   What concerns do you have about your medications? Pt has no concerns  The patient denies side effects with her medications.   How often do you forget or accidentally miss a dose? Never  Do you use a pillbox? Yes  Are you having any problems getting your medications from your pharmacy? No  Has the cost of your medications been a concern? Pt has no concerns   Since last visit with CPP, no interventions have been made:   The patient has not had an ED visit since last contact.   The patient denies problems with their health. Pt stated she has not felt this good in a long time and is doing very well. She stated she has taken up painting and found her outlet. She is very appreciative of everything and says thank you to Ellen Holloway and the staff.     Care Gaps: Last annual wellness visit? 12/26/20  If applicable:N/A Last eye exam / retinopathy screening? Diabetic foot exam? Mammogram 01/06/21    Ellen Holloway, CMA Clinical Pharmacist Assistant  [phone]

## 2021-01-17 DIAGNOSIS — S3210XA Unspecified fracture of sacrum, initial encounter for closed fracture: Secondary | ICD-10-CM | POA: Diagnosis not present

## 2021-01-17 DIAGNOSIS — Z1389 Encounter for screening for other disorder: Secondary | ICD-10-CM | POA: Diagnosis not present

## 2021-01-17 DIAGNOSIS — R202 Paresthesia of skin: Secondary | ICD-10-CM | POA: Diagnosis not present

## 2021-01-17 DIAGNOSIS — M5416 Radiculopathy, lumbar region: Secondary | ICD-10-CM | POA: Diagnosis not present

## 2021-01-17 DIAGNOSIS — M47816 Spondylosis without myelopathy or radiculopathy, lumbar region: Secondary | ICD-10-CM | POA: Diagnosis not present

## 2021-01-17 DIAGNOSIS — Z79891 Long term (current) use of opiate analgesic: Secondary | ICD-10-CM | POA: Diagnosis not present

## 2021-01-17 DIAGNOSIS — G894 Chronic pain syndrome: Secondary | ICD-10-CM | POA: Diagnosis not present

## 2021-01-17 DIAGNOSIS — S22050D Wedge compression fracture of T5-T6 vertebra, subsequent encounter for fracture with routine healing: Secondary | ICD-10-CM | POA: Diagnosis not present

## 2021-01-24 ENCOUNTER — Other Ambulatory Visit: Payer: Self-pay | Admitting: Family Medicine

## 2021-01-29 DIAGNOSIS — Z1211 Encounter for screening for malignant neoplasm of colon: Secondary | ICD-10-CM | POA: Diagnosis not present

## 2021-02-04 LAB — COLOGUARD: COLOGUARD: NEGATIVE

## 2021-02-10 DIAGNOSIS — H04123 Dry eye syndrome of bilateral lacrimal glands: Secondary | ICD-10-CM | POA: Diagnosis not present

## 2021-02-10 DIAGNOSIS — H5203 Hypermetropia, bilateral: Secondary | ICD-10-CM | POA: Diagnosis not present

## 2021-02-10 DIAGNOSIS — Z961 Presence of intraocular lens: Secondary | ICD-10-CM | POA: Diagnosis not present

## 2021-02-10 DIAGNOSIS — H16223 Keratoconjunctivitis sicca, not specified as Sjogren's, bilateral: Secondary | ICD-10-CM | POA: Diagnosis not present

## 2021-02-10 DIAGNOSIS — H35372 Puckering of macula, left eye: Secondary | ICD-10-CM | POA: Diagnosis not present

## 2021-02-12 ENCOUNTER — Other Ambulatory Visit: Payer: Self-pay | Admitting: Family Medicine

## 2021-02-13 ENCOUNTER — Telehealth: Payer: Self-pay

## 2021-02-13 NOTE — Chronic Care Management (AMB) (Signed)
    Chronic Care Management Pharmacy Assistant   Name: Ellen Holloway  MRN: [id number] DOB: 08-11-1950  Reason for Encounter: General Adherence Call    Recent office visits:  None   Recent consult visits:  None  Hospital visits:  None   Medications: Outpatient Encounter Medications as of 02/13/2021  Medication Sig   albuterol (PROVENTIL) (2.5 MG/3ML) 0.083% nebulizer solution Take 2.5 mg by nebulization every 6 (six) hours as needed for wheezing or shortness of breath.   atorvastatin (LIPITOR) 10 MG tablet TAKE 1 TABLET BY MOUTH EVERY DAY   calcium carbonate (OSCAL) 1500 (600 Ca) MG TABS tablet Take 600 mg of elemental calcium by mouth daily.   clonazePAM (KLONOPIN) 1 MG tablet TAKE 1 TABLET BY MOUTH 2 TIMES DAILY AS NEEDED FOR ANXIETY.   cyclobenzaprine (FLEXERIL) 10 MG tablet TAKE 1 TABLET BY MOUTH EVERYDAY AT BEDTIME   famotidine (PEPCID) 40 MG tablet TAKE 1 TABLET BY MOUTH EVERYDAY AT BEDTIME   Fluticasone-Umeclidin-Vilant (TRELEGY ELLIPTA) 100-62.5-25 MCG/INH AEPB Inhale 1 puff into the lungs daily as needed.   furosemide (LASIX) 20 MG tablet TAKE 1 TABLET (20 MG TOTAL) BY MOUTH DAILY AS NEEDED. AS NEEDED FOR SWELLING   ipratropium (ATROVENT) 0.06 % nasal spray 2 sprays each nostril every 6 hours   oxyCODONE-acetaminophen (PERCOCET) 10-325 MG tablet Take 1 tablet by mouth every 6 (six) hours.   OXYGEN Inhale into the lungs. 2 L at night.   pantoprazole (PROTONIX) 40 MG tablet TAKE 1 TABLET BY MOUTH TWICE A DAY   pramipexole (MIRAPEX) 1 MG tablet TAKE 1 TABLET BY MOUTH EVERYDAY AT BEDTIME   promethazine (PHENERGAN) 25 MG tablet Take 1 tablet (25 mg total) by mouth every 6 (six) hours as needed for nausea or vomiting.   RESTASIS 0.05 % ophthalmic emulsion 1 drop 2 (two) times daily.   sertraline (ZOLOFT) 100 MG tablet TAKE 2 TABLETS BY MOUTH EVERY DAY   spironolactone (ALDACTONE) 25 MG tablet TAKE 1 TABLET BY MOUTH EVERY DAY   VITAMIN D-VITAMIN K PO Take 1 tablet by mouth  daily.   No facility-administered encounter medications on file as of 02/13/2021.  Contacted Ellen Holloway for general disease state and medication adherence call.   Patient is not > 5 days past due for refill on the following medications per chart history:  Star Medications: Medication Name/mg Last Fill Days Supply Atorvastatin 10 mg  07/28/20 90ds   Note: Pt stated she had an extra bottle of 90 ds on top of her other 90 day supply so she had an over supply.  What concerns do you have about your medications? Pt has no concerns   The patient denies side effects with her medications.   How often do you forget or accidentally miss a dose? Never  Do you use a pillbox? Yes   Are you having any problems getting your medications from your pharmacy? No  Has the cost of your medications been a concern? No  Since last visit with CPP, no interventions have been made:   The patient has not had an ED visit since last contact.   The patient denies problems with their health.   Pt stated she checks her BP at home and her O2 levels.  Latest reading 02/17/21 120/79   Care Gaps: Last annual wellness visit?12/26/20 Dexa Scan: 03/15/19 Mammogram: 01/06/21  Ellen Holloway, CMA Clinical Pharmacist Assistant  [phone]

## 2021-02-14 DIAGNOSIS — G894 Chronic pain syndrome: Secondary | ICD-10-CM | POA: Diagnosis not present

## 2021-02-14 DIAGNOSIS — M47816 Spondylosis without myelopathy or radiculopathy, lumbar region: Secondary | ICD-10-CM | POA: Diagnosis not present

## 2021-02-14 DIAGNOSIS — S3210XA Unspecified fracture of sacrum, initial encounter for closed fracture: Secondary | ICD-10-CM | POA: Diagnosis not present

## 2021-02-14 DIAGNOSIS — Z79891 Long term (current) use of opiate analgesic: Secondary | ICD-10-CM | POA: Diagnosis not present

## 2021-02-14 DIAGNOSIS — S22050D Wedge compression fracture of T5-T6 vertebra, subsequent encounter for fracture with routine healing: Secondary | ICD-10-CM | POA: Diagnosis not present

## 2021-02-14 DIAGNOSIS — R202 Paresthesia of skin: Secondary | ICD-10-CM | POA: Diagnosis not present

## 2021-02-14 DIAGNOSIS — M5416 Radiculopathy, lumbar region: Secondary | ICD-10-CM | POA: Diagnosis not present

## 2021-02-14 DIAGNOSIS — Z1389 Encounter for screening for other disorder: Secondary | ICD-10-CM | POA: Diagnosis not present

## 2021-02-18 NOTE — Telephone Encounter (Signed)
No DEXA in 2 years. Called and spoke with patient who said she was supposed to get it done Jan 2023. I asked her if she had an appt and she told me no. Will coordinate with PCP to get patient a DEXA by Jan-Feb 2023

## 2021-02-25 ENCOUNTER — Encounter: Payer: Self-pay | Admitting: Family Medicine

## 2021-02-25 ENCOUNTER — Other Ambulatory Visit: Payer: Self-pay

## 2021-02-25 ENCOUNTER — Ambulatory Visit (INDEPENDENT_AMBULATORY_CARE_PROVIDER_SITE_OTHER): Payer: Medicare Other | Admitting: Family Medicine

## 2021-02-25 VITALS — BP 110/64 | HR 88 | Temp 97.9°F | Resp 14 | Ht 64.0 in | Wt 113.0 lb

## 2021-02-25 DIAGNOSIS — M8000XA Age-related osteoporosis with current pathological fracture, unspecified site, initial encounter for fracture: Secondary | ICD-10-CM

## 2021-02-25 DIAGNOSIS — Z1159 Encounter for screening for other viral diseases: Secondary | ICD-10-CM | POA: Diagnosis not present

## 2021-02-25 DIAGNOSIS — J018 Other acute sinusitis: Secondary | ICD-10-CM

## 2021-02-25 DIAGNOSIS — F330 Major depressive disorder, recurrent, mild: Secondary | ICD-10-CM

## 2021-02-25 DIAGNOSIS — G2581 Restless legs syndrome: Secondary | ICD-10-CM

## 2021-02-25 DIAGNOSIS — J9611 Chronic respiratory failure with hypoxia: Secondary | ICD-10-CM | POA: Diagnosis not present

## 2021-02-25 DIAGNOSIS — E782 Mixed hyperlipidemia: Secondary | ICD-10-CM

## 2021-02-25 DIAGNOSIS — F321 Major depressive disorder, single episode, moderate: Secondary | ICD-10-CM | POA: Diagnosis not present

## 2021-02-25 DIAGNOSIS — J449 Chronic obstructive pulmonary disease, unspecified: Secondary | ICD-10-CM

## 2021-02-25 MED ORDER — CLONAZEPAM 1 MG PO TABS: 1.0000 mg | ORAL_TABLET | Freq: Two times a day (BID) | ORAL | 1 refills | Status: DC | PRN

## 2021-02-25 MED ORDER — PRAMIPEXOLE DIHYDROCHLORIDE 1 MG PO TABS: ORAL_TABLET | ORAL | 1 refills | Status: DC

## 2021-02-25 MED ORDER — AMOXICILLIN 875 MG PO TABS: 875.0000 mg | ORAL_TABLET | Freq: Two times a day (BID) | ORAL | 0 refills | Status: AC

## 2021-02-25 NOTE — Progress Notes (Signed)
Subjective:  Patient ID: Ellen Holloway, female    DOB: 1950-07-07  Age: 70 y.o. MRN: [id number]  Chief Complaint  Patient presents with   Hyperlipidemia    HPI Patient is a 70 year old white female with past medical history significant for hyperlipidemia, COPD, chronic pain syndrome, and GERD.  Hyperlipidemia: Currently on Lipitor 10 mg once daily.  Last labs done 6 months ago are within normal limits. GERD: Currently on Pepcid 20 mg once daily and Protonix 40 mg once daily..  Well-controlled.  COPD: Currently on Trelegy 1 puff daily and albuterol nebulizers 1 every 6 hours as needed for shortness of breath. Oxygen 2 L PNC. Chronic pain syndrome: Currently sees [facility] is on Percocet 10/325 4 times a day as needed.  Patient has chronic back pain related to a compression fraction.   Restless Leg syndrome currently on Mirapex 1 mg once at night which works well.  Depression with anxiety: Well-controlled on Zoloft 100 mg 2 daily.  Has clonazepam 1 mg twice daily as needed.  She has not actually filled it since May.    Current Outpatient Medications on File Prior to Visit  Medication Sig Dispense Refill   albuterol (PROVENTIL) (2.5 MG/3ML) 0.083% nebulizer solution Take 2.5 mg by nebulization every 6 (six) hours as needed for wheezing or shortness of breath.     atorvastatin (LIPITOR) 10 MG tablet TAKE 1 TABLET BY MOUTH EVERY DAY 90 tablet 1   calcium carbonate (OSCAL) 1500 (600 Ca) MG TABS tablet Take 600 mg of elemental calcium by mouth daily.     cyclobenzaprine (FLEXERIL) 10 MG tablet TAKE 1 TABLET BY MOUTH EVERYDAY AT BEDTIME 30 tablet 2   Fluticasone-Umeclidin-Vilant (TRELEGY ELLIPTA) 100-62.5-25 MCG/INH AEPB Inhale 1 puff into the lungs daily as needed.     furosemide (LASIX) 20 MG tablet TAKE 1 TABLET (20 MG TOTAL) BY MOUTH DAILY AS NEEDED. AS NEEDED FOR SWELLING 90 tablet 0   ipratropium (ATROVENT) 0.06 % nasal spray 2 sprays each nostril every 6 hours 15 mL 5    oxyCODONE-acetaminophen (PERCOCET) 10-325 MG tablet Take 1 tablet by mouth every 6 (six) hours.     OXYGEN Inhale into the lungs. 2 L at night.     pantoprazole (PROTONIX) 40 MG tablet TAKE 1 TABLET BY MOUTH TWICE A DAY 180 tablet 3   promethazine (PHENERGAN) 25 MG tablet Take 1 tablet (25 mg total) by mouth every 6 (six) hours as needed for nausea or vomiting. 30 tablet 1   VITAMIN D-VITAMIN K PO Take 1 tablet by mouth daily.     No current facility-administered medications on file prior to visit.   Past Medical History:  Diagnosis Date   Acquired absence of both cervix and uterus    Age-related osteoporosis with current pathological fracture, unspecified site, initial encounter for fracture    Asthma    Barrett's esophagus with low grade dysplasia    Chronic obstructive pulmonary disease (COPD) (HCC)    Major depressive disorder, recurrent severe without psychotic features ([place])    Mixed hyperlipidemia    Obstructive sleep apnea (adult) (pediatric)    Other obesity due to excess calories    Other pulmonary embolism without acute cor pulmonale (HCC)    Other specified anxiety disorders    Sleep related leg cramps    Telogen effluvium    Urticaria    Past Surgical History:  Procedure Laterality Date   ABDOMINAL HYSTERECTOMY  1980   partial   right wrist 1985  Family History  Problem Relation Age of Onset   Obesity Mother    Heart attack Father    Allergic rhinitis Sister    Asthma Brother    Renal Disease Brother    Social History   Socioeconomic History   Marital status: Married    Spouse name: Lanny Hurst   Number of children: 2   Years of education: Not on file   Highest education level: Not on file  Occupational History   Not on file  Tobacco Use   Smoking status: Former    Packs/day: 0.50    Years: 55.00    Pack years: 27.50    Types: E-cigarettes, Cigarettes   Smokeless tobacco: Never   Tobacco comments:    Patient used to smoke up to 2 PPD.  Substance  and Sexual Activity   Alcohol use: Yes    Alcohol/week: 1.0 standard drink    Types: 1 Cans of beer per week    Comment: occasional beer   Drug use: Never   Sexual activity: Not on file  Other Topics Concern   Not on file  Social History Narrative   Not on file   Social Determinants of Health   Financial Resource Strain: Low Risk    Difficulty of Paying Living Expenses: Not hard at all  Food Insecurity: No Food Insecurity   Worried About Charity fundraiser in the Last Year: Never true   [place] in the Last Year: Never true  Transportation Needs: No Transportation Needs   Lack of Transportation (Medical): No   Lack of Transportation (Non-Medical): No  Physical Activity: Not on file  Stress: Not on file  Social Connections: Not on file    Review of Systems  Constitutional:  Positive for fatigue. Negative for chills and fever.  HENT:  Positive for rhinorrhea. Negative for congestion and sore throat.   Eyes:  Positive for visual disturbance.  Respiratory:  Positive for cough and shortness of breath.   Cardiovascular:  Negative for chest pain.  Gastrointestinal:  Positive for diarrhea and nausea. Negative for abdominal pain, constipation and vomiting.  Genitourinary:  Negative for dysuria and urgency.  Musculoskeletal:  Positive for back pain. Negative for myalgias.  Neurological:  Negative for dizziness, weakness, light-headedness and headaches.  Psychiatric/Behavioral:  Negative for dysphoric mood. The patient is not nervous/anxious.     Objective:  BP 110/64   Pulse 88   Temp 97.9 F (36.6 C)   Resp 14   Ht 5\' 4"  (1.626 m)   Wt 113 lb (51.3 kg)   BMI 19.40 kg/m   BP/Weight 02/25/2021 [id number]  Systolic BP [id number]  Diastolic BP 64 80 62  Wt. (Lbs) 113 111.8 113  BMI 19.4 19.19 19.4    Physical Exam Vitals reviewed.  Constitutional:      Appearance: Normal appearance. She is normal weight.  HENT:     Right Ear: Tympanic membrane  normal.     Left Ear: Tympanic membrane normal.     Nose: Congestion present.     Comments: Frontal sinus tenderness     Mouth/Throat:     Pharynx: No oropharyngeal exudate or posterior oropharyngeal erythema.  Neck:     Vascular: No carotid bruit.  Cardiovascular:     Rate and Rhythm: Normal rate and regular rhythm.     Pulses: Normal pulses.     Heart sounds: Normal heart sounds.  Pulmonary:     Effort: Pulmonary effort is normal. No  respiratory distress.     Breath sounds: Normal breath sounds.  Abdominal:     General: Abdomen is flat. Bowel sounds are normal.     Palpations: Abdomen is soft.     Tenderness: There is no abdominal tenderness.  Neurological:     Mental Status: She is alert and oriented to person, place, and time.  Psychiatric:        Mood and Affect: Mood normal.        Behavior: Behavior normal.    Diabetic Foot Exam - Simple   No data filed      Lab Results  Component Value Date   WBC 6.0 11/19/2020   HGB 13.9 11/19/2020   HCT 42.2 11/19/2020   PLT 220 11/19/2020   GLUCOSE 98 02/25/2021   CHOL 153 02/25/2021   TRIG 75 02/25/2021   HDL 80 02/25/2021   LDLCALC 59 02/25/2021   ALT 17 02/25/2021   AST 21 02/25/2021   NA 140 02/25/2021   K 4.2 02/25/2021   CL 103 02/25/2021   CREATININE 0.86 02/25/2021   BUN 12 02/25/2021   CO2 22 02/25/2021   TSH 2.520 08/13/2020      Assessment & Plan:   Problem List Items Addressed This Visit       Respiratory   Chronic respiratory failure with hypoxia (HCC)    Continue oxygen at 2 L pnc.      COPD mixed type (HCC)    Currently on Trelegy 1 puff daily and albuterol nebulizers 1 every 6 hours as needed for shortness of breath. The current medical regimen is effective;  continue present plan and medications.      Acute infection of nasal sinus    Rx: amoxicillin.        Musculoskeletal and Integument   Age-related osteoporosis with current pathological fracture    The current medical regimen  is effective;  continue present plan and medications.        Other   Depression, major, recurrent, mild (HCC)    Well-controlled on Zoloft 100 mg 2 daily. The current medical regimen is effective;  continue present plan and medications.      Relevant Medications   clonazePAM (KLONOPIN) 1 MG tablet   RLS (restless legs syndrome)    The current medical regimen is effective;  continue present plan and medications.       Mixed hyperlipidemia - Primary    Well controlled.  No changes to medicines. Currently on Lipitor 10 mg once daily. Continue to work on eating a healthy diet and exercise.  Labs drawn today.       Relevant Orders   Comprehensive metabolic panel (Completed)   Lipid panel (Completed)   Need for hepatitis C screening test    Lab drawn today.      Relevant Orders   HCV Ab w Reflex to Quant PCR (Completed)  .  Meds ordered this encounter  Medications   amoxicillin (AMOXIL) 875 MG tablet    Sig: Take 1 tablet (875 mg total) by mouth 2 (two) times daily for 10 days.    Dispense:  20 tablet    Refill:  0   pramipexole (MIRAPEX) 1 MG tablet    Sig: TAKE 1 TABLET BY MOUTH EVERYDAY AT BEDTIME    Dispense:  90 tablet    Refill:  1   clonazePAM (KLONOPIN) 1 MG tablet    Sig: Take 1 tablet (1 mg total) by mouth 2 (two) times daily as needed for  anxiety.    Dispense:  30 tablet    Refill:  1    This request is for a new prescription for a controlled substance as required by Federal/State law.    Orders Placed This Encounter  Procedures   Comprehensive metabolic panel   Lipid panel   HCV Ab w Reflex to Quant PCR   Cardiovascular Risk Assessment   Interpretation:    I,Anijah Spohr,acting as a scribe for Rochel Brome, MD.,have documented all relevant documentation on the behalf of Rochel Brome, MD,as directed by  Rochel Brome, MD while in the presence of Rochel Brome, MD.   Follow-up: Return in about 6 months (around 08/26/2021) for chronic fasting.  An After Visit  Summary was printed and given to the patient.  Rochel Brome, MD [facility] [phone]

## 2021-02-26 DIAGNOSIS — Z1159 Encounter for screening for other viral diseases: Secondary | ICD-10-CM | POA: Insufficient documentation

## 2021-02-26 LAB — LIPID PANEL
Chol/HDL Ratio: 1.9 ratio (ref 0.0–4.4)
Cholesterol, Total: 153 mg/dL (ref 100–199)
HDL: 80 mg/dL (ref >39)
LDL Chol Calc (NIH): 59 mg/dL (ref 0–99)
Triglycerides: 75 mg/dL (ref 0–149)
VLDL Cholesterol Cal: 14 mg/dL (ref 5–40)

## 2021-02-26 LAB — COMPREHENSIVE METABOLIC PANEL
ALT: 17 IU/L (ref 0–32)
AST: 21 IU/L (ref 0–40)
Albumin/Globulin Ratio: 2.3 — ABNORMAL HIGH (ref 1.2–2.2)
Albumin: 4.2 g/dL (ref 3.7–4.7)
Alkaline Phosphatase: 77 IU/L (ref 44–121)
BUN/Creatinine Ratio: 14 (ref 12–28)
BUN: 12 mg/dL (ref 8–27)
Bilirubin Total: <0.2 mg/dL (ref 0.0–1.2)
CO2: 22 mmol/L (ref 20–29)
Calcium: 9 mg/dL (ref 8.7–10.3)
Chloride: 103 mmol/L (ref 96–106)
Creatinine, Ser: 0.86 mg/dL (ref 0.57–1.00)
Globulin, Total: 1.8 g/dL (ref 1.5–4.5)
Glucose: 98 mg/dL (ref 70–99)
Potassium: 4.2 mmol/L (ref 3.5–5.2)
Sodium: 140 mmol/L (ref 134–144)
Total Protein: 6 g/dL (ref 6.0–8.5)
eGFR: 72 mL/min/{1.73_m2} (ref >59)

## 2021-02-26 LAB — HCV AB W REFLEX TO QUANT PCR: HCV Ab: <0.1 s/co ratio (ref 0.0–0.9)

## 2021-02-26 NOTE — Assessment & Plan Note (Signed)
Lab drawn today

## 2021-02-26 NOTE — Assessment & Plan Note (Signed)
Well controlled.  No changes to medicines. Currently on Lipitor 10 mg once daily. Continue to work on eating a healthy diet and exercise.  Labs drawn today.

## 2021-02-26 NOTE — Assessment & Plan Note (Signed)
The current medical regimen is effective;  continue present plan and medications.  

## 2021-02-26 NOTE — Assessment & Plan Note (Signed)
Well-controlled on Zoloft 100 mg 2 daily. The current medical regimen is effective;  continue present plan and medications.

## 2021-02-26 NOTE — Assessment & Plan Note (Addendum)
Continue oxygen at 2 L pnc.

## 2021-03-08 ENCOUNTER — Other Ambulatory Visit: Payer: Self-pay | Admitting: Legal Medicine

## 2021-03-08 DIAGNOSIS — K2100 Gastro-esophageal reflux disease with esophagitis, without bleeding: Secondary | ICD-10-CM

## 2021-03-10 ENCOUNTER — Other Ambulatory Visit: Payer: Self-pay | Admitting: Legal Medicine

## 2021-03-10 ENCOUNTER — Other Ambulatory Visit: Payer: Self-pay | Admitting: Physician Assistant

## 2021-03-10 DIAGNOSIS — F321 Major depressive disorder, single episode, moderate: Secondary | ICD-10-CM

## 2021-03-17 DIAGNOSIS — J019 Acute sinusitis, unspecified: Secondary | ICD-10-CM | POA: Insufficient documentation

## 2021-03-17 DIAGNOSIS — J441 Chronic obstructive pulmonary disease with (acute) exacerbation: Secondary | ICD-10-CM | POA: Insufficient documentation

## 2021-03-17 DIAGNOSIS — J449 Chronic obstructive pulmonary disease, unspecified: Secondary | ICD-10-CM | POA: Insufficient documentation

## 2021-03-17 NOTE — Assessment & Plan Note (Signed)
Currently on Trelegy 1 puff daily and albuterol nebulizers 1 every 6 hours as needed for shortness of breath. The current medical regimen is effective;  continue present plan and medications.

## 2021-03-17 NOTE — Assessment & Plan Note (Signed)
The current medical regimen is effective;  continue present plan and medications.  

## 2021-03-17 NOTE — Assessment & Plan Note (Signed)
Rx: amoxicillin.  

## 2021-03-18 ENCOUNTER — Telehealth: Payer: Self-pay

## 2021-03-18 NOTE — Chronic Care Management (AMB) (Signed)
Chronic Care Management Pharmacy Assistant   Name: Ellen Holloway  MRN: [id number] DOB: May 16, 1950   Reason for Encounter: General Adherence Call    Recent office visits:  02/25/21 Blane Ohara MD. Seen for HLD. Started on Amoxicillin 875 mg 2 times daily.   Recent consult visits:  None   Hospital visits:  None   Medications: Outpatient Encounter Medications as of 03/18/2021  Medication Sig   albuterol (PROVENTIL) (2.5 MG/3ML) 0.083% nebulizer solution Take 2.5 mg by nebulization every 6 (six) hours as needed for wheezing or shortness of breath.   atorvastatin (LIPITOR) 10 MG tablet TAKE 1 TABLET BY MOUTH EVERY DAY   calcium carbonate (OSCAL) 1500 (600 Ca) MG TABS tablet Take 600 mg of elemental calcium by mouth daily.   clonazePAM (KLONOPIN) 1 MG tablet Take 1 tablet (1 mg total) by mouth 2 (two) times daily as needed for anxiety.   cyclobenzaprine (FLEXERIL) 10 MG tablet TAKE 1 TABLET BY MOUTH EVERYDAY AT BEDTIME   famotidine (PEPCID) 40 MG tablet TAKE 1 TABLET BY MOUTH EVERYDAY AT BEDTIME   Fluticasone-Umeclidin-Vilant (TRELEGY ELLIPTA) 100-62.5-25 MCG/INH AEPB Inhale 1 puff into the lungs daily as needed.   furosemide (LASIX) 20 MG tablet TAKE 1 TABLET (20 MG TOTAL) BY MOUTH DAILY AS NEEDED. AS NEEDED FOR SWELLING   ipratropium (ATROVENT) 0.06 % nasal spray 2 sprays each nostril every 6 hours   oxyCODONE-acetaminophen (PERCOCET) 10-325 MG tablet Take 1 tablet by mouth every 6 (six) hours.   OXYGEN Inhale into the lungs. 2 L at night.   pantoprazole (PROTONIX) 40 MG tablet TAKE 1 TABLET BY MOUTH TWICE A DAY   pramipexole (MIRAPEX) 1 MG tablet TAKE 1 TABLET BY MOUTH EVERYDAY AT BEDTIME   promethazine (PHENERGAN) 25 MG tablet Take 1 tablet (25 mg total) by mouth every 6 (six) hours as needed for nausea or vomiting.   sertraline (ZOLOFT) 100 MG tablet TAKE 2 TABLETS BY MOUTH EVERY DAY   spironolactone (ALDACTONE) 25 MG tablet TAKE 1 TABLET BY MOUTH EVERY DAY   VITAMIN D-VITAMIN  K PO Take 1 tablet by mouth daily.   No facility-administered encounter medications on file as of 03/18/2021.   Contacted [name] for general disease state and medication adherence call.   Patient is not > 5 days past due for refill on the following medications per chart history:  [facility] Medications: Medication Name/mg Last Fill Days Supply Atorvastatin 10 mg                  07/28/20            90ds       Note: Pt stated she had an extra bottle of 90 ds on top of her other 90 day supply so she had an over supply.   What concerns do you have about your medications? Pt stated no concerns   The patient denies side effects with her medications.   How often do you forget or accidentally miss a dose? Never  Do you use a pillbox? Yes  Are you having any problems getting your medications from your pharmacy? No  Has the cost of your medications been a concern? No  Since last visit with CPP, no interventions have been made:   The patient has had an ED visit since last contact.   The patient reports the following problems with their health. Pt stated she fell recently and fell on her tail bone. She stated she saw [facility] and  they took xrays to make sure she didn't fracture anything. She is still waiting for results. She stated her pain level is a 11 our of 10.  she reports the following  concerns or questions for Artelia Laroche at this time. She wants to know about this new medication to help rebuild bones called Evenity and wants more information about this and see if this will help her.   Patient states BP was 137/68 on 03/18/21.    Care Gaps: Last annual wellness visit?12/26/20 If applicable:N/A Last eye exam / retinopathy screening? Diabetic foot exam? Dexa Scan: Ordered and scheduled on 03/21/21 Mammogram: 01/06/21   [name], CMA Clinical Pharmacist Assistant  [phone]

## 2021-03-20 NOTE — Telephone Encounter (Signed)
Patient saw ad on TV for Evenity and wants to know why she's on Prolia when she could be using Evenity. Tried to explain they do similar things.   Patient wants me to reach out to PCP and ask about changing to [place].

## 2021-03-21 ENCOUNTER — Telehealth: Payer: Self-pay

## 2021-03-21 LAB — HM DEXA SCAN

## 2021-03-21 NOTE — Telephone Encounter (Signed)
Did research on Evenity options. Coordinated with Pharmacy and CMA staff as well. Will wait for response before proceeding

## 2021-03-21 NOTE — Chronic Care Management (AMB) (Signed)
° ° °  Chronic Care Management Pharmacy Assistant   Name: Ellen Holloway  MRN: [id number] DOB: 1950/10/11  Reason for Encounter: Patient Assistance Coordination  03/21/2021- Patient assistance application attempted filled out for Evenity with [employer], application not available, called [employer] assist, per recording Osteoporosis medications are no longer eligible for assistance, suggested foundations for grants- [employer], PAN, or Good days foundation. Spoke with representative Thayer Ohm, he stated that if patient has Medicare part B they will not be able to help with assistance or savings until 2 denials from insurance. He also referred me to grant foundations listed.  Checked [employer], [employer] foundation and [employer] days [employer], neither have any assistance for Osteoporosis. Will work on Prior authorization for [employer] through [employer]. PA sent to [facility] plan, awaiting determination.   Evenity approved through [employer] PA Case ID: [id number]- This drug has been approved under the Member's Medicare Part D benefit. Approved quantity: 1 ML per 30 day(s). You may fill up to a 90 day supply except for those on Specialty Tier 5, which can be filled up to a 30 day supply. Please call the pharmacy to process the prescription claim.  03/25/2021- After meeting with Prolia representative, this approval was through pharmacy services is void,needs to go through patient's medical services. Created patient account in [employer] Portal. Benefit Verification in progress.     Medications: Outpatient Encounter Medications as of 03/21/2021  Medication Sig   albuterol (PROVENTIL) (2.5 MG/3ML) 0.083% nebulizer solution Take 2.5 mg by nebulization every 6 (six) hours as needed for wheezing or shortness of breath.   atorvastatin (LIPITOR) 10 MG tablet TAKE 1 TABLET BY MOUTH EVERY DAY   calcium carbonate (OSCAL) 1500 (600 Ca) MG TABS tablet Take 600 mg of elemental calcium by mouth daily.    clonazePAM (KLONOPIN) 1 MG tablet Take 1 tablet (1 mg total) by mouth 2 (two) times daily as needed for anxiety.   cyclobenzaprine (FLEXERIL) 10 MG tablet TAKE 1 TABLET BY MOUTH EVERYDAY AT BEDTIME   famotidine (PEPCID) 40 MG tablet TAKE 1 TABLET BY MOUTH EVERYDAY AT BEDTIME   Fluticasone-Umeclidin-Vilant (TRELEGY ELLIPTA) 100-62.5-25 MCG/INH AEPB Inhale 1 puff into the lungs daily as needed.   furosemide (LASIX) 20 MG tablet TAKE 1 TABLET (20 MG TOTAL) BY MOUTH DAILY AS NEEDED. AS NEEDED FOR SWELLING   ipratropium (ATROVENT) 0.06 % nasal spray 2 sprays each nostril every 6 hours   oxyCODONE-acetaminophen (PERCOCET) 10-325 MG tablet Take 1 tablet by mouth every 6 (six) hours.   OXYGEN Inhale into the lungs. 2 L at night.   pantoprazole (PROTONIX) 40 MG tablet TAKE 1 TABLET BY MOUTH TWICE A DAY   pramipexole (MIRAPEX) 1 MG tablet TAKE 1 TABLET BY MOUTH EVERYDAY AT BEDTIME   promethazine (PHENERGAN) 25 MG tablet Take 1 tablet (25 mg total) by mouth every 6 (six) hours as needed for nausea or vomiting.   sertraline (ZOLOFT) 100 MG tablet TAKE 2 TABLETS BY MOUTH EVERY DAY   spironolactone (ALDACTONE) 25 MG tablet TAKE 1 TABLET BY MOUTH EVERY DAY   VITAMIN D-VITAMIN K PO Take 1 tablet by mouth daily.   No facility-administered encounter medications on file as of 03/21/2021.   Billee Cashing, CMA Clinical Pharmacist Assistant [phone]

## 2021-03-24 ENCOUNTER — Encounter: Payer: Self-pay | Admitting: Family Medicine

## 2021-03-31 NOTE — Telephone Encounter (Signed)
Coordinated with Lysle Dingwall to see update on this, benefit verification still in process. Will f/u at later date

## 2021-04-07 ENCOUNTER — Ambulatory Visit (INDEPENDENT_AMBULATORY_CARE_PROVIDER_SITE_OTHER): Payer: Medicare Other | Admitting: Family Medicine

## 2021-04-07 ENCOUNTER — Encounter: Payer: Self-pay | Admitting: Family Medicine

## 2021-04-07 ENCOUNTER — Other Ambulatory Visit: Payer: Self-pay

## 2021-04-07 VITALS — BP 130/70 | HR 100 | Temp 97.3°F | Resp 16 | Ht 63.5 in | Wt 112.0 lb

## 2021-04-07 DIAGNOSIS — N3001 Acute cystitis with hematuria: Secondary | ICD-10-CM | POA: Diagnosis not present

## 2021-04-07 LAB — POCT URINALYSIS DIPSTICK
Glucose, UA: POSITIVE — AB
Ketones, UA: NEGATIVE
Nitrite, UA: POSITIVE
Protein, UA: POSITIVE — AB
Spec Grav, UA: 1.025 (ref 1.010–1.025)
Urobilinogen, UA: 4 E.U./dL — AB
pH, UA: 5 (ref 5.0–8.0)

## 2021-04-07 MED ORDER — NITROFURANTOIN MONOHYD MACRO 100 MG PO CAPS: 100.0000 mg | ORAL_CAPSULE | Freq: Two times a day (BID) | ORAL | 0 refills | Status: DC

## 2021-04-07 NOTE — Progress Notes (Signed)
Acute Office Visit  Subjective:    Patient ID: Ellen Holloway, female    DOB: 1950/08/30, 71 y.o.   MRN: [id number]  Chief Complaint  Patient presents with   Dysuria    HPI: Patient is in today for dysuria, increased frequency x 3 days. Started azo which helped. Bladder pressure. Denies nausea.  Past Medical History:  Diagnosis Date   Acquired absence of both cervix and uterus    Age-related osteoporosis with current pathological fracture, unspecified site, initial encounter for fracture    Asthma    Barrett's esophagus with low grade dysplasia    Chronic obstructive pulmonary disease (COPD) (HCC)    Major depressive disorder, recurrent severe without psychotic features (HCC)    Mixed hyperlipidemia    Obstructive sleep apnea (adult) (pediatric)    Other obesity due to excess calories    Other pulmonary embolism without acute cor pulmonale (HCC)    Other specified anxiety disorders    Sleep related leg cramps    Telogen effluvium    Urticaria     Past Surgical History:  Procedure Laterality Date   ABDOMINAL HYSTERECTOMY  1980   partial   right wrist 1985      Family History  Problem Relation Age of Onset   Obesity Mother    Heart attack Father    Allergic rhinitis Sister    Asthma Brother    Renal Disease Brother     Social History   Socioeconomic History   Marital status: Married    Spouse name: Lanny Hurst   Number of children: 2   Years of education: Not on file   Highest education level: Not on file  Occupational History   Not on file  Tobacco Use   Smoking status: Former    Packs/day: 0.50    Years: 55.00    Pack years: 27.50    Types: E-cigarettes, Cigarettes   Smokeless tobacco: Never   Tobacco comments:    Patient used to smoke up to 2 PPD.  Substance and Sexual Activity   Alcohol use: Yes    Alcohol/week: 1.0 standard drink    Types: 1 Cans of beer per week    Comment: occasional beer   Drug use: Never   Sexual activity: Not on file   Other Topics Concern   Not on file  Social History Narrative   Not on file   Social Determinants of Health   Financial Resource Strain: Low Risk    Difficulty of Paying Living Expenses: Not hard at all  Food Insecurity: No Food Insecurity   Worried About Charity fundraiser in the Last Year: Never true   [place] in the Last Year: Never true  Transportation Needs: No Transportation Needs   Lack of Transportation (Medical): No   Lack of Transportation (Non-Medical): No  Physical Activity: Not on file  Stress: Not on file  Social Connections: Not on file  Intimate Partner Violence: Not on file    Outpatient Medications Prior to Visit  Medication Sig Dispense Refill   albuterol (PROVENTIL) (2.5 MG/3ML) 0.083% nebulizer solution Take 2.5 mg by nebulization every 6 (six) hours as needed for wheezing or shortness of breath.     atorvastatin (LIPITOR) 10 MG tablet TAKE 1 TABLET BY MOUTH EVERY DAY 90 tablet 1   calcium carbonate (OSCAL) 1500 (600 Ca) MG TABS tablet Take 600 mg of elemental calcium by mouth daily.     clonazePAM (KLONOPIN) 1 MG tablet Take 1  tablet (1 mg total) by mouth 2 (two) times daily as needed for anxiety. 30 tablet 1   cyclobenzaprine (FLEXERIL) 10 MG tablet TAKE 1 TABLET BY MOUTH EVERYDAY AT BEDTIME 30 tablet 2   famotidine (PEPCID) 40 MG tablet TAKE 1 TABLET BY MOUTH EVERYDAY AT BEDTIME 90 tablet 2   Fluticasone-Umeclidin-Vilant (TRELEGY ELLIPTA) 100-62.5-25 MCG/INH AEPB Inhale 1 puff into the lungs daily as needed.     furosemide (LASIX) 20 MG tablet TAKE 1 TABLET (20 MG TOTAL) BY MOUTH DAILY AS NEEDED. AS NEEDED FOR SWELLING 90 tablet 0   ipratropium (ATROVENT) 0.06 % nasal spray 2 sprays each nostril every 6 hours 15 mL 5   oxyCODONE-acetaminophen (PERCOCET) 10-325 MG tablet Take 1 tablet by mouth every 6 (six) hours.     OXYGEN Inhale into the lungs. 2 L at night.     pantoprazole (PROTONIX) 40 MG tablet TAKE 1 TABLET BY MOUTH TWICE A DAY 180 tablet 3    pramipexole (MIRAPEX) 1 MG tablet TAKE 1 TABLET BY MOUTH EVERYDAY AT BEDTIME 90 tablet 1   promethazine (PHENERGAN) 25 MG tablet Take 1 tablet (25 mg total) by mouth every 6 (six) hours as needed for nausea or vomiting. 30 tablet 1   sertraline (ZOLOFT) 100 MG tablet TAKE 2 TABLETS BY MOUTH EVERY DAY 180 tablet 1   spironolactone (ALDACTONE) 25 MG tablet TAKE 1 TABLET BY MOUTH EVERY DAY 90 tablet 1   VITAMIN D-VITAMIN K PO Take 1 tablet by mouth daily.     No facility-administered medications prior to visit.    Allergies  Allergen Reactions   Sulfamethoxazole Anaphylaxis   Sulfa Antibiotics Hives    Review of Systems  Constitutional:  Positive for chills and fatigue. Negative for fever.  Respiratory:  Negative for cough.   Cardiovascular:  Negative for chest pain.  Gastrointestinal:  Negative for nausea.  Genitourinary:  Positive for dysuria, frequency, pelvic pain and urgency.  Musculoskeletal:  Positive for back pain.  Neurological:  Positive for headaches.      Objective:    Physical Exam Vitals reviewed.  Constitutional:      Appearance: Normal appearance.  Cardiovascular:     Rate and Rhythm: Normal rate and regular rhythm.     Heart sounds: Normal heart sounds.  Pulmonary:     Effort: Pulmonary effort is normal.     Breath sounds: Normal breath sounds.  Abdominal:     General: Abdomen is flat.     Palpations: Abdomen is soft.     Tenderness: There is no abdominal tenderness.  Neurological:     Mental Status: She is alert.    BP 130/70    Pulse 100    Temp (!) 97.3 F (36.3 C)    Resp 16    Ht 5' 3.5" (1.613 m)    Wt 112 lb (50.8 kg)    BMI 19.53 kg/m  Wt Readings from Last 3 Encounters:  04/07/21 112 lb (50.8 kg)  02/25/21 113 lb (51.3 kg)  12/26/20 111 lb 12.8 oz (50.7 kg)    Health Maintenance Due  Topic Date Due   Zoster Vaccines- Shingrix (1 of 2) Never done   DEXA SCAN  03/14/2021    There are no preventive care reminders to display for this  patient.   Lab Results  Component Value Date   TSH 2.520 08/13/2020   Lab Results  Component Value Date   WBC 6.0 11/19/2020   HGB 13.9 11/19/2020   HCT 42.2 11/19/2020  MCV 92 11/19/2020   PLT 220 11/19/2020   Lab Results  Component Value Date   NA 140 02/25/2021   K 4.2 02/25/2021   CO2 22 02/25/2021   GLUCOSE 98 02/25/2021   BUN 12 02/25/2021   CREATININE 0.86 02/25/2021   BILITOT <0.2 02/25/2021   ALKPHOS 77 02/25/2021   AST 21 02/25/2021   ALT 17 02/25/2021   PROT 6.0 02/25/2021   ALBUMIN 4.2 02/25/2021   CALCIUM 9.0 02/25/2021   EGFR 72 02/25/2021   Lab Results  Component Value Date   CHOL 153 02/25/2021   Lab Results  Component Value Date   HDL 80 02/25/2021   Lab Results  Component Value Date   LDLCALC 59 02/25/2021   Lab Results  Component Value Date   TRIG 75 02/25/2021   Lab Results  Component Value Date   CHOLHDL 1.9 02/25/2021   No results found for: HGBA1C     Assessment & Plan:   Problem List Items Addressed This Visit   None Visit Diagnoses     Acute cystitis with hematuria    -  Primary   Relevant Medications   nitrofurantoin, macrocrystal-monohydrate, (MACROBID) 100 MG capsule   Other Relevant Orders   POCT urinalysis dipstick (Completed)   Urine Culture      Meds ordered this encounter  Medications   nitrofurantoin, macrocrystal-monohydrate, (MACROBID) 100 MG capsule    Sig: Take 1 capsule (100 mg total) by mouth 2 (two) times daily.    Dispense:  14 capsule    Refill:  0    Orders Placed This Encounter  Procedures   Urine Culture   POCT urinalysis dipstick     Follow-up: Return if symptoms worsen or fail to improve.  An After Visit Summary was printed and given to the patient.  Rochel Brome, MD [facility] [phone]

## 2021-04-09 LAB — URINE CULTURE

## 2021-04-10 ENCOUNTER — Telehealth: Payer: Self-pay | Admitting: *Deleted

## 2021-04-10 ENCOUNTER — Telehealth: Payer: Medicare Other | Admitting: Physician Assistant

## 2021-04-10 ENCOUNTER — Other Ambulatory Visit: Payer: Self-pay | Admitting: Family Medicine

## 2021-04-10 DIAGNOSIS — U071 COVID-19: Secondary | ICD-10-CM

## 2021-04-10 MED ORDER — ALBUTEROL SULFATE (2.5 MG/3ML) 0.083% IN NEBU: 2.5000 mg | INHALATION_SOLUTION | Freq: Four times a day (QID) | RESPIRATORY_TRACT | 1 refills | Status: DC | PRN

## 2021-04-10 MED ORDER — ALBUTEROL SULFATE HFA 108 (90 BASE) MCG/ACT IN AERS: 2.0000 | INHALATION_SPRAY | Freq: Four times a day (QID) | RESPIRATORY_TRACT | 0 refills | Status: DC | PRN

## 2021-04-10 MED ORDER — NIRMATRELVIR/RITONAVIR (PAXLOVID)TABLET: 3.0000 | ORAL_TABLET | Freq: Two times a day (BID) | ORAL | 0 refills | Status: AC

## 2021-04-10 MED ORDER — BENZONATATE 100 MG PO CAPS: 100.0000 mg | ORAL_CAPSULE | Freq: Three times a day (TID) | ORAL | 0 refills | Status: DC | PRN

## 2021-04-10 NOTE — Progress Notes (Signed)
Virtual Visit Consent   Ellen Holloway, you are scheduled for a virtual visit with a [facility] provider today.     Just as with appointments in the office, your consent must be obtained to participate.  Your consent will be active for this visit and any virtual visit you may have with one of our providers in the next 365 days.     If you have a MyChart account, a copy of this consent can be sent to you electronically.  All virtual visits are billed to your insurance company just like a traditional visit in the office.    As this is a virtual visit, video technology does not allow for your provider to perform a traditional examination.  This may limit your provider's ability to fully assess your condition.  If your provider identifies any concerns that need to be evaluated in person or the need to arrange testing (such as labs, EKG, etc.), we will make arrangements to do so.     Although advances in technology are sophisticated, we cannot ensure that it will always work on either your end or our end.  If the connection with a video visit is poor, the visit may have to be switched to a telephone visit.  With either a video or telephone visit, we are not always able to ensure that we have a secure connection.     I need to obtain your verbal consent now.   Are you willing to proceed with your visit today?    [name] has provided verbal consent on 04/10/2021 for a virtual visit (video or telephone).   Ellen Holloway, [place]   Date: 04/10/2021 7:57 AM   Virtual Visit via Video Note   I, Ellen Holloway, connected with  Ellen Holloway  ([id number], 1951/03/07) on 04/10/21 at  7:45 AM EST by a video-enabled telemedicine application and verified that I am speaking with the correct person using two identifiers.  Location: Patient: Virtual Visit Location Patient: Home Provider: Virtual Visit Location Provider: Home Office   I discussed the limitations of evaluation and management  by telemedicine and the availability of in person appointments. The patient expressed understanding and agreed to proceed.    History of Present Illness: Ellen Holloway is a 71 y.o. who identifies as a female who was assigned female at birth, and is being seen today for COVID-19. Notes symptoms starting yesterday with headache, cough -- sometimes dry and others productive, rhinorrhea, some mild wheezing, fatigue. Denies high fever, chills, aches, chest pain, GI symptoms. Husband with similar symptoms starting 2 days ago and testing positive yesterday. She took a test yesterday that was negative. Retest this AM was quickly positive. She has history of COPD with chronic respiratory failure. [name]  HPI: HPI  Problems:  Patient Active Problem List   Diagnosis Date Noted   COPD mixed type (HCC) 03/17/2021   Acute infection of nasal sinus 03/17/2021   Need for hepatitis C screening test 02/26/2021   Chronic pain syndrome 11/19/2020   Arthralgia of both hands 11/19/2020   Encounter for screening mammogram for malignant neoplasm of breast 11/19/2020   Chronic respiratory failure with hypoxia (HCC) 11/19/2020   Mixed hyperlipidemia 11/24/2019   Seasonal allergic rhinitis due to pollen 08/27/2019   RLS (restless legs syndrome) 08/27/2019   Chronic midline low back pain without sciatica 08/27/2019   Age-related osteoporosis with current pathological fracture 04/30/2019   Depression, major, recurrent, mild (HCC) 04/27/2019  Allergies:  Allergies  Allergen Reactions   Sulfamethoxazole Anaphylaxis   Sulfa Antibiotics Hives   Medications:  Current Outpatient Medications:    albuterol (VENTOLIN HFA) 108 (90 Base) MCG/ACT inhaler, Inhale 2 puffs into the lungs every 6 (six) hours as needed for wheezing or shortness of breath., Disp: 8 g, Rfl: 0   benzonatate (TESSALON) 100 MG capsule, Take 1 capsule (100 mg total) by mouth 3 (three) times daily as needed for cough., Disp: 30 capsule, Rfl: 0    nirmatrelvir/ritonavir EUA (PAXLOVID) 20 x 150 MG & 10 x 100MG  TABS, Take 3 tablets by mouth 2 (two) times daily for 5 days. (Take nirmatrelvir 150 mg two tablets twice daily for 5 days and ritonavir 100 mg one tablet twice daily for 5 days) Patient GFR is 72, Disp: 30 tablet, Rfl: 0   albuterol (PROVENTIL) (2.5 MG/3ML) 0.083% nebulizer solution, Take 2.5 mg by nebulization every 6 (six) hours as needed for wheezing or shortness of breath., Disp: , Rfl:    atorvastatin (LIPITOR) 10 MG tablet, TAKE 1 TABLET BY MOUTH EVERY DAY, Disp: 90 tablet, Rfl: 1   calcium carbonate (OSCAL) 1500 (600 Ca) MG TABS tablet, Take 600 mg of elemental calcium by mouth daily., Disp: , Rfl:    clonazePAM (KLONOPIN) 1 MG tablet, Take 1 tablet (1 mg total) by mouth 2 (two) times daily as needed for anxiety., Disp: 30 tablet, Rfl: 1   cyclobenzaprine (FLEXERIL) 10 MG tablet, TAKE 1 TABLET BY MOUTH EVERYDAY AT BEDTIME, Disp: 30 tablet, Rfl: 2   famotidine (PEPCID) 40 MG tablet, TAKE 1 TABLET BY MOUTH EVERYDAY AT BEDTIME, Disp: 90 tablet, Rfl: 2   Fluticasone-Umeclidin-Vilant (TRELEGY ELLIPTA) 100-62.5-25 MCG/INH AEPB, Inhale 1 puff into the lungs daily as needed., Disp: , Rfl:    furosemide (LASIX) 20 MG tablet, TAKE 1 TABLET (20 MG TOTAL) BY MOUTH DAILY AS NEEDED. AS NEEDED FOR SWELLING, Disp: 90 tablet, Rfl: 0   ipratropium (ATROVENT) 0.06 % nasal spray, 2 sprays each nostril every 6 hours, Disp: 15 mL, Rfl: 5   nitrofurantoin, macrocrystal-monohydrate, (MACROBID) 100 MG capsule, Take 1 capsule (100 mg total) by mouth 2 (two) times daily., Disp: 14 capsule, Rfl: 0   oxyCODONE-acetaminophen (PERCOCET) 10-325 MG tablet, Take 1 tablet by mouth every 6 (six) hours., Disp: , Rfl:    OXYGEN, Inhale into the lungs. 2 L at night., Disp: , Rfl:    pantoprazole (PROTONIX) 40 MG tablet, TAKE 1 TABLET BY MOUTH TWICE A DAY, Disp: 180 tablet, Rfl: 3   pramipexole (MIRAPEX) 1 MG tablet, TAKE 1 TABLET BY MOUTH EVERYDAY AT BEDTIME, Disp: 90  tablet, Rfl: 1   promethazine (PHENERGAN) 25 MG tablet, Take 1 tablet (25 mg total) by mouth every 6 (six) hours as needed for nausea or vomiting., Disp: 30 tablet, Rfl: 1   sertraline (ZOLOFT) 100 MG tablet, TAKE 2 TABLETS BY MOUTH EVERY DAY, Disp: 180 tablet, Rfl: 1   spironolactone (ALDACTONE) 25 MG tablet, TAKE 1 TABLET BY MOUTH EVERY DAY, Disp: 90 tablet, Rfl: 1   VITAMIN D-VITAMIN K PO, Take 1 tablet by mouth daily., Disp: , Rfl:   Observations/Objective: No labored breathing. Speech is clear and coherent with logical content.  Patient is alert and oriented at baseline.   Assessment and Plan: 1. COVID-19 - nirmatrelvir/ritonavir EUA (PAXLOVID) 20 x 150 MG & 10 x 100MG  TABS; Take 3 tablets by mouth 2 (two) times daily for 5 days. (Take nirmatrelvir 150 mg two tablets twice daily for 5 days  and ritonavir 100 mg one tablet twice daily for 5 days) Patient GFR is 72  Dispense: 30 tablet; Refill: 0 - MyChart COVID-19 home monitoring program; Future - benzonatate (TESSALON) 100 MG capsule; Take 1 capsule (100 mg total) by mouth 3 (three) times daily as needed for cough.  Dispense: 30 capsule; Refill: 0 - albuterol (VENTOLIN HFA) 108 (90 Base) MCG/ACT inhaler; Inhale 2 puffs into the lungs every 6 (six) hours as needed for wheezing or shortness of breath.  Dispense: 8 g; Refill: 0  Patient with multiple risk factors for complicated course of illness. Discussed risks/benefits of antiviral medications including most common potential ADRs. Patient voiced understanding and would like to proceed with antiviral medication. They are candidate for paxlovid with recent normal Cr and GFR > 60 (72). She took last dose of Atorvastatin yesterday morning. She is to refrain from taking this while on antiviral and for 5 days after completion. She is also to hold her pain medication (takes PRN) while on antiviral. Rx sent to pharmacy. Supportive measures, OTC medications and vitamin regimen reviewed. Tessalon and  Albuterol per orders. Patient has been enrolled in a MyChart COVID symptom monitoring program. Anne Shutter reviewed in detail. Strict ER precautions discussed with patient.    Follow Up Instructions: I discussed the assessment and treatment plan with the patient. The patient was provided an opportunity to ask questions and all were answered. The patient agreed with the plan and demonstrated an understanding of the instructions.  A copy of instructions were sent to the patient via MyChart unless otherwise noted below.   The patient was advised to call back or seek an in-person evaluation if the symptoms worsen or if the condition fails to improve as anticipated.  Time:  I spent 15 minutes with the patient via telehealth technology discussing the above problems/concerns.    Ellen Climes, PA-C

## 2021-04-10 NOTE — Patient Instructions (Addendum)
[name], thank you for joining [name], PA-C for today's virtual visit.  While this provider is not your primary care provider (PCP), if your PCP is located in our provider database this encounter information will be shared with them immediately following your visit.  Consent: (Patient) Ellen Holloway provided verbal consent for this virtual visit at the beginning of the encounter.  Current Medications:  Current Outpatient Medications:    albuterol (PROVENTIL) (2.5 MG/3ML) 0.083% nebulizer solution, Take 2.5 mg by nebulization every 6 (six) hours as needed for wheezing or shortness of breath., Disp: , Rfl:    atorvastatin (LIPITOR) 10 MG tablet, TAKE 1 TABLET BY MOUTH EVERY DAY, Disp: 90 tablet, Rfl: 1   calcium carbonate (OSCAL) 1500 (600 Ca) MG TABS tablet, Take 600 mg of elemental calcium by mouth daily., Disp: , Rfl:    clonazePAM (KLONOPIN) 1 MG tablet, Take 1 tablet (1 mg total) by mouth 2 (two) times daily as needed for anxiety., Disp: 30 tablet, Rfl: 1   cyclobenzaprine (FLEXERIL) 10 MG tablet, TAKE 1 TABLET BY MOUTH EVERYDAY AT BEDTIME, Disp: 30 tablet, Rfl: 2   famotidine (PEPCID) 40 MG tablet, TAKE 1 TABLET BY MOUTH EVERYDAY AT BEDTIME, Disp: 90 tablet, Rfl: 2   Fluticasone-Umeclidin-Vilant (TRELEGY ELLIPTA) 100-62.5-25 MCG/INH AEPB, Inhale 1 puff into the lungs daily as needed., Disp: , Rfl:    furosemide (LASIX) 20 MG tablet, TAKE 1 TABLET (20 MG TOTAL) BY MOUTH DAILY AS NEEDED. AS NEEDED FOR SWELLING, Disp: 90 tablet, Rfl: 0   ipratropium (ATROVENT) 0.06 % nasal spray, 2 sprays each nostril every 6 hours, Disp: 15 mL, Rfl: 5   nitrofurantoin, macrocrystal-monohydrate, (MACROBID) 100 MG capsule, Take 1 capsule (100 mg total) by mouth 2 (two) times daily., Disp: 14 capsule, Rfl: 0   oxyCODONE-acetaminophen (PERCOCET) 10-325 MG tablet, Take 1 tablet by mouth every 6 (six) hours., Disp: , Rfl:    OXYGEN, Inhale into the lungs. 2 L at night., Disp: , Rfl:     pantoprazole (PROTONIX) 40 MG tablet, TAKE 1 TABLET BY MOUTH TWICE A DAY, Disp: 180 tablet, Rfl: 3   pramipexole (MIRAPEX) 1 MG tablet, TAKE 1 TABLET BY MOUTH EVERYDAY AT BEDTIME, Disp: 90 tablet, Rfl: 1   promethazine (PHENERGAN) 25 MG tablet, Take 1 tablet (25 mg total) by mouth every 6 (six) hours as needed for nausea or vomiting., Disp: 30 tablet, Rfl: 1   sertraline (ZOLOFT) 100 MG tablet, TAKE 2 TABLETS BY MOUTH EVERY DAY, Disp: 180 tablet, Rfl: 1   spironolactone (ALDACTONE) 25 MG tablet, TAKE 1 TABLET BY MOUTH EVERY DAY, Disp: 90 tablet, Rfl: 1   VITAMIN D-VITAMIN K PO, Take 1 tablet by mouth daily., Disp: , Rfl:    Medications ordered in this encounter:  No orders of the defined types were placed in this encounter.    *If you need refills on other medications prior to your next appointment, please contact your pharmacy*  Follow-Up: Call back or seek an in-person evaluation if the symptoms worsen or if the condition fails to improve as anticipated.  Other Instructions Please keep well-hydrated and get plenty of rest. Start a saline nasal rinse to flush out your nasal passages. You can use plain Mucinex to help thin congestion. If you have a humidifier, running in the bedroom at night. I want you to start OTC vitamin D3 1000 units daily, vitamin C 1000 mg daily, and a zinc supplement. Please take prescribed medications as directed.  With the Paxlovid,  you need to refrain from taking your cholesterol medication (atorvastatin) while on the Paxlovid and for 5 days after. You should also avoid use of your as-needed pain medication while on the antiviral.  You have been enrolled in a MyChart symptom monitoring program. Please answer these questions daily so we can keep track of how you are doing.  You were to quarantine for 5 days from onset of your symptoms.  After day 5, if you have had no fever and you are feeling better, you can end quarantine but need to mask for an additional 5  days. After day 5 if you have a fever or are having significant symptoms, please quarantine for full 10 days.  If you note any worsening of symptoms, any significant shortness of breath or any chest pain, please seek ER evaluation ASAP.  Please do not delay care!  COVID-19: What to Do if You Are Sick If you test positive and are an older adult or someone who is at high risk of getting very sick from COVID-19, treatment may be available. Contact a healthcare provider right away after a positive test to determine if you are eligible, even if your symptoms are mild right now. You can also visit a Test to Treat location and, if eligible, receive a prescription from a provider. Don't delay: Treatment must be started within the first few days to be effective. If you have a fever, cough, or other symptoms, you might have COVID-19. Most people have mild illness and are able to recover at home. If you are sick: Keep track of your symptoms. If you have an emergency warning sign (including trouble breathing), call 911. Steps to help prevent the spread of COVID-19 if you are sick If you are sick with COVID-19 or think you might have COVID-19, follow the steps below to care for yourself and to help protect other people in your home and community. Stay home except to get medical care Stay home. Most people with COVID-19 have mild illness and can recover at home without medical care. Do not leave your home, except to get medical care. Do not visit public areas and do not go to places where you are unable to wear a mask. Take care of yourself. Get rest and stay hydrated. Take over-the-counter medicines, such as acetaminophen, to help you feel better. Stay in touch with your doctor. Call before you get medical care. Be sure to get care if you have trouble breathing, or have any other emergency warning signs, or if you think it is an emergency. Avoid public transportation, ride-sharing, or taxis if possible. Get  tested If you have symptoms of COVID-19, get tested. While waiting for test results, stay away from others, including staying apart from those living in your household. Get tested as soon as possible after your symptoms start. Treatments may be available for people with COVID-19 who are at risk for becoming very sick. Don't delay: Treatment must be started early to be effective--some treatments must begin within 5 days of your first symptoms. Contact your healthcare provider right away if your test result is positive to determine if you are eligible. Self-tests are one of several options for testing for the virus that causes COVID-19 and may be more convenient than laboratory-based tests and point-of-care tests. Ask your healthcare provider or your local health department if you need help interpreting your test results. You can visit your state, tribal, local, and territorial health department's website to look for the latest local information  on testing sites. Separate yourself from other people As much as possible, stay in a specific room and away from other people and pets in your home. If possible, you should use a separate bathroom. If you need to be around other people or animals in or outside of the home, wear a well-fitting mask. Tell your close contacts that they may have been exposed to COVID-19. An infected person can spread COVID-19 starting 48 hours (or 2 days) before the person has any symptoms or tests positive. By letting your close contacts know they may have been exposed to COVID-19, you are helping to protect everyone. See COVID-19 and Animals if you have questions about pets. If you are diagnosed with COVID-19, someone from the health department may call you. Answer the call to slow the spread. Monitor your symptoms Symptoms of COVID-19 include fever, cough, or other symptoms. Follow care instructions from your healthcare provider and local health department. Your local health  authorities may give instructions on checking your symptoms and reporting information. When to seek emergency medical attention Look for emergency warning signs* for COVID-19. If someone is showing any of these signs, seek emergency medical care immediately: Trouble breathing Persistent pain or pressure in the chest New confusion Inability to wake or stay awake Pale, gray, or blue-colored skin, lips, or nail beds, depending on skin tone *This list is not all possible symptoms. Please call your medical provider for any other symptoms that are severe or concerning to you. Call 911 or call ahead to your local emergency facility: Notify the operator that you are seeking care for someone who has or may have COVID-19. Call ahead before visiting your doctor Call ahead. Many medical visits for routine care are being postponed or done by phone or telemedicine. If you have a medical appointment that cannot be postponed, call your doctor's office, and tell them you have or may have COVID-19. This will help the office protect themselves and other patients. If you are sick, wear a well-fitting mask You should wear a mask if you must be around other people or animals, including pets (even at home). Wear a mask with the best fit, protection, and comfort for you. You don't need to wear the mask if you are alone. If you can't put on a mask (because of trouble breathing, for example), cover your coughs and sneezes in some other way. Try to stay at least 6 feet away from other people. This will help protect the people around you. Masks should not be placed on young children under age 46 years, anyone who has trouble breathing, or anyone who is not able to remove the mask without help. Cover your coughs and sneezes Cover your mouth and nose with a tissue when you cough or sneeze. Throw away used tissues in a lined trash can. Immediately wash your hands with soap and water for at least 20 seconds. If soap and water  are not available, clean your hands with an alcohol-based hand sanitizer that contains at least 60% alcohol. Clean your hands often Wash your hands often with soap and water for at least 20 seconds. This is especially important after blowing your nose, coughing, or sneezing; going to the bathroom; and before eating or preparing food. Use hand sanitizer if soap and water are not available. Use an alcohol-based hand sanitizer with at least 60% alcohol, covering all surfaces of your hands and rubbing them together until they feel dry. Soap and water are the best option, especially if  hands are visibly dirty. Avoid touching your eyes, nose, and mouth with unwashed hands. Handwashing Tips Avoid sharing personal household items Do not share dishes, drinking glasses, cups, eating utensils, towels, or bedding with other people in your home. Wash these items thoroughly after using them with soap and water or put in the dishwasher. Clean surfaces in your home regularly Clean and disinfect high-touch surfaces (for example, doorknobs, tables, handles, light switches, and countertops) in your "sick room" and bathroom. In shared spaces, you should clean and disinfect surfaces and items after each use by the person who is ill. If you are sick and cannot clean, a caregiver or other person should only clean and disinfect the area around you (such as your bedroom and bathroom) on an as needed basis. Your caregiver/other person should wait as long as possible (at least several hours) and wear a mask before entering, cleaning, and disinfecting shared spaces that you use. Clean and disinfect areas that may have blood, stool, or body fluids on them. Use household cleaners and disinfectants. Clean visible dirty surfaces with household cleaners containing soap or detergent. Then, use a household disinfectant. Use a product from [employer] N: Disinfectants for Coronavirus (COVID-19). Be sure to follow the instructions on the  label to ensure safe and effective use of the product. Many products recommend keeping the surface wet with a disinfectant for a certain period of time (look at "contact time" on the product label). You may also need to wear personal protective equipment, such as gloves, depending on the directions on the product label. Immediately after disinfecting, wash your hands with soap and water for 20 seconds. For completed guidance on cleaning and disinfecting your home, visit Complete Disinfection Guidance. Take steps to improve ventilation at home Improve ventilation (air flow) at home to help prevent from spreading COVID-19 to other people in your household. Clear out COVID-19 virus particles in the air by opening windows, using air filters, and turning on fans in your home. Use this interactive tool to learn how to improve air flow in your home. When you can be around others after being sick with COVID-19 Deciding when you can be around others is different for different situations. Find out when you can safely end home isolation. For any additional questions about your care, contact your healthcare provider or state or local health department. 06/04/2020 Content source: [facility] ([facility]), Division of Viral Diseases This information is not intended to replace advice given to you by your health care provider. Make sure you discuss any questions you have with your health care provider. Document Revised: 07/18/2020 Document Reviewed: 07/18/2020 Elsevier Patient Education  2022 [employer]      If you have been instructed to have an in-person evaluation today at a local Urgent Care facility, please use the link below. It will take you to a list of all of our available [facility], including address, phone number and hours of operation. Please do not delay care.  [facility]  If you or a family member do not have a primary  care provider, use the link below to schedule a visit and establish care. When you choose a [facility] primary care physician or advanced practice provider, you gain a long-term partner in health. Find a Primary Care Provider  Learn more about [facility]'s in-office and virtual care options: [facility] - Get Care Now

## 2021-04-10 NOTE — Telephone Encounter (Signed)
Call to patient in response to her MyChart- Companion questionnaire.  Left message : Patient had virtual visit today and has received medications for her symptoms. Advised patient we will continue to monitor her responses and we will reach out if we see changes. Contact number for nurse triage given in case she feels she needs to reach out regarding her symptoms.

## 2021-04-11 ENCOUNTER — Other Ambulatory Visit: Payer: Self-pay | Admitting: Physician Assistant

## 2021-04-12 ENCOUNTER — Encounter (INDEPENDENT_AMBULATORY_CARE_PROVIDER_SITE_OTHER): Payer: Self-pay

## 2021-04-12 ENCOUNTER — Telehealth: Payer: Self-pay

## 2021-04-12 NOTE — Telephone Encounter (Signed)
Hungry, Headache, cough worse, SOB after coughing, SOB at rest Thursday am sx Lung COPD, pna frequently 101 by mouth Coughing "neon yellow." Ty fever and headache  Chief Complaint: bright yellow phlegm, SOB at rest, wheezing (pt has at baseline) Symptoms: cough, chest congestion, h/o COPD and O2 sat's are in the 70's, fever, lack of appetite, headache, runny nose, feels like she is "bringing up her lungs" when she coughs Frequency: Thursday am  Pertinent Negatives: Patient denies vomiting, is able to drink fluids. Can talk in full sentences Disposition: [x] ED /[] Urgent Care (no appt availability in office) / [] Appointment(In office/virtual)/ []  [facility]/ [] Home Care/ [] Refused Recommended Disposition /[]  Mobile Bus/ []  Follow-up with PCP Additional Notes: pt wanted to go to UC "3 miles away." Advised pt to call ahead because they may also agree that pt needs to go to ED. Pt is on Paxlovid.   Pt called back and UC referred pt to go to ED due to hypoxia.[facility] ED triage nurse Sharyn Lull) and informed her pt's covid status and a brief report.   Temperature is the same and is noted to be less than 100.4:  Continue to monitor at home.  Advise patient to stay hydrated, push oral fluids if able, and advise pt. to avoid using multiple blankets and layer of clothing to prevent overheating.  Avoid over use of anti-pyretic medications for fevers less than 101 degrees Fahrenheit. Fever helps fight infection If fever remains for greater than 3 days, notify PCP.  If fever becomes greater than 103 and unable to reduce with over the counter medication, contact PCP  If appetite becomes worse:   encourage patient to drink fluids as tolerated, work their way up to bland solid food such as crackers, pretzels, soup, bread or applesauce and boiled starches.  If patient is unable to tolerate any foods or liquids, notify PCP

## 2021-04-13 NOTE — Telephone Encounter (Signed)
Pt called to inform NT that she will be admitted. Pt stated she is on 5 LPM O2 and sats are " not coming up." Pt stated she she just wanted to update. Pt and NT have a good rapport and she felt appreciative of everyone who has cared for her.

## 2021-04-13 NOTE — Telephone Encounter (Signed)
Ellen Holloway request call back for patient for follow up.  Patient states she has been admitted and she is well taken care of. Patient feels with the advise given by nurse triage saved her life. Very grateful for the COVID monitoring program.

## 2021-04-17 ENCOUNTER — Telehealth: Payer: Self-pay

## 2021-04-17 NOTE — Chronic Care Management (AMB) (Signed)
Chronic Care Management Pharmacy Assistant   Name: Ellen Holloway  MRN: [id number] DOB: May 15, 1950  Reason for Encounter: General Adherence Call    Recent office visits:  04/10/21 Marcelline Mates PA-C. Video visit. Seen for Covid. Started on Benzonatate 100mg , Nirmatrelvir-Ritonavir 20x150mg . Started Albuterol Sulfate 2.5 mg nebulization and Albuterol Inhaler .   04/07/21 04/09/21 MD. Seen for Dysuria. Started on Macrobid 100mg .   Recent consult visits:  None  Hospital visits:  None  Medications: Outpatient Encounter Medications as of 04/17/2021  Medication Sig   albuterol (PROVENTIL) (2.5 MG/3ML) 0.083% nebulizer solution Take 3 mLs (2.5 mg total) by nebulization every 6 (six) hours as needed for wheezing or shortness of breath.   albuterol (VENTOLIN HFA) 108 (90 Base) MCG/ACT inhaler Inhale 2 puffs into the lungs every 6 (six) hours as needed for wheezing or shortness of breath.   atorvastatin (LIPITOR) 10 MG tablet TAKE 1 TABLET BY MOUTH EVERY DAY   benzonatate (TESSALON) 100 MG capsule Take 1 capsule (100 mg total) by mouth 3 (three) times daily as needed for cough.   calcium carbonate (OSCAL) 1500 (600 Ca) MG TABS tablet Take 600 mg of elemental calcium by mouth daily.   clonazePAM (KLONOPIN) 1 MG tablet Take 1 tablet (1 mg total) by mouth 2 (two) times daily as needed for anxiety.   cyclobenzaprine (FLEXERIL) 10 MG tablet TAKE 1 TABLET BY MOUTH EVERYDAY AT BEDTIME   famotidine (PEPCID) 40 MG tablet TAKE 1 TABLET BY MOUTH EVERYDAY AT BEDTIME   Fluticasone-Umeclidin-Vilant (TRELEGY ELLIPTA) 100-62.5-25 MCG/INH AEPB Inhale 1 puff into the lungs daily as needed.   furosemide (LASIX) 20 MG tablet TAKE 1 TABLET (20 MG TOTAL) BY MOUTH DAILY AS NEEDED. AS NEEDED FOR SWELLING   ipratropium (ATROVENT) 0.06 % nasal spray 2 sprays each nostril every 6 hours   nitrofurantoin, macrocrystal-monohydrate, (MACROBID) 100 MG capsule Take 1 capsule (100 mg total) by mouth 2 (two) times  daily.   oxyCODONE-acetaminophen (PERCOCET) 10-325 MG tablet Take 1 tablet by mouth every 6 (six) hours.   OXYGEN Inhale into the lungs. 2 L at night.   pantoprazole (PROTONIX) 40 MG tablet TAKE 1 TABLET BY MOUTH TWICE A DAY   pramipexole (MIRAPEX) 1 MG tablet TAKE 1 TABLET BY MOUTH EVERYDAY AT BEDTIME   promethazine (PHENERGAN) 25 MG tablet Take 1 tablet (25 mg total) by mouth every 6 (six) hours as needed for nausea or vomiting.   sertraline (ZOLOFT) 100 MG tablet TAKE 2 TABLETS BY MOUTH EVERY DAY   spironolactone (ALDACTONE) 25 MG tablet TAKE 1 TABLET BY MOUTH EVERY DAY   VITAMIN D-VITAMIN K PO Take 1 tablet by mouth daily.   No facility-administered encounter medications on file as of 04/17/2021.   Contacted 04-14-1985 for general disease state and medication adherence call.   Patient is > 5 days past due for refill on the following medications per chart history:  [facility] Medications: Medication Name/mg Last Fill Days Supply Atorvastatin 10mg   07/28/20 90ds   What concerns do you have about your medications? Pt denies any concerns   The patient denies side effects with her medications.   How often do you forget or accidentally miss a dose? Never  Do you use a pillbox? Yes  Are you having any problems getting your medications from your pharmacy? No  Has the cost of your medications been a concern? No  Since last visit with CPP, no interventions have been made:   The patient has had an  ED visit since last contact. Pt was admitted on 04/12/21 and was discharged on 04/17/21  Pt had Covid and Pneumonia and pt started hallucinating. Pt stated she is still fatigue but feels better.  The patient denies problems with their health.   she denies  concerns or questions for Artelia Laroche, at this time.    Care Gaps: Last annual wellness visit?12/26/20 If applicable:N/A Last eye exam / retinopathy screening? Diabetic foot exam? Dexa Scan: 03/21/21 Mammogram: 01/06/21  [name], CMA Clinical Pharmacist Assistant  [phone]

## 2021-04-17 NOTE — Chronic Care Management (AMB) (Signed)
° ° °  Chronic Care Management Pharmacy Assistant   Name: Ellen Holloway  MRN: [id number] DOB: 02/16/1951   Reason for Encounter: [name] Benefit Verification  04/17/2021-  [name] was changed from Prolia to [place] her last Prolia injection was on 01/10/2021, discussed with Prolia Representative and patient can get her Evenity shot now. Patient is covered and approved at no cost. Caro Hight, LPN aware and will order Evenity and call patient to schedule a nurse visit for injection.   Medications: Outpatient Encounter Medications as of 04/17/2021  Medication Sig   albuterol (PROVENTIL) (2.5 MG/3ML) 0.083% nebulizer solution Take 3 mLs (2.5 mg total) by nebulization every 6 (six) hours as needed for wheezing or shortness of breath.   albuterol (VENTOLIN HFA) 108 (90 Base) MCG/ACT inhaler Inhale 2 puffs into the lungs every 6 (six) hours as needed for wheezing or shortness of breath.   atorvastatin (LIPITOR) 10 MG tablet TAKE 1 TABLET BY MOUTH EVERY DAY   benzonatate (TESSALON) 100 MG capsule Take 1 capsule (100 mg total) by mouth 3 (three) times daily as needed for cough.   calcium carbonate (OSCAL) 1500 (600 Ca) MG TABS tablet Take 600 mg of elemental calcium by mouth daily.   clonazePAM (KLONOPIN) 1 MG tablet Take 1 tablet (1 mg total) by mouth 2 (two) times daily as needed for anxiety.   cyclobenzaprine (FLEXERIL) 10 MG tablet TAKE 1 TABLET BY MOUTH EVERYDAY AT BEDTIME   famotidine (PEPCID) 40 MG tablet TAKE 1 TABLET BY MOUTH EVERYDAY AT BEDTIME   Fluticasone-Umeclidin-Vilant (TRELEGY ELLIPTA) 100-62.5-25 MCG/INH AEPB Inhale 1 puff into the lungs daily as needed.   furosemide (LASIX) 20 MG tablet TAKE 1 TABLET (20 MG TOTAL) BY MOUTH DAILY AS NEEDED. AS NEEDED FOR SWELLING   ipratropium (ATROVENT) 0.06 % nasal spray 2 sprays each nostril every 6 hours   nitrofurantoin, macrocrystal-monohydrate, (MACROBID) 100 MG capsule Take 1 capsule (100 mg total) by mouth 2 (two) times daily.    oxyCODONE-acetaminophen (PERCOCET) 10-325 MG tablet Take 1 tablet by mouth every 6 (six) hours.   OXYGEN Inhale into the lungs. 2 L at night.   pantoprazole (PROTONIX) 40 MG tablet TAKE 1 TABLET BY MOUTH TWICE A DAY   pramipexole (MIRAPEX) 1 MG tablet TAKE 1 TABLET BY MOUTH EVERYDAY AT BEDTIME   promethazine (PHENERGAN) 25 MG tablet Take 1 tablet (25 mg total) by mouth every 6 (six) hours as needed for nausea or vomiting.   sertraline (ZOLOFT) 100 MG tablet TAKE 2 TABLETS BY MOUTH EVERY DAY   spironolactone (ALDACTONE) 25 MG tablet TAKE 1 TABLET BY MOUTH EVERY DAY   VITAMIN D-VITAMIN K PO Take 1 tablet by mouth daily.   No facility-administered encounter medications on file as of 04/17/2021.   Billee Cashing, CMA Clinical Pharmacist Assistant [phone]

## 2021-04-18 ENCOUNTER — Telehealth: Payer: Self-pay

## 2021-04-18 NOTE — Telephone Encounter (Signed)
BPA triggered for worsening symptoms appetite. Patient called, left VM to return the call to [facility] [phone] to speak to a nurse about MyChart Questionnaire. Will send MyChart message with the below advice:   If appetite becomes worse:   encourage patient to drink fluids as tolerated, work their way up to bland solid food such as crackers, pretzels, soup, bread or applesauce and boiled starches.  If patient is unable to tolerate any foods or liquids, notify PCP.  If patient develops severe vomiting (more than 6 times a day and or >8 hours) and/or severe abdominal pain advise patient to call 911 and seek treatment in ED

## 2021-04-20 ENCOUNTER — Encounter: Payer: Self-pay | Admitting: Family Medicine

## 2021-04-21 ENCOUNTER — Encounter: Payer: Self-pay | Admitting: Family Medicine

## 2021-04-21 ENCOUNTER — Ambulatory Visit (INDEPENDENT_AMBULATORY_CARE_PROVIDER_SITE_OTHER): Payer: Medicare Other | Admitting: Family Medicine

## 2021-04-21 VITALS — BP 110/70 | HR 96 | Temp 97.2°F | Wt 110.0 lb

## 2021-04-21 DIAGNOSIS — U071 COVID-19: Secondary | ICD-10-CM | POA: Diagnosis not present

## 2021-04-21 DIAGNOSIS — I808 Phlebitis and thrombophlebitis of other sites: Secondary | ICD-10-CM | POA: Diagnosis not present

## 2021-04-21 DIAGNOSIS — J9611 Chronic respiratory failure with hypoxia: Secondary | ICD-10-CM | POA: Diagnosis not present

## 2021-04-21 DIAGNOSIS — J22 Unspecified acute lower respiratory infection: Secondary | ICD-10-CM | POA: Insufficient documentation

## 2021-04-21 NOTE — Patient Instructions (Signed)
Recommend warm wet compresses to phlebitis.  May use ice also. Which ever works better.   Continue to wean off oxygen.

## 2021-04-21 NOTE — Assessment & Plan Note (Signed)
Recommend warm wet compresses or ice which ever makes it feel better.

## 2021-04-21 NOTE — Assessment & Plan Note (Signed)
Use flutter and IS Recommended continue O2 at 3 L. Gradually wean off. Patient has pulse oximetry

## 2021-04-21 NOTE — Assessment & Plan Note (Signed)
Complete depomedrol

## 2021-04-21 NOTE — Progress Notes (Signed)
Subjective:  Patient ID: Ellen Holloway, female    DOB: 1950/12/29  Age: 71 y.o. MRN: [id number]  Chief Complaint  Patient presents with   Hospitalization Follow-up    HPI Chronic hypoxic respiratory failure secondary to COVID pneumonia and COPD exacerbation with mucus plugging.  She was treated with supplemental oxygen, IV remdesivir and Decadron.  Patient was given up to 5 L oxygen while in the hospital. D Dimer was positive and cta of chest was done which showed mucous plugs, but no Pulmonary emboli She improved significantly but continued to need O2 @ 2-3 L.min.  She had delirium initially probably due to combo of pneumonia as well as steroids, which resolved before leaving the hospital.  She had a mild acute transaminitis secondary to acute viral infection but improving on discharge.  Patient has been doing well since discharged on 04/17/21 except for decreased appetite. She is drinking plenty. She is wearing O2 at 3 L. Today her husband dropped her off because she could not get her portable oxygen to work.  Current Outpatient Medications on File Prior to Visit  Medication Sig Dispense Refill   albuterol (PROVENTIL) (2.5 MG/3ML) 0.083% nebulizer solution Take 3 mLs (2.5 mg total) by nebulization every 6 (six) hours as needed for wheezing or shortness of breath. 75 mL 1   albuterol (VENTOLIN HFA) 108 (90 Base) MCG/ACT inhaler Inhale 2 puffs into the lungs every 6 (six) hours as needed for wheezing or shortness of breath. 8 g 0   atorvastatin (LIPITOR) 10 MG tablet TAKE 1 TABLET BY MOUTH EVERY DAY 90 tablet 1   benzonatate (TESSALON) 100 MG capsule Take 1 capsule (100 mg total) by mouth 3 (three) times daily as needed for cough. 30 capsule 0   calcium carbonate (OSCAL) 1500 (600 Ca) MG TABS tablet Take 600 mg of elemental calcium by mouth daily.     clonazePAM (KLONOPIN) 1 MG tablet Take 1 tablet (1 mg total) by mouth 2 (two) times daily as needed for anxiety. 30 tablet 1   cyclobenzaprine  (FLEXERIL) 10 MG tablet TAKE 1 TABLET BY MOUTH EVERYDAY AT BEDTIME 30 tablet 2   famotidine (PEPCID) 40 MG tablet TAKE 1 TABLET BY MOUTH EVERYDAY AT BEDTIME 90 tablet 2   Fluticasone-Umeclidin-Vilant (TRELEGY ELLIPTA) 100-62.5-25 MCG/INH AEPB Inhale 1 puff into the lungs daily as needed.     furosemide (LASIX) 20 MG tablet TAKE 1 TABLET (20 MG TOTAL) BY MOUTH DAILY AS NEEDED. AS NEEDED FOR SWELLING 90 tablet 0   ipratropium (ATROVENT) 0.06 % nasal spray 2 sprays each nostril every 6 hours 15 mL 5   nitrofurantoin, macrocrystal-monohydrate, (MACROBID) 100 MG capsule Take 1 capsule (100 mg total) by mouth 2 (two) times daily. 14 capsule 0   oxyCODONE-acetaminophen (PERCOCET) 10-325 MG tablet Take 1 tablet by mouth every 6 (six) hours.     OXYGEN Inhale into the lungs. 2 L at night.     pantoprazole (PROTONIX) 40 MG tablet TAKE 1 TABLET BY MOUTH TWICE A DAY 180 tablet 3   pramipexole (MIRAPEX) 1 MG tablet TAKE 1 TABLET BY MOUTH EVERYDAY AT BEDTIME 90 tablet 1   promethazine (PHENERGAN) 25 MG tablet Take 1 tablet (25 mg total) by mouth every 6 (six) hours as needed for nausea or vomiting. 30 tablet 1   sertraline (ZOLOFT) 100 MG tablet TAKE 2 TABLETS BY MOUTH EVERY DAY 180 tablet 1   spironolactone (ALDACTONE) 25 MG tablet TAKE 1 TABLET BY MOUTH EVERY DAY 90 tablet 1  VITAMIN D-VITAMIN K PO Take 1 tablet by mouth daily.     No current facility-administered medications on file prior to visit.   Past Medical History:  Diagnosis Date   Acquired absence of both cervix and uterus    Age-related osteoporosis with current pathological fracture, unspecified site, initial encounter for fracture    Asthma    Barrett's esophagus with low grade dysplasia    Chronic obstructive pulmonary disease (COPD) (HCC)    Major depressive disorder, recurrent severe without psychotic features (HCC)    Mixed hyperlipidemia    Obstructive sleep apnea (adult) (pediatric)    Other obesity due to excess calories    Other  pulmonary embolism without acute cor pulmonale (HCC)    Other specified anxiety disorders    Sleep related leg cramps    Telogen effluvium    Urticaria    Past Surgical History:  Procedure Laterality Date   ABDOMINAL HYSTERECTOMY  1980   partial   right wrist 1985      Family History  Problem Relation Age of Onset   Obesity Mother    Heart attack Father    Allergic rhinitis Sister    Asthma Brother    Renal Disease Brother    Social History   Socioeconomic History   Marital status: Married    Spouse name: Lanny Hurst   Number of children: 2   Years of education: Not on file   Highest education level: Not on file  Occupational History   Not on file  Tobacco Use   Smoking status: Former    Packs/day: 0.50    Years: 55.00    Pack years: 27.50    Types: E-cigarettes, Cigarettes   Smokeless tobacco: Never   Tobacco comments:    Patient used to smoke up to 2 PPD.  Substance and Sexual Activity   Alcohol use: Yes    Alcohol/week: 1.0 standard drink    Types: 1 Cans of beer per week    Comment: occasional beer   Drug use: Never   Sexual activity: Not on file  Other Topics Concern   Not on file  Social History Narrative   Not on file   Social Determinants of Health   Financial Resource Strain: Low Risk    Difficulty of Paying Living Expenses: Not hard at all  Food Insecurity: No Food Insecurity   Worried About Charity fundraiser in the Last Year: Never true   [place] in the Last Year: Never true  Transportation Needs: No Transportation Needs   Lack of Transportation (Medical): No   Lack of Transportation (Non-Medical): No  Physical Activity: Not on file  Stress: Not on file  Social Connections: Not on file    Review of Systems  Constitutional:  Positive for fatigue. Negative for chills and fever.  HENT:  Negative for congestion, ear pain and sore throat.   Respiratory:  Negative for cough and shortness of breath.   Cardiovascular:  Negative for chest  pain.    Objective:  BP 110/70    Pulse 96    Temp (!) 97.2 F (36.2 C)    Wt 110 lb (49.9 kg)    SpO2 95%    BMI 19.18 kg/m   BP/Weight 04/21/2021 04/07/2021 0000000  Systolic BP [id number]  Diastolic BP 70 70 64  Wt. (Lbs) 110 112 113  BMI 19.18 19.53 19.4    Physical Exam Vitals reviewed.  Constitutional:      Appearance: Normal  appearance. She is normal weight.  Cardiovascular:     Rate and Rhythm: Normal rate and regular rhythm.     Heart sounds: Normal heart sounds.  Pulmonary:     Effort: Pulmonary effort is normal. No respiratory distress.     Breath sounds: Normal breath sounds.  Abdominal:     General: Abdomen is flat. Bowel sounds are normal.     Palpations: Abdomen is soft.     Tenderness: There is no abdominal tenderness.  Neurological:     Mental Status: She is alert and oriented to person, place, and time.  Psychiatric:        Mood and Affect: Mood normal.        Behavior: Behavior normal.    Diabetic Foot Exam - Simple   No data filed      Lab Results  Component Value Date   WBC 6.0 11/19/2020   HGB 13.9 11/19/2020   HCT 42.2 11/19/2020   PLT 220 11/19/2020   GLUCOSE 98 02/25/2021   CHOL 153 02/25/2021   TRIG 75 02/25/2021   HDL 80 02/25/2021   LDLCALC 59 02/25/2021   ALT 17 02/25/2021   AST 21 02/25/2021   NA 140 02/25/2021   K 4.2 02/25/2021   CL 103 02/25/2021   CREATININE 0.86 02/25/2021   BUN 12 02/25/2021   CO2 22 02/25/2021   TSH 2.520 08/13/2020      Assessment & Plan:   Problem List Items Addressed This Visit       Cardiovascular and Mediastinum   Superficial thrombophlebitis of right upper extremity    Recommend warm wet compresses or ice which ever makes it feel better.         Respiratory   Chronic respiratory failure with hypoxia (HCC) - Primary    Use flutter and IS Recommended continue O2 at 3 L. Gradually wean off. Patient has pulse oximetry      Lower respiratory tract infection due to COVID-19 virus     Complete depomedrol      Total time spent on today's visit was greater than 30 minutes, including both face-to-face time and nonface-to-face time personally spent on review of chart (labs and imaging), discussing labs and goals, discussing further work-up, treatment options, referrals to specialist if needed, reviewing outside records of pertinent, answering patient's questions, and coordinating care.  Follow-up: Return if symptoms worsen or fail to improve.  An After Visit Summary was printed and given to the patient.  Rochel Brome, MD [facility] [phone]

## 2021-04-23 NOTE — Telephone Encounter (Signed)
Coordinate with team to get patient seen for Hx DC visit

## 2021-05-13 ENCOUNTER — Other Ambulatory Visit: Payer: Self-pay | Admitting: Family Medicine

## 2021-05-13 DIAGNOSIS — J301 Allergic rhinitis due to pollen: Secondary | ICD-10-CM

## 2021-05-13 NOTE — Telephone Encounter (Signed)
Refill sent to pharmacy.   

## 2021-05-18 ENCOUNTER — Other Ambulatory Visit: Payer: Self-pay | Admitting: Family Medicine

## 2021-05-21 ENCOUNTER — Telehealth: Payer: Self-pay

## 2021-05-21 NOTE — Chronic Care Management (AMB) (Signed)
? ? ?Chronic Care Management ?Pharmacy Assistant  ? ?Name: Ellen Holloway  MRN: [id number] DOB: 1950/06/03 ? ? ?Reason for Encounter: General Adherence Call  ?  ?Recent office visits:  ?04/21/21 Blane Ohara MD. Seen for Hospital F/U. Started on Albuterol Sulfate 108 inhaler.  ? ?Recent consult visits:  ?None ? ?Hospital visits:  ?None ? ?Medications: ?Outpatient Encounter Medications as of 05/21/2021  ?Medication Sig  ? albuterol (VENTOLIN HFA) 108 (90 Base) MCG/ACT inhaler Inhale 2 puffs into the lungs every 6 (six) hours as needed for wheezing or shortness of breath.  ? atorvastatin (LIPITOR) 10 MG tablet TAKE 1 TABLET BY MOUTH EVERY DAY  ? benzonatate (TESSALON) 100 MG capsule Take 1 capsule (100 mg total) by mouth 3 (three) times daily as needed for cough.  ? calcium carbonate (OSCAL) 1500 (600 Ca) MG TABS tablet Take 600 mg of elemental calcium by mouth daily.  ? chlorpheniramine-HYDROcodone 10-8 MG/5ML Take 5 mLs by mouth 2 (two) times daily as needed.  ? clonazePAM (KLONOPIN) 1 MG tablet Take 1 tablet (1 mg total) by mouth 2 (two) times daily as needed for anxiety.  ? cyclobenzaprine (FLEXERIL) 10 MG tablet TAKE 1 TABLET BY MOUTH EVERYDAY AT BEDTIME  ? dexamethasone (DECADRON) 4 MG tablet Take 6 mg by mouth daily.  ? famotidine (PEPCID) 40 MG tablet TAKE 1 TABLET BY MOUTH EVERYDAY AT BEDTIME  ? Fluticasone-Umeclidin-Vilant (TRELEGY ELLIPTA) 100-62.5-25 MCG/INH AEPB Inhale 1 puff into the lungs daily as needed.  ? furosemide (LASIX) 20 MG tablet TAKE 1 TABLET (20 MG TOTAL) BY MOUTH DAILY AS NEEDED. AS NEEDED FOR SWELLING  ? ipratropium (ATROVENT) 0.06 % nasal spray 2 sprays each nostril every 6 hours  ? montelukast (SINGULAIR) 10 MG tablet TAKE 1 TABLET BY MOUTH EVERYDAY AT BEDTIME  ? oxyCODONE-acetaminophen (PERCOCET) 10-325 MG tablet Take 1 tablet by mouth every 6 (six) hours.  ? OXYGEN Inhale into the lungs. 2 L at night.  ? pantoprazole (PROTONIX) 40 MG tablet TAKE 1 TABLET BY MOUTH TWICE A DAY  ? pramipexole  (MIRAPEX) 1 MG tablet TAKE 1 TABLET BY MOUTH EVERYDAY AT BEDTIME  ? promethazine (PHENERGAN) 25 MG tablet Take 1 tablet (25 mg total) by mouth every 6 (six) hours as needed for nausea or vomiting.  ? sertraline (ZOLOFT) 100 MG tablet TAKE 2 TABLETS BY MOUTH EVERY DAY  ? spironolactone (ALDACTONE) 25 MG tablet TAKE 1 TABLET BY MOUTH EVERY DAY  ? VITAMIN D-VITAMIN K PO Take 1 tablet by mouth daily.  ? ?No facility-administered encounter medications on file as of 05/21/2021.  ? ? ?Contacted [name] for general disease state and medication adherence call.  ? ?Patient is > 5 days past due for refill on the following medications per chart history: ? ?Star Medications: ?Medication Name/mg Last Fill Days Supply ?Atorvastatin 10mg                    07/28/20            90ds ? ? ?What concerns do you have about your medications?Pt denies any concerns  ? ?The patient denies side effects with her medications.  ? ?How often do you forget or accidentally miss a dose? Never ? ?Do you use a pillbox? Yes ? ?Are you having any problems getting your medications from your pharmacy? No ? ?Has the cost of your medications been a concern? No ? ?Since last visit with CPP, no interventions have been made:  ? ?The patient has not had  an ED visit since last contact. Not since last call was made. Pt stated she is doing a lot better. Still not feeling 100% like herself but is fine. ? ?The patient denies problems with their health.  ? ?she denies  concerns or questions for Artelia Laroche at this time.  ? ? ?Care Gaps: ?Last annual wellness visit?12/26/20 ?If applicable:N/A ?Last eye exam / retinopathy screening? ?Diabetic foot exam? ?Dexa Scan: 03/21/21 ?Mammogram: 01/06/21 ? ?[name], CMA ?Clinical Pharmacist Assistant  ?[phone]  ?

## 2021-05-28 ENCOUNTER — Ambulatory Visit (INDEPENDENT_AMBULATORY_CARE_PROVIDER_SITE_OTHER): Payer: Medicare Other

## 2021-05-28 DIAGNOSIS — M810 Age-related osteoporosis without current pathological fracture: Secondary | ICD-10-CM

## 2021-05-28 MED ORDER — ROMOSOZUMAB-AQQG 105 MG/1.17ML ~~LOC~~ SOSY
210.0000 mg | PREFILLED_SYRINGE | Freq: Once | SUBCUTANEOUS | Status: AC
  Administered 2021-05-28: 210 mg via SUBCUTANEOUS

## 2021-05-28 NOTE — Progress Notes (Signed)
Patient in office for first Evenity shot. Two 105 mg SQ injections given in bilateral arms to equal 210 mg solution.  Patient tolerated.  ? ?Ellen Holloway 05/28/21 9:42 AM ? ?

## 2021-06-17 ENCOUNTER — Telehealth: Payer: Self-pay

## 2021-06-17 NOTE — Progress Notes (Signed)
? ? ?Chronic Care Management ?Pharmacy Assistant  ? ?Name: Ellen Holloway  MRN: [id number] DOB: 07/29/1950 ? ? ?Reason for Encounter: General Adherence Call  ?  ?Recent office visits:  ?05/28/21 Ellen Hammock LPN. Seen for [employer]. No med changes.  ? ?Recent consult visits:  ?None ? ?Hospital visits:  ?None ? ?Medications: ?Outpatient Encounter Medications as of 06/17/2021  ?Medication Sig  ? albuterol (VENTOLIN HFA) 108 (90 Base) MCG/ACT inhaler Inhale 2 puffs into the lungs every 6 (six) hours as needed for wheezing or shortness of breath.  ? atorvastatin (LIPITOR) 10 MG tablet TAKE 1 TABLET BY MOUTH EVERY DAY  ? benzonatate (TESSALON) 100 MG capsule Take 1 capsule (100 mg total) by mouth 3 (three) times daily as needed for cough.  ? calcium carbonate (OSCAL) 1500 (600 Ca) MG TABS tablet Take 600 mg of elemental calcium by mouth daily.  ? chlorpheniramine-HYDROcodone 10-8 MG/5ML Take 5 mLs by mouth 2 (two) times daily as needed.  ? clonazePAM (KLONOPIN) 1 MG tablet Take 1 tablet (1 mg total) by mouth 2 (two) times daily as needed for anxiety.  ? cyclobenzaprine (FLEXERIL) 10 MG tablet TAKE 1 TABLET BY MOUTH EVERYDAY AT BEDTIME  ? dexamethasone (DECADRON) 4 MG tablet Take 6 mg by mouth daily.  ? famotidine (PEPCID) 40 MG tablet TAKE 1 TABLET BY MOUTH EVERYDAY AT BEDTIME  ? Fluticasone-Umeclidin-Vilant (TRELEGY ELLIPTA) 100-62.5-25 MCG/INH AEPB Inhale 1 puff into the lungs daily as needed.  ? furosemide (LASIX) 20 MG tablet TAKE 1 TABLET (20 MG TOTAL) BY MOUTH DAILY AS NEEDED. AS NEEDED FOR SWELLING  ? ipratropium (ATROVENT) 0.06 % nasal spray 2 sprays each nostril every 6 hours  ? montelukast (SINGULAIR) 10 MG tablet TAKE 1 TABLET BY MOUTH EVERYDAY AT BEDTIME  ? oxyCODONE-acetaminophen (PERCOCET) 10-325 MG tablet Take 1 tablet by mouth every 6 (six) hours.  ? OXYGEN Inhale into the lungs. 2 L at night.  ? pantoprazole (PROTONIX) 40 MG tablet TAKE 1 TABLET BY MOUTH TWICE A DAY  ? pramipexole (MIRAPEX) 1 MG  tablet TAKE 1 TABLET BY MOUTH EVERYDAY AT BEDTIME  ? promethazine (PHENERGAN) 25 MG tablet Take 1 tablet (25 mg total) by mouth every 6 (six) hours as needed for nausea or vomiting.  ? sertraline (ZOLOFT) 100 MG tablet TAKE 2 TABLETS BY MOUTH EVERY DAY  ? spironolactone (ALDACTONE) 25 MG tablet TAKE 1 TABLET BY MOUTH EVERY DAY  ? VITAMIN D-VITAMIN K PO Take 1 tablet by mouth daily.  ? ?No facility-administered encounter medications on file as of 06/17/2021.  ? ? ?Contacted Ellen Holloway for general disease state and medication adherence call.  ? ?Patient is > 5 days past due for refill on the following medications per chart history: ? ?Star Medications: ?Medication Name/mg Last Fill Days Supply ?Atorvastatin 10 mg   07/28/20 90ds ?    04/07/20 90ds ? ?Pt is on the last bottle of the Atorvastatin due to get an oversupply at the pharmacy. She promises she is taking it.  ? ?What concerns do you have about your medications?Pt has no concerns  ? ?The patient denies side effects with her medications.  ? ?How often do you forget or accidentally miss a dose? Never ? ?Do you use a pillbox? Yes ? ?Are you having any problems getting your medications from your pharmacy? No ? ?Has the cost of your medications been a concern? No ? ?Since last visit with CPP, no interventions have been made:  ? ?The patient has not  had an ED visit since last contact.  ? ?The patient denies problems with their health.  ? ?she denies  concerns or questions for Ellen Holloway, at this time.  ? ? ? ?Care Gaps: ?Last annual wellness visit?12/26/20 ?If applicable:N/A ?Last eye exam / retinopathy screening? ?Diabetic foot exam? ?Dexa Scan: 03/21/21 ?Mammogram: 01/06/21 ? ? ?Ellen Holloway, CMA ?Clinical Pharmacist Assistant  ?[phone]  ?

## 2021-06-30 ENCOUNTER — Ambulatory Visit (INDEPENDENT_AMBULATORY_CARE_PROVIDER_SITE_OTHER): Payer: Medicare Other

## 2021-06-30 DIAGNOSIS — M8000XA Age-related osteoporosis with current pathological fracture, unspecified site, initial encounter for fracture: Secondary | ICD-10-CM

## 2021-06-30 MED ORDER — ROMOSOZUMAB-AQQG 105 MG/1.17ML ~~LOC~~ SOSY
210.0000 mg | PREFILLED_SYRINGE | Freq: Once | SUBCUTANEOUS | Status: AC
  Administered 2021-06-30: 210 mg via SUBCUTANEOUS

## 2021-06-30 NOTE — Progress Notes (Signed)
? ?  Evenity injection given (one in each arm) per order, patient tolerated well.  ? ?Jacklynn Bue, LPN ?3:61 AM ?

## 2021-07-13 ENCOUNTER — Other Ambulatory Visit: Payer: Self-pay | Admitting: Physician Assistant

## 2021-07-15 ENCOUNTER — Telehealth: Payer: Self-pay

## 2021-07-15 NOTE — Progress Notes (Signed)
? ? ?Chronic Care Management ?Pharmacy Assistant  ? ?Name: Ellen Holloway  MRN: [id number] DOB: 07/22/1950 ? ? ?Reason for Encounter: General Adherence Call  ? ?Recent office visits:  ?06/30/21 Caro Hight LPN. Seen for Osteoporosis. Injected Evenity 210mg .  ? ?Recent consult visits:  ?None ? ?Hospital visits:  ?None ? ?Medications: ?Outpatient Encounter Medications as of 07/15/2021  ?Medication Sig  ? albuterol (VENTOLIN HFA) 108 (90 Base) MCG/ACT inhaler Inhale 2 puffs into the lungs every 6 (six) hours as needed for wheezing or shortness of breath.  ? atorvastatin (LIPITOR) 10 MG tablet TAKE 1 TABLET BY MOUTH EVERY DAY  ? benzonatate (TESSALON) 100 MG capsule Take 1 capsule (100 mg total) by mouth 3 (three) times daily as needed for cough.  ? calcium carbonate (OSCAL) 1500 (600 Ca) MG TABS tablet Take 600 mg of elemental calcium by mouth daily.  ? chlorpheniramine-HYDROcodone 10-8 MG/5ML Take 5 mLs by mouth 2 (two) times daily as needed.  ? clonazePAM (KLONOPIN) 1 MG tablet Take 1 tablet (1 mg total) by mouth 2 (two) times daily as needed for anxiety.  ? cyclobenzaprine (FLEXERIL) 10 MG tablet TAKE 1 TABLET BY MOUTH EVERYDAY AT BEDTIME  ? dexamethasone (DECADRON) 4 MG tablet Take 6 mg by mouth daily.  ? famotidine (PEPCID) 40 MG tablet TAKE 1 TABLET BY MOUTH EVERYDAY AT BEDTIME  ? Fluticasone-Umeclidin-Vilant (TRELEGY ELLIPTA) 100-62.5-25 MCG/INH AEPB Inhale 1 puff into the lungs daily as needed.  ? furosemide (LASIX) 20 MG tablet TAKE 1 TABLET (20 MG TOTAL) BY MOUTH DAILY AS NEEDED FOR SWELLING  ? ipratropium (ATROVENT) 0.06 % nasal spray 2 sprays each nostril every 6 hours  ? montelukast (SINGULAIR) 10 MG tablet TAKE 1 TABLET BY MOUTH EVERYDAY AT BEDTIME  ? oxyCODONE-acetaminophen (PERCOCET) 10-325 MG tablet Take 1 tablet by mouth every 6 (six) hours.  ? OXYGEN Inhale into the lungs. 2 L at night.  ? pantoprazole (PROTONIX) 40 MG tablet TAKE 1 TABLET BY MOUTH TWICE A DAY  ? pramipexole (MIRAPEX) 1 MG tablet  TAKE 1 TABLET BY MOUTH EVERYDAY AT BEDTIME  ? promethazine (PHENERGAN) 25 MG tablet Take 1 tablet (25 mg total) by mouth every 6 (six) hours as needed for nausea or vomiting.  ? sertraline (ZOLOFT) 100 MG tablet TAKE 2 TABLETS BY MOUTH EVERY DAY  ? spironolactone (ALDACTONE) 25 MG tablet TAKE 1 TABLET BY MOUTH EVERY DAY  ? VITAMIN D-VITAMIN K PO Take 1 tablet by mouth daily.  ? ?No facility-administered encounter medications on file as of 07/15/2021.  ? ? ?Contacted 09/14/2021 for General Review Call ? ? ?Chart Review: ? ?Have there been any documented new, changed, or discontinued medications since last visit? No  ?Has there been any documented recent hospitalizations or ED visits since last visit with Clinical Pharmacist? No ?Brief Summary (including medication and/or Diagnosis changes):None ? ? ?Adherence Review: ? ?Does the Clinical Pharmacist Assistant have access to adherence rates? Yes ?Adherence rates for STAR metric medications Atorvastatin 10mg  07/28/20 90ds, 04/07/20 90ds ?Does the patient have >5 day gap between last estimated fill dates for any of the above medications or other medication gaps? Yes ?Reason for medication gaps.Pt stated she is still using what she had on hand and has a few weeks left and then getting a new refill  ? ? ?Disease State Questions: ? ?Able to connect with Patient? Yes ?Did patient have any problems with their health recently? No ?: ?Have you had any admissions or emergency room visits or worsening  of your condition(s) since last visit? No ? ?Have you had any visits with new specialists or providers since your last visit? Yes, pt has seen her pain management physician  ? ?Have you had any new health care problem(s) since your last visit? No ? ?Have you run out of any of your medications since you last spoke with clinical pharmacist? No ? ?Are there any medications you are not taking as prescribed? No ? ?Are you having any issues or side effects with your medications? No ? ?Do  you have any other health concerns or questions you want to discuss with your Clinical Pharmacist before your next visit? No ? ?Are there any health concerns that you feel we can do a better job addressing? No ? ?Are you having any problems with any of the following since the last visit: (select all that apply) ? None ? Details: ?12. Any falls since last visit? No ? Details: ?13. Any increased or uncontrolled pain since last visit? No ? Details: ?14. Next visit Type: office ?      Visit with:Nurse  ?       Date:08/04/21 ?       Time:9:00am ? ?15. Additional Details? No  ? ?[name], CMA ?Clinical Pharmacist Assistant  ?[phone]  ?

## 2021-08-04 ENCOUNTER — Ambulatory Visit (INDEPENDENT_AMBULATORY_CARE_PROVIDER_SITE_OTHER): Payer: Medicare Other

## 2021-08-04 DIAGNOSIS — M8000XA Age-related osteoporosis with current pathological fracture, unspecified site, initial encounter for fracture: Secondary | ICD-10-CM | POA: Diagnosis not present

## 2021-08-04 MED ORDER — ROMOSOZUMAB-AQQG 105 MG/1.17ML ~~LOC~~ SOSY
210.0000 mg | PREFILLED_SYRINGE | Freq: Once | SUBCUTANEOUS | Status: AC
  Administered 2021-08-04: 210 mg via SUBCUTANEOUS

## 2021-08-04 NOTE — Progress Notes (Unsigned)
Patient in office for evenity injection. Patient tolerated two doses of 105 mg for total of 210 mg. She will return in one month for next dose.   Terrill Mohr 08/04/21 9:19 AM

## 2021-08-13 ENCOUNTER — Other Ambulatory Visit: Payer: Self-pay | Admitting: Family Medicine

## 2021-08-20 ENCOUNTER — Telehealth: Payer: Self-pay

## 2021-08-20 NOTE — Progress Notes (Signed)
Chronic Care Management Pharmacy Assistant   Name: Ellen Holloway  MRN: [id number] DOB: [birth year]   Reason for Encounter: General Adherence Call    Recent office visits:  08/04/21 Caro Hight LPN. Seen for Evenity Injection. No med changes,.  Recent consult visits:  None  Hospital visits:  None  Medications: Outpatient Encounter Medications as of 08/20/2021  Medication Sig   albuterol (VENTOLIN HFA) 108 (90 Base) MCG/ACT inhaler Inhale 2 puffs into the lungs every 6 (six) hours as needed for wheezing or shortness of breath.   atorvastatin (LIPITOR) 10 MG tablet TAKE 1 TABLET BY MOUTH EVERY DAY   benzonatate (TESSALON) 100 MG capsule Take 1 capsule (100 mg total) by mouth 3 (three) times daily as needed for cough.   calcium carbonate (OSCAL) 1500 (600 Ca) MG TABS tablet Take 600 mg of elemental calcium by mouth daily.   chlorpheniramine-HYDROcodone 10-8 MG/5ML Take 5 mLs by mouth 2 (two) times daily as needed.   clonazePAM (KLONOPIN) 1 MG tablet Take 1 tablet (1 mg total) by mouth 2 (two) times daily as needed for anxiety.   cyclobenzaprine (FLEXERIL) 10 MG tablet TAKE 1 TABLET BY MOUTH EVERYDAY AT BEDTIME   dexamethasone (DECADRON) 4 MG tablet Take 6 mg by mouth daily.   famotidine (PEPCID) 40 MG tablet TAKE 1 TABLET BY MOUTH EVERYDAY AT BEDTIME   Fluticasone-Umeclidin-Vilant (TRELEGY ELLIPTA) 100-62.5-25 MCG/INH AEPB Inhale 1 puff into the lungs daily as needed.   furosemide (LASIX) 20 MG tablet TAKE 1 TABLET (20 MG TOTAL) BY MOUTH DAILY AS NEEDED FOR SWELLING   ipratropium (ATROVENT) 0.06 % nasal spray 2 sprays each nostril every 6 hours   montelukast (SINGULAIR) 10 MG tablet TAKE 1 TABLET BY MOUTH EVERYDAY AT BEDTIME   oxyCODONE-acetaminophen (PERCOCET) 10-325 MG tablet Take 1 tablet by mouth every 6 (six) hours.   OXYGEN Inhale into the lungs. 2 L at night.   pantoprazole (PROTONIX) 40 MG tablet TAKE 1 TABLET BY MOUTH TWICE A DAY   pramipexole (MIRAPEX) 1 MG tablet TAKE  1 TABLET BY MOUTH EVERYDAY AT BEDTIME   promethazine (PHENERGAN) 25 MG tablet Take 1 tablet (25 mg total) by mouth every 6 (six) hours as needed for nausea or vomiting.   sertraline (ZOLOFT) 100 MG tablet TAKE 2 TABLETS BY MOUTH EVERY DAY   spironolactone (ALDACTONE) 25 MG tablet TAKE 1 TABLET BY MOUTH EVERY DAY   VITAMIN D-VITAMIN K PO Take 1 tablet by mouth daily.   No facility-administered encounter medications on file as of 08/20/2021.    Contacted [name] for General Review Call   Chart Review:  Have there been any documented new, changed, or discontinued medications since last visit? No  Has there been any documented recent hospitalizations or ED visits since last visit with Clinical Pharmacist? No Brief Summary (including medication and/or Diagnosis changes):None   Adherence Review:  Does the Clinical Pharmacist Assistant have access to adherence rates? Yes Adherence rates for STAR metric medications (List medication(s)/day supply/ last 2 fill dates).Atorvastatin 10mg  07/28/20 90ds, 04/07/20 90ds Adherence rates for medications indicated for disease state being reviewed (List medication(s)/day supply/ last 2 fill dates). Does the patient have >5 day gap between last estimated fill dates for any of the above medications or other medication gaps? No Reason for medication gaps.None   Disease State Questions:  Able to connect with Patient? Yes Did patient have any problems with their health recently? No Note problems and Concerns: Have you had any admissions or emergency  room visits or worsening of your condition(s) since last visit? No Details of ED visit, hospital visit and/or worsening condition(s): Have you had any visits with new specialists or providers since your last visit? No Explain: Have you had any new health care problem(s) since your last visit? No New problem(s) reported: Have you run out of any of your medications since you last spoke with clinical  pharmacist? No What caused you to run out of your medications? Are there any medications you are not taking as prescribed? No What kept you from taking your medications as prescribed? Are you having any issues or side effects with your medications? No Note of issues or side effects: Do you have any other health concerns or questions you want to discuss with your Clinical Pharmacist before your next visit? No Note additional concerns and questions from Patient. Are there any health concerns that you feel we can do a better job addressing? No Note Patient's response. Are you having any problems with any of the following since the last visit: (select all that apply)  None  Details: 12. Any falls since last visit? No  Details: 13. Any increased or uncontrolled pain since last visit? No  Details: 14. Next visit Type: office       Visit with:Dr. Cox        Date:08/26/21        Time:7:30am  15. Additional Details? No   [name], [facility] Programme researcher, broadcasting/film/video  [phone]

## 2021-08-21 ENCOUNTER — Other Ambulatory Visit: Payer: Self-pay | Admitting: Family Medicine

## 2021-08-26 ENCOUNTER — Encounter: Payer: Self-pay | Admitting: Family Medicine

## 2021-08-26 ENCOUNTER — Ambulatory Visit (INDEPENDENT_AMBULATORY_CARE_PROVIDER_SITE_OTHER): Payer: Medicare Other | Admitting: Family Medicine

## 2021-08-26 VITALS — BP 92/58 | HR 84 | Temp 97.3°F | Resp 18 | Ht 63.5 in | Wt 108.0 lb

## 2021-08-26 DIAGNOSIS — F330 Major depressive disorder, recurrent, mild: Secondary | ICD-10-CM

## 2021-08-26 DIAGNOSIS — J9611 Chronic respiratory failure with hypoxia: Secondary | ICD-10-CM

## 2021-08-26 DIAGNOSIS — G2581 Restless legs syndrome: Secondary | ICD-10-CM

## 2021-08-26 DIAGNOSIS — J449 Chronic obstructive pulmonary disease, unspecified: Secondary | ICD-10-CM

## 2021-08-26 DIAGNOSIS — G8929 Other chronic pain: Secondary | ICD-10-CM

## 2021-08-26 DIAGNOSIS — G894 Chronic pain syndrome: Secondary | ICD-10-CM | POA: Diagnosis not present

## 2021-08-26 DIAGNOSIS — E782 Mixed hyperlipidemia: Secondary | ICD-10-CM | POA: Diagnosis not present

## 2021-08-26 DIAGNOSIS — J441 Chronic obstructive pulmonary disease with (acute) exacerbation: Secondary | ICD-10-CM | POA: Diagnosis not present

## 2021-08-26 DIAGNOSIS — M545 Low back pain, unspecified: Secondary | ICD-10-CM | POA: Diagnosis not present

## 2021-08-26 MED ORDER — PREDNISONE 10 MG PO TABS: ORAL_TABLET | ORAL | 0 refills | Status: AC

## 2021-08-26 MED ORDER — TRIAMCINOLONE ACETONIDE 40 MG/ML IJ SUSP
80.0000 mg | Freq: Once | INTRAMUSCULAR | Status: AC
  Administered 2021-08-26: 80 mg via INTRAMUSCULAR

## 2021-08-26 MED ORDER — CLONAZEPAM 1 MG PO TABS: 1.0000 mg | ORAL_TABLET | Freq: Two times a day (BID) | ORAL | 1 refills | Status: DC | PRN

## 2021-08-26 MED ORDER — ATORVASTATIN CALCIUM 10 MG PO TABS: 10.0000 mg | ORAL_TABLET | Freq: Every day | ORAL | 1 refills | Status: DC

## 2021-08-26 MED ORDER — BENZONATATE 200 MG PO CAPS: 200.0000 mg | ORAL_CAPSULE | Freq: Three times a day (TID) | ORAL | 0 refills | Status: DC | PRN

## 2021-08-26 MED ORDER — AZITHROMYCIN 250 MG PO TABS: ORAL_TABLET | ORAL | 0 refills | Status: DC

## 2021-08-26 NOTE — Patient Instructions (Addendum)
COPD exacerbation: Z-Pak, prednisone. Kenalog shot given.

## 2021-08-26 NOTE — Progress Notes (Signed)
Subjective:  Patient ID: Ellen Holloway, female    DOB: 24-Oct-1950  Age: 71 y.o. MRN: [id number]  Chief Complaint  Patient presents with   COPD    HPI Patient is a 71 year old white female with past medical history significant for hyperlipidemia, COPD, chronic respiratory failure at night, chronic pain syndrome, and GERD.  Hyperlipidemia: Currently on Lipitor 10 mg once daily.   Lab Results  Component Value Date   CHOL 151 08/26/2021   CHOL 153 02/25/2021   CHOL 137 08/13/2020   Lab Results  Component Value Date   HDL 84 08/26/2021   HDL 80 02/25/2021   HDL 67 08/13/2020   Lab Results  Component Value Date   LDLCALC 53 08/26/2021   LDLCALC 59 02/25/2021   LDLCALC 56 08/13/2020   Lab Results  Component Value Date   TRIG 71 08/26/2021   TRIG 75 02/25/2021   TRIG 70 08/13/2020   Lab Results  Component Value Date   CHOLHDL 1.8 08/26/2021   CHOLHDL 1.9 02/25/2021   CHOLHDL 2.0 08/13/2020   No results found for: "LDLDIRECT"   GERD/Barrett's: Currently on Pepcid 40 mg two at night. GI discontinued Protonix 40 mg once daily..  Well-controlled. Has EGDs every 3 years.   COPD: Currently on Trelegy 1 puff daily and albuterol HFA/nebulizers has been using albuterol about twice a week. Oxygen 2 L PNC at night. Dr. Blenda Nicely is her pulmonary. OSA: no longer has due to weight loss. No longer needs cpap.    Chronic pain syndrome: Currently sees pain clinic is on Percocet 10/325 4 times a day as needed.  Patient has chronic back pain related to a compression fraction.  Patient was pressure washing her house and got tangled in hose and fell. Hit her knee.   Restless Leg syndrome: Mirapex 1 mg once at night works well.  Osteoporosis: Patient is currently on Evenity.  Last shot was Aug 04, 2021.  Depression with anxiety: Well-controlled on Zoloft 100 mg 2 daily.  Has clonazepam 1 mg twice daily as needed.  She requested a refill.      08/26/2021    7:36 AM 03/17/2021    5:22  PM 12/26/2020   10:03 AM  PHQ9 SCORE ONLY  PHQ-9 Total Score 0 0 0   HPI reviewed and updated.  Current Outpatient Medications on File Prior to Visit  Medication Sig Dispense Refill   albuterol (VENTOLIN HFA) 108 (90 Base) MCG/ACT inhaler Inhale 2 puffs into the lungs every 6 (six) hours as needed for wheezing or shortness of breath. 8 g 0   calcium carbonate (OSCAL) 1500 (600 Ca) MG TABS tablet Take 600 mg of elemental calcium by mouth daily.     cyclobenzaprine (FLEXERIL) 10 MG tablet TAKE 1 TABLET BY MOUTH EVERYDAY AT BEDTIME 30 tablet 2   famotidine (PEPCID) 40 MG tablet TAKE 1 TABLET BY MOUTH EVERYDAY AT BEDTIME (Patient taking differently: Take 80 mg by mouth at bedtime.) 90 tablet 2   feeding supplement (BOOST HIGH PROTEIN) LIQD Take 1 Container by mouth daily.     Fluticasone-Umeclidin-Vilant (TRELEGY ELLIPTA) 100-62.5-25 MCG/INH AEPB Inhale 1 puff into the lungs daily as needed.     furosemide (LASIX) 20 MG tablet TAKE 1 TABLET (20 MG TOTAL) BY MOUTH DAILY AS NEEDED FOR SWELLING 90 tablet 0   ipratropium (ATROVENT) 0.06 % nasal spray 2 sprays each nostril every 6 hours 15 mL 5   oxyCODONE-acetaminophen (PERCOCET) 10-325 MG tablet Take 1 tablet by mouth every  6 (six) hours.     OXYGEN Inhale 3 L into the lungs. 2 L at night.     pramipexole (MIRAPEX) 1 MG tablet TAKE 1 TABLET BY MOUTH EVERYDAY AT BEDTIME 90 tablet 1   promethazine (PHENERGAN) 25 MG tablet Take 1 tablet (25 mg total) by mouth every 6 (six) hours as needed for nausea or vomiting. 30 tablet 1   sertraline (ZOLOFT) 100 MG tablet TAKE 2 TABLETS BY MOUTH EVERY DAY 180 tablet 1   spironolactone (ALDACTONE) 25 MG tablet TAKE 1 TABLET BY MOUTH EVERY DAY 90 tablet 1   VITAMIN D-VITAMIN K PO Take 1 tablet by mouth daily.     No current facility-administered medications on file prior to visit.   Past Medical History:  Diagnosis Date   Acquired absence of both cervix and uterus    Age-related osteoporosis with current  pathological fracture, unspecified site, initial encounter for fracture    Asthma    Barrett's esophagus with low grade dysplasia    Chronic obstructive pulmonary disease (COPD) (HCC)    Major depressive disorder, recurrent severe without psychotic features (HCC)    Mixed hyperlipidemia    Obstructive sleep apnea (adult) (pediatric)    Other obesity due to excess calories    Other pulmonary embolism without acute cor pulmonale (HCC)    Other specified anxiety disorders    Sleep related leg cramps    Telogen effluvium    Urticaria    Past Surgical History:  Procedure Laterality Date   ABDOMINAL HYSTERECTOMY  1980   partial   right wrist 1985      Family History  Problem Relation Age of Onset   Obesity Mother    Heart attack Father    Allergic rhinitis Sister    Asthma Brother    Renal Disease Brother    Social History   Socioeconomic History   Marital status: Married    Spouse name: Mellody Dance   Number of children: 2   Years of education: Not on file   Highest education level: Not on file  Occupational History   Not on file  Tobacco Use   Smoking status: Former    Packs/day: 0.50    Years: 55.00    Total pack years: 27.50    Types: E-cigarettes, Cigarettes   Smokeless tobacco: Never   Tobacco comments:    Patient used to smoke up to 2 PPD.  Substance and Sexual Activity   Alcohol use: Yes    Alcohol/week: 1.0 standard drink of alcohol    Types: 1 Cans of beer per week    Comment: occasional beer   Drug use: Never   Sexual activity: Not on file  Other Topics Concern   Not on file  Social History Narrative   Not on file   Social Determinants of Health   Financial Resource Strain: Low Risk  (12/18/2020)   Overall Financial Resource Strain (CARDIA)    Difficulty of Paying Living Expenses: Not hard at all  Food Insecurity: No Food Insecurity (12/26/2020)   Hunger Vital Sign    Worried About Running Out of Food in the Last Year: Never true    Ran Out of Food in  the Last Year: Never true  Transportation Needs: No Transportation Needs (12/18/2020)   PRAPARE - Administrator, Civil Service (Medical): No    Lack of Transportation (Non-Medical): No  Physical Activity: Insufficiently Active (10/12/2019)   Exercise Vital Sign    Days of Exercise per Week:  3 days    Minutes of Exercise per Session: 30 min  Stress: Not on file  Social Connections: Not on file   URI symptoms x 1 week.   Review of Systems  Constitutional:  Negative for chills, fatigue and fever.  HENT:  Positive for congestion. Negative for ear pain, rhinorrhea and sore throat.   Respiratory:  Positive for cough (yellow productive), shortness of breath and wheezing.   Cardiovascular:  Negative for chest pain.  Gastrointestinal:  Positive for nausea. Negative for abdominal pain, constipation, diarrhea and vomiting.  Genitourinary:  Negative for dysuria and urgency.  Musculoskeletal:  Positive for back pain. Negative for myalgias.  Neurological:  Negative for dizziness, weakness, light-headedness and headaches.       Balance issues  Psychiatric/Behavioral:  Negative for dysphoric mood. The patient is not nervous/anxious.      Objective:  BP (!) 92/58   Pulse 84   Temp (!) 97.3 F (36.3 C)   Resp 18   Ht 5' 3.5" (1.613 m)   Wt 108 lb (49 kg)   BMI 18.83 kg/m      08/26/2021    7:37 AM 04/21/2021    4:39 PM 04/07/2021    3:13 PM  BP/Weight  Systolic BP 92 110 130  Diastolic BP 58 70 70  Wt. (Lbs) 108 110 112  BMI 18.83 kg/m2 19.18 kg/m2 19.53 kg/m2    Physical Exam Vitals reviewed.  Constitutional:      Appearance: Normal appearance.  HENT:     Right Ear: Tympanic membrane, ear canal and external ear normal.     Left Ear: Tympanic membrane, ear canal and external ear normal.     Nose: Rhinorrhea present.     Mouth/Throat:     Pharynx: Oropharynx is clear.  Cardiovascular:     Rate and Rhythm: Normal rate and regular rhythm.     Heart sounds: Normal heart  sounds. No murmur heard. Pulmonary:     Effort: Pulmonary effort is normal. No respiratory distress.     Breath sounds: Wheezing present.     Comments: Decreased air exchange.  Lymphadenopathy:     Cervical: No cervical adenopathy.  Neurological:     Mental Status: She is alert and oriented to person, place, and time.  Psychiatric:        Mood and Affect: Mood normal.        Behavior: Behavior normal.    Diabetic Foot Exam - Simple   No data filed      Lab Results  Component Value Date   WBC 5.7 08/26/2021   HGB 13.4 08/26/2021   HCT 39.3 08/26/2021   PLT 201 08/26/2021   GLUCOSE 94 08/26/2021   CHOL 151 08/26/2021   TRIG 71 08/26/2021   HDL 84 08/26/2021   LDLCALC 53 08/26/2021   ALT 16 08/26/2021   AST 20 08/26/2021   NA 141 08/26/2021   K 4.4 08/26/2021   CL 101 08/26/2021   CREATININE 0.80 08/26/2021   BUN 13 08/26/2021   CO2 24 08/26/2021   TSH 1.860 08/26/2021      Assessment & Plan:   Problem List Items Addressed This Visit       Respiratory   Chronic respiratory failure with hypoxia (HCC) - Primary    Continue Oxygen 2 L PNC at night.      COPD exacerbation (HCC)    Z-Pak, prednisone. Kenalog shot given.      Relevant Medications   azithromycin (ZITHROMAX) 250 MG tablet  predniSONE (DELTASONE) 10 MG tablet     Other   Depression, major, recurrent, mild (HCC)    The current medical regimen is effective;  continue present plan and medications. Continue Zoloft 100 mg 2 daily.  Has clonazepam 1 mg twice daily as needed.        Relevant Medications   clonazePAM (KLONOPIN) 1 MG tablet   RLS (restless legs syndrome)    The current medical regimen is effective;  continue present plan and medications. Continue mirapex 1 mg once daily before bed.       Chronic midline low back pain without sciatica    Management per specialist.        Relevant Medications   predniSONE (DELTASONE) 10 MG tablet   clonazePAM (KLONOPIN) 1 MG tablet   Mixed  hyperlipidemia    Well controlled.  No changes to medicines. Continue Lipitor 10 mg once daily.  Continue to work on eating a healthy diet and exercise.  Labs drawn today.        Relevant Medications   atorvastatin (LIPITOR) 10 MG tablet   Other Relevant Orders   Lipid panel (Completed)   Comprehensive metabolic panel (Completed)   CBC with Differential/Platelet (Completed)   TSH (Completed)   Chronic pain syndrome    Management per specialist.        Relevant Medications   predniSONE (DELTASONE) 10 MG tablet   clonazePAM (KLONOPIN) 1 MG tablet  . Total time spent on today's visit was greater than 30 minutes, including both face-to-face time and nonface-to-face time personally spent on review of chart (labs and imaging), discussing labs and goals, discussing further work-up, treatment options, referrals to specialist if needed, reviewing outside records of pertinent, answering patient's questions, and coordinating care.   Follow-up: Return in about 3 months (around 11/26/2021) for chronic follow up.  An After Visit Summary was printed and given to the patient.  Blane OharaKirsten Remedios Mckone, MD [facility] [phone]

## 2021-08-27 LAB — CBC WITH DIFFERENTIAL/PLATELET
Basophils Absolute: 0 10*3/uL (ref 0.0–0.2)
Basos: 1 %
EOS (ABSOLUTE): 0.2 10*3/uL (ref 0.0–0.4)
Eos: 3 %
Hematocrit: 39.3 % (ref 34.0–46.6)
Hemoglobin: 13.4 g/dL (ref 11.1–15.9)
Immature Grans (Abs): 0 10*3/uL (ref 0.0–0.1)
Immature Granulocytes: 0 %
Lymphocytes Absolute: 2 10*3/uL (ref 0.7–3.1)
Lymphs: 35 %
MCH: 31.5 pg (ref 26.6–33.0)
MCHC: 34.1 g/dL (ref 31.5–35.7)
MCV: 93 fL (ref 79–97)
Monocytes Absolute: 0.6 10*3/uL (ref 0.1–0.9)
Monocytes: 11 %
Neutrophils Absolute: 2.9 10*3/uL (ref 1.4–7.0)
Neutrophils: 50 %
Platelets: 201 10*3/uL (ref 150–450)
RBC: 4.25 x10E6/uL (ref 3.77–5.28)
RDW: 12.3 % (ref 11.7–15.4)
WBC: 5.7 10*3/uL (ref 3.4–10.8)

## 2021-08-27 LAB — COMPREHENSIVE METABOLIC PANEL
ALT: 16 IU/L (ref 0–32)
AST: 20 IU/L (ref 0–40)
Albumin/Globulin Ratio: 1.8 (ref 1.2–2.2)
Albumin: 4.3 g/dL (ref 3.7–4.7)
Alkaline Phosphatase: 96 IU/L (ref 44–121)
BUN/Creatinine Ratio: 16 (ref 12–28)
BUN: 13 mg/dL (ref 8–27)
Bilirubin Total: 0.2 mg/dL (ref 0.0–1.2)
CO2: 24 mmol/L (ref 20–29)
Calcium: 9.6 mg/dL (ref 8.7–10.3)
Chloride: 101 mmol/L (ref 96–106)
Creatinine, Ser: 0.8 mg/dL (ref 0.57–1.00)
Globulin, Total: 2.4 g/dL (ref 1.5–4.5)
Glucose: 94 mg/dL (ref 70–99)
Potassium: 4.4 mmol/L (ref 3.5–5.2)
Sodium: 141 mmol/L (ref 134–144)
Total Protein: 6.7 g/dL (ref 6.0–8.5)
eGFR: 78 mL/min/{1.73_m2} (ref >59)

## 2021-08-27 LAB — LIPID PANEL
Chol/HDL Ratio: 1.8 ratio (ref 0.0–4.4)
Cholesterol, Total: 151 mg/dL (ref 100–199)
HDL: 84 mg/dL (ref >39)
LDL Chol Calc (NIH): 53 mg/dL (ref 0–99)
Triglycerides: 71 mg/dL (ref 0–149)
VLDL Cholesterol Cal: 14 mg/dL (ref 5–40)

## 2021-08-27 LAB — TSH: TSH: 1.86 u[IU]/mL (ref 0.450–4.500)

## 2021-08-30 NOTE — Assessment & Plan Note (Signed)
Management per specialist. 

## 2021-08-30 NOTE — Assessment & Plan Note (Signed)
Continue Oxygen 2 L PNC at night.

## 2021-08-30 NOTE — Assessment & Plan Note (Signed)
The current medical regimen is effective;  continue present plan and medications. Continue Zoloft 100 mg 2 daily.  Has clonazepam 1 mg twice daily as needed.

## 2021-08-30 NOTE — Assessment & Plan Note (Addendum)
Z-Pak, prednisone. Kenalog shot given.

## 2021-08-30 NOTE — Assessment & Plan Note (Signed)
Well controlled.  No changes to medicines. Continue Lipitor 10 mg once daily.  Continue to work on eating a healthy diet and exercise.  Labs drawn today.

## 2021-08-30 NOTE — Assessment & Plan Note (Signed)
The current medical regimen is effective;  continue present plan and medications. Continue mirapex 1 mg once daily before bed.

## 2021-09-08 ENCOUNTER — Ambulatory Visit (INDEPENDENT_AMBULATORY_CARE_PROVIDER_SITE_OTHER): Payer: Medicare Other

## 2021-09-08 DIAGNOSIS — M810 Age-related osteoporosis without current pathological fracture: Secondary | ICD-10-CM

## 2021-09-08 MED ORDER — ROMOSOZUMAB-AQQG 105 MG/1.17ML ~~LOC~~ SOSY
210.0000 mg | PREFILLED_SYRINGE | Freq: Once | SUBCUTANEOUS | Status: AC
  Administered 2021-09-08: 210 mg via SUBCUTANEOUS

## 2021-09-08 NOTE — Progress Notes (Signed)
   Evenity injection (105 mg in each arm) given per order, patient tolerated well.   Jacklynn Bue, LPN 7:82 AM

## 2021-09-11 ENCOUNTER — Other Ambulatory Visit: Payer: Self-pay | Admitting: Family Medicine

## 2021-09-11 DIAGNOSIS — F321 Major depressive disorder, single episode, moderate: Secondary | ICD-10-CM

## 2021-10-09 ENCOUNTER — Ambulatory Visit (INDEPENDENT_AMBULATORY_CARE_PROVIDER_SITE_OTHER): Payer: Medicare Other

## 2021-10-09 DIAGNOSIS — M810 Age-related osteoporosis without current pathological fracture: Secondary | ICD-10-CM

## 2021-10-09 MED ORDER — ROMOSOZUMAB-AQQG 105 MG/1.17ML ~~LOC~~ SOSY
210.0000 mg | PREFILLED_SYRINGE | Freq: Once | SUBCUTANEOUS | Status: AC
  Administered 2021-10-09: 210 mg via SUBCUTANEOUS

## 2021-10-09 NOTE — Progress Notes (Signed)
Patient came for [place] shot. I gave one shot in each Deltoid area. Patient tolerate well both injections.

## 2021-10-12 ENCOUNTER — Other Ambulatory Visit: Payer: Self-pay | Admitting: Physician Assistant

## 2021-10-20 ENCOUNTER — Other Ambulatory Visit: Payer: Self-pay | Admitting: Family Medicine

## 2021-11-10 ENCOUNTER — Ambulatory Visit (INDEPENDENT_AMBULATORY_CARE_PROVIDER_SITE_OTHER): Payer: Medicare Other

## 2021-11-10 DIAGNOSIS — M810 Age-related osteoporosis without current pathological fracture: Secondary | ICD-10-CM | POA: Diagnosis not present

## 2021-11-10 MED ORDER — ROMOSOZUMAB-AQQG 105 MG/1.17ML ~~LOC~~ SOSY
210.0000 mg | PREFILLED_SYRINGE | Freq: Once | SUBCUTANEOUS | Status: AC
  Administered 2021-11-10: 210 mg via SUBCUTANEOUS

## 2021-11-10 NOTE — Progress Notes (Signed)
   Evenity injection given per order, patient tolerated well.   Jacklynn Bue, LPN 6:28 AM

## 2021-11-26 ENCOUNTER — Other Ambulatory Visit: Payer: Self-pay | Admitting: Family Medicine

## 2021-12-03 ENCOUNTER — Ambulatory Visit: Payer: Medicare Other | Admitting: Family Medicine

## 2021-12-08 ENCOUNTER — Encounter: Payer: Self-pay | Admitting: Family Medicine

## 2021-12-10 ENCOUNTER — Encounter: Payer: Self-pay | Admitting: Family Medicine

## 2021-12-10 ENCOUNTER — Ambulatory Visit (INDEPENDENT_AMBULATORY_CARE_PROVIDER_SITE_OTHER): Payer: Medicare Other | Admitting: Family Medicine

## 2021-12-10 VITALS — BP 104/60 | HR 88 | Temp 97.3°F | Resp 16 | Ht 63.0 in | Wt 102.2 lb

## 2021-12-10 DIAGNOSIS — E782 Mixed hyperlipidemia: Secondary | ICD-10-CM | POA: Diagnosis not present

## 2021-12-10 DIAGNOSIS — Z23 Encounter for immunization: Secondary | ICD-10-CM | POA: Diagnosis not present

## 2021-12-10 DIAGNOSIS — G894 Chronic pain syndrome: Secondary | ICD-10-CM | POA: Diagnosis not present

## 2021-12-10 DIAGNOSIS — J9611 Chronic respiratory failure with hypoxia: Secondary | ICD-10-CM

## 2021-12-10 DIAGNOSIS — M810 Age-related osteoporosis without current pathological fracture: Secondary | ICD-10-CM

## 2021-12-10 DIAGNOSIS — R252 Cramp and spasm: Secondary | ICD-10-CM

## 2021-12-10 DIAGNOSIS — G2581 Restless legs syndrome: Secondary | ICD-10-CM

## 2021-12-10 DIAGNOSIS — J410 Simple chronic bronchitis: Secondary | ICD-10-CM

## 2021-12-10 DIAGNOSIS — K219 Gastro-esophageal reflux disease without esophagitis: Secondary | ICD-10-CM

## 2021-12-10 DIAGNOSIS — F330 Major depressive disorder, recurrent, mild: Secondary | ICD-10-CM

## 2021-12-10 NOTE — Progress Notes (Signed)
Subjective:  Patient ID: Ellen Holloway, female    DOB: 02/06/1951  Age: 71 y.o. MRN: [id number]  Chief Complaint  Patient presents with   COPD   Gastroesophageal Reflux   Hyperlipidemia    HPI Patient is a 71 year old white female with past medical history significant for hyperlipidemia, COPD, chronic respiratory failure at night, chronic pain syndrome, and GERD.  COPD:  Albuterol inhaler as needed, Trelegy daily, Oxygen 2L/minute at bedtime.    Hyperlipidemia: Currently on Lipitor 10 mg once daily.   GERD:  Pepcid 40 mg twice daily.  Osteoporosis: on evenity 2 shots monthly.    RLS: on mirapex. Still has muscle cramps.  Depression: On zoloft 100 mg 2 daily, clonazepam 1 mg twice daily as needed.     12/10/2021   10:44 AM 12/10/2021   10:02 AM 08/26/2021    7:36 AM  PHQ9 SCORE ONLY  PHQ-9 Total Score 7 0 0    Chronic pain syndrome: patient is seen at the pain clinic. She is on percocet, flexeril.   Current Outpatient Medications on File Prior to Visit  Medication Sig Dispense Refill   albuterol (VENTOLIN HFA) 108 (90 Base) MCG/ACT inhaler Inhale 2 puffs into the lungs every 6 (six) hours as needed for wheezing or shortness of breath. 8 g 0   atorvastatin (LIPITOR) 10 MG tablet Take 1 tablet (10 mg total) by mouth daily. 90 tablet 1   calcium carbonate (OSCAL) 1500 (600 Ca) MG TABS tablet Take 600 mg of elemental calcium by mouth daily.     clonazePAM (KLONOPIN) 1 MG tablet Take 1 tablet (1 mg total) by mouth 2 (two) times daily as needed for anxiety. 30 tablet 1   cyclobenzaprine (FLEXERIL) 10 MG tablet TAKE 1 TABLET BY MOUTH EVERYDAY AT BEDTIME 30 tablet 0   famotidine (PEPCID) 40 MG tablet TAKE 1 TABLET BY MOUTH EVERYDAY AT BEDTIME (Patient taking differently: Take 80 mg by mouth at bedtime.) 90 tablet 2   feeding supplement (BOOST HIGH PROTEIN) LIQD Take 1 Container by mouth in the morning and at bedtime.     Fluticasone-Umeclidin-Vilant (TRELEGY ELLIPTA) 100-62.5-25  MCG/INH AEPB Inhale 1 puff into the lungs daily as needed.     furosemide (LASIX) 20 MG tablet TAKE 1 TABLET (20 MG TOTAL) BY MOUTH DAILY AS NEEDED FOR SWELLING 90 tablet 0   ipratropium (ATROVENT) 0.06 % nasal spray 2 sprays each nostril every 6 hours 15 mL 5   oxyCODONE-acetaminophen (PERCOCET) 10-325 MG tablet Take 1 tablet by mouth every 6 (six) hours.     OXYGEN Inhale 3 L into the lungs. 2 L at night.     pramipexole (MIRAPEX) 1 MG tablet TAKE 1 TABLET BY MOUTH EVERYDAY AT BEDTIME 90 tablet 1   promethazine (PHENERGAN) 25 MG tablet Take 1 tablet (25 mg total) by mouth every 6 (six) hours as needed for nausea or vomiting. 30 tablet 1   sertraline (ZOLOFT) 100 MG tablet TAKE 2 TABLETS BY MOUTH EVERY DAY 180 tablet 1   spironolactone (ALDACTONE) 25 MG tablet TAKE 1 TABLET BY MOUTH EVERY DAY 90 tablet 1   VITAMIN D-VITAMIN K PO Take 1 tablet by mouth daily.     No current facility-administered medications on file prior to visit.   Past Medical History:  Diagnosis Date   Acquired absence of both cervix and uterus    Age-related osteoporosis with current pathological fracture, unspecified site, initial encounter for fracture    Asthma    Barrett's esophagus with  low grade dysplasia    Chronic obstructive pulmonary disease (COPD) (HCC)    Major depressive disorder, recurrent severe without psychotic features (HCC)    Mixed hyperlipidemia    Obstructive sleep apnea (adult) (pediatric)    Other obesity due to excess calories    Other pulmonary embolism without acute cor pulmonale (HCC)    Other specified anxiety disorders    Sleep related leg cramps    Telogen effluvium    Urticaria    Past Surgical History:  Procedure Laterality Date   ABDOMINAL HYSTERECTOMY  1980   partial   right wrist 1985      Family History  Problem Relation Age of Onset   Obesity Mother    Heart attack Father    Allergic rhinitis Sister    Asthma Brother    Renal Disease Brother    Social History    Socioeconomic History   Marital status: Married    Spouse name: Mellody Dance   Number of children: 2   Years of education: Not on file   Highest education level: Not on file  Occupational History   Not on file  Tobacco Use   Smoking status: Former    Packs/day: 0.50    Years: 55.00    Total pack years: 27.50    Types: E-cigarettes, Cigarettes   Smokeless tobacco: Never   Tobacco comments:    Patient used to smoke up to 2 PPD.  Substance and Sexual Activity   Alcohol use: Yes    Alcohol/week: 1.0 standard drink of alcohol    Types: 1 Cans of beer per week    Comment: occasional beer   Drug use: Never   Sexual activity: Not on file  Other Topics Concern   Not on file  Social History Narrative   Not on file   Social Determinants of Health   Financial Resource Strain: Low Risk  (12/18/2020)   Overall Financial Resource Strain (CARDIA)    Difficulty of Paying Living Expenses: Not hard at all  Food Insecurity: No Food Insecurity (12/26/2020)   Hunger Vital Sign    Worried About Running Out of Food in the Last Year: Never true    Ran Out of Food in the Last Year: Never true  Transportation Needs: No Transportation Needs (12/18/2020)   PRAPARE - Administrator, Civil Service (Medical): No    Lack of Transportation (Non-Medical): No  Physical Activity: Insufficiently Active (10/12/2019)   Exercise Vital Sign    Days of Exercise per Week: 3 days    Minutes of Exercise per Session: 30 min  Stress: Not on file  Social Connections: Not on file    Review of Systems  Constitutional:  Negative for chills, fatigue and fever.  HENT:  Positive for postnasal drip. Negative for congestion, rhinorrhea and sore throat.   Respiratory:  Positive for cough, chest tightness, shortness of breath and wheezing.   Cardiovascular:  Negative for chest pain.  Gastrointestinal:  Negative for abdominal pain, constipation, diarrhea, nausea and vomiting.  Genitourinary:  Negative for dysuria  and urgency.  Musculoskeletal:  Negative for back pain and myalgias.  Neurological:  Positive for headaches. Negative for dizziness, weakness and light-headedness.  Psychiatric/Behavioral:  Negative for dysphoric mood. The patient is not nervous/anxious.      Objective:  BP 104/60   Pulse 88   Temp (!) 97.3 F (36.3 C)   Resp 16   Ht 5\' 3"  (1.6 m)   Wt 102 lb 3.2 oz (46.4  kg)   BMI 18.10 kg/m      12/10/2021    9:56 AM 08/26/2021    7:37 AM 04/21/2021    4:39 PM  BP/Weight  Systolic BP 104 92 110  Diastolic BP 60 58 70  Wt. (Lbs) 102.2 108 110  BMI 18.1 kg/m2 18.83 kg/m2 19.18 kg/m2    Physical Exam Vitals reviewed.  Constitutional:      Appearance: Normal appearance.     Comments: thin  Neck:     Vascular: No carotid bruit.  Cardiovascular:     Rate and Rhythm: Normal rate and regular rhythm.     Heart sounds: Normal heart sounds.  Pulmonary:     Effort: Pulmonary effort is normal. No respiratory distress.     Breath sounds: Normal breath sounds.  Abdominal:     General: Abdomen is flat. Bowel sounds are normal.     Palpations: Abdomen is soft.     Tenderness: There is no abdominal tenderness.  Neurological:     Mental Status: She is alert and oriented to person, place, and time.  Psychiatric:        Mood and Affect: Mood normal.        Behavior: Behavior normal.     Diabetic Foot Exam - Simple   No data filed      Lab Results  Component Value Date   WBC 5.7 08/26/2021   HGB 13.4 08/26/2021   HCT 39.3 08/26/2021   PLT 201 08/26/2021   GLUCOSE 94 08/26/2021   CHOL 151 08/26/2021   TRIG 71 08/26/2021   HDL 84 08/26/2021   LDLCALC 53 08/26/2021   ALT 16 08/26/2021   AST 20 08/26/2021   NA 141 08/26/2021   K 4.4 08/26/2021   CL 101 08/26/2021   CREATININE 0.80 08/26/2021   BUN 13 08/26/2021   CO2 24 08/26/2021   TSH 1.860 08/26/2021      Assessment & Plan:   Problem List Items Addressed This Visit       Respiratory   Chronic  respiratory failure with hypoxia (HCC)    Continue Oxygen 2L/minute at bedtime      COPD (chronic obstructive pulmonary disease) (HCC)    Stable.  The current medical regimen is effective;  continue present plan and medications. Continue trelegy and albuterol as needed.         Digestive   GERD (gastroesophageal reflux disease)    The current medical regimen is effective;  continue present plan and medications. Continue pepcid 40 mg twice daily.          Musculoskeletal and Integument   Age-related osteoporosis without current pathological fracture    Continue evenity.         Other   Depression, major, recurrent, mild (HCC)    The current medical regimen is effective;  continue present plan and medications.       RLS (restless legs syndrome)    The current medical regimen is effective;  continue present plan and medications.       Mixed hyperlipidemia    Well-controlled Continue Current medication Continue Heart healthy diet and exercise       Chronic pain syndrome    Management per specialist.        Need for influenza vaccination - Primary   Relevant Orders   Flu Vaccine QUAD High Dose(Fluad) (Completed)   Muscle cramps    Recommend trial of tsp of mustard.      .  No orders of  the defined types were placed in this encounter.   Orders Placed This Encounter  Procedures   Flu Vaccine QUAD High Dose(Fluad)     Follow-up: Return in about 4 weeks (around 01/07/2022) for awv.  An After Visit Summary was printed and given to the patient.  Blane Ohara, MD [facility] [phone]

## 2021-12-12 ENCOUNTER — Ambulatory Visit (INDEPENDENT_AMBULATORY_CARE_PROVIDER_SITE_OTHER): Payer: Medicare Other

## 2021-12-12 DIAGNOSIS — M810 Age-related osteoporosis without current pathological fracture: Secondary | ICD-10-CM | POA: Diagnosis not present

## 2021-12-12 MED ORDER — ROMOSOZUMAB-AQQG 105 MG/1.17ML ~~LOC~~ SOSY
210.0000 mg | PREFILLED_SYRINGE | Freq: Once | SUBCUTANEOUS | Status: AC
  Administered 2021-12-12: 210 mg via SUBCUTANEOUS

## 2021-12-12 NOTE — Assessment & Plan Note (Signed)
Well-controlled Continue Current medication Continue Heart healthy diet and exercise

## 2021-12-12 NOTE — Assessment & Plan Note (Signed)
Continue Oxygen 2L/minute at bedtime

## 2021-12-13 DIAGNOSIS — R252 Cramp and spasm: Secondary | ICD-10-CM | POA: Insufficient documentation

## 2021-12-13 DIAGNOSIS — J449 Chronic obstructive pulmonary disease, unspecified: Secondary | ICD-10-CM | POA: Insufficient documentation

## 2021-12-13 DIAGNOSIS — K219 Gastro-esophageal reflux disease without esophagitis: Secondary | ICD-10-CM | POA: Insufficient documentation

## 2021-12-13 DIAGNOSIS — Z23 Encounter for immunization: Secondary | ICD-10-CM | POA: Insufficient documentation

## 2021-12-13 NOTE — Assessment & Plan Note (Signed)
Recommend trial of tsp of mustard.

## 2021-12-13 NOTE — Assessment & Plan Note (Signed)
The current medical regimen is effective;  continue present plan and medications.  

## 2021-12-13 NOTE — Assessment & Plan Note (Addendum)
The current medical regimen is effective;  continue present plan and medications. Continue pepcid 40 mg twice daily.

## 2021-12-13 NOTE — Assessment & Plan Note (Signed)
Management per specialist. 

## 2021-12-13 NOTE — Assessment & Plan Note (Signed)
Continue evenity.

## 2021-12-13 NOTE — Assessment & Plan Note (Signed)
Stable.  The current medical regimen is effective;  continue present plan and medications. Continue trelegy and albuterol as needed.

## 2021-12-15 ENCOUNTER — Encounter: Payer: Self-pay | Admitting: Family Medicine

## 2021-12-18 ENCOUNTER — Other Ambulatory Visit: Payer: Self-pay | Admitting: Family Medicine

## 2021-12-18 DIAGNOSIS — Z1231 Encounter for screening mammogram for malignant neoplasm of breast: Secondary | ICD-10-CM

## 2021-12-23 ENCOUNTER — Other Ambulatory Visit: Payer: Self-pay | Admitting: Physician Assistant

## 2022-01-07 ENCOUNTER — Ambulatory Visit
Admission: RE | Admit: 2022-01-07 | Discharge: 2022-01-07 | Disposition: A | Discharge status: Home / Self Care | Payer: Medicare Other | Source: Ambulatory Visit | Attending: Family Medicine | Admitting: Family Medicine

## 2022-01-07 DIAGNOSIS — Z1231 Encounter for screening mammogram for malignant neoplasm of breast: Secondary | ICD-10-CM

## 2022-01-09 ENCOUNTER — Encounter: Payer: Self-pay | Admitting: Family Medicine

## 2022-01-10 ENCOUNTER — Other Ambulatory Visit: Payer: Self-pay | Admitting: Family Medicine

## 2022-01-12 ENCOUNTER — Other Ambulatory Visit: Payer: Self-pay | Admitting: Family Medicine

## 2022-01-12 ENCOUNTER — Ambulatory Visit: Payer: Medicare Other

## 2022-01-12 ENCOUNTER — Ambulatory Visit (INDEPENDENT_AMBULATORY_CARE_PROVIDER_SITE_OTHER): Payer: Medicare Other

## 2022-01-12 VITALS — BP 108/64 | HR 80 | Ht 63.0 in | Wt 102.4 lb

## 2022-01-12 DIAGNOSIS — Z0000 Encounter for general adult medical examination without abnormal findings: Secondary | ICD-10-CM | POA: Diagnosis not present

## 2022-01-12 DIAGNOSIS — M810 Age-related osteoporosis without current pathological fracture: Secondary | ICD-10-CM

## 2022-01-12 DIAGNOSIS — Z23 Encounter for immunization: Secondary | ICD-10-CM | POA: Diagnosis not present

## 2022-01-12 DIAGNOSIS — Z09 Encounter for follow-up examination after completed treatment for conditions other than malignant neoplasm: Secondary | ICD-10-CM

## 2022-01-12 DIAGNOSIS — R928 Other abnormal and inconclusive findings on diagnostic imaging of breast: Secondary | ICD-10-CM

## 2022-01-12 MED ORDER — ROMOSOZUMAB-AQQG 105 MG/1.17ML ~~LOC~~ SOSY
210.0000 mg | PREFILLED_SYRINGE | Freq: Once | SUBCUTANEOUS | Status: AC
  Administered 2022-01-12: 210 mg via SUBCUTANEOUS

## 2022-01-12 NOTE — Progress Notes (Signed)
Subjective:   Ellen Holloway is a 71 y.o. female who presents for Medicare Annual (Subsequent) preventive examination.  This wellness visit is conducted by a nurse.  The patient's medications were reviewed and reconciled since the patient's last visit.  History details were provided by the patient.  The history appears to be reliable.    Medical History: Patient history and Family history was reviewed  Medications, Allergies, and preventative health maintenance was reviewed and updated.  Patient started Evenity Injections for osteoporosis management 05/28/21.  She is due for her next injection today.  She has tolerated the injections well thus far.  Ellen Holloway is underweight with a BMI of 18.14.  Despite her efforts she is having trouble gaining weight.  She drinks boost supplements daily between meals.  Cardiac Risk Factors include: advanced age (>71men, >68 women);smoking/ tobacco exposure     Objective:    Today's Vitals   01/12/22 1217  BP: 108/64  Pulse: 80  Weight: 102 lb 6.4 oz (46.4 kg)  Height: 5\' 3"  (1.6 m)   Body mass index is 18.14 kg/m.     12/26/2020   10:09 AM 08/30/2019    9:23 AM  Advanced Directives  Does Patient Have a Medical Advance Directive? No No  Would patient like information on creating a medical advance directive? Yes (MAU/Ambulatory/Procedural Areas - Information given) No - Patient declined    Current Medications (verified) Outpatient Encounter Medications as of 01/12/2022  Medication Sig   albuterol (VENTOLIN HFA) 108 (90 Base) MCG/ACT inhaler Inhale 2 puffs into the lungs every 6 (six) hours as needed for wheezing or shortness of breath.   atorvastatin (LIPITOR) 10 MG tablet Take 1 tablet (10 mg total) by mouth daily.   calcium carbonate (OSCAL) 1500 (600 Ca) MG TABS tablet Take 600 mg of elemental calcium by mouth daily.   clonazePAM (KLONOPIN) 1 MG tablet Take 1 tablet (1 mg total) by mouth 2 (two) times daily as needed for anxiety.    cyclobenzaprine (FLEXERIL) 10 MG tablet TAKE 1 TABLET BY MOUTH EVERYDAY AT BEDTIME   famotidine (PEPCID) 40 MG tablet TAKE 1 TABLET BY MOUTH EVERYDAY AT BEDTIME (Patient taking differently: Take 80 mg by mouth at bedtime.)   feeding supplement (BOOST HIGH PROTEIN) LIQD Take 1 Container by mouth in the morning and at bedtime.   Fluticasone-Umeclidin-Vilant (TRELEGY ELLIPTA) 100-62.5-25 MCG/INH AEPB Inhale 1 puff into the lungs daily as needed.   furosemide (LASIX) 20 MG tablet TAKE 1 TABLET (20 MG TOTAL) BY MOUTH DAILY AS NEEDED FOR SWELLING   ipratropium (ATROVENT) 0.06 % nasal spray 2 sprays each nostril every 6 hours   oxyCODONE-acetaminophen (PERCOCET) 10-325 MG tablet Take 1 tablet by mouth every 6 (six) hours.   OXYGEN Inhale 3 L into the lungs. 2 L at night.   pramipexole (MIRAPEX) 1 MG tablet TAKE 1 TABLET BY MOUTH EVERYDAY AT BEDTIME   promethazine (PHENERGAN) 25 MG tablet Take 1 tablet (25 mg total) by mouth every 6 (six) hours as needed for nausea or vomiting.   sertraline (ZOLOFT) 100 MG tablet TAKE 2 TABLETS BY MOUTH EVERY DAY   spironolactone (ALDACTONE) 25 MG tablet TAKE 1 TABLET BY MOUTH EVERY DAY   VITAMIN D-VITAMIN K PO Take 1 tablet by mouth daily.   [EXPIRED] Romosozumab-aqqg (EVENITY) 105 MG/1.04-14-1985 injection 210 mg    No facility-administered encounter medications on file as of 01/12/2022.    Allergies (verified) Sulfamethoxazole and Sulfa antibiotics   History: Past Medical History:  Diagnosis Date  Acquired absence of both cervix and uterus    Age-related osteoporosis with current pathological fracture, unspecified site, initial encounter for fracture    Asthma    Barrett's esophagus with low grade dysplasia    Chronic obstructive pulmonary disease (COPD) (HCC)    Major depressive disorder, recurrent severe without psychotic features (HCC)    Mixed hyperlipidemia    Obstructive sleep apnea (adult) (pediatric)    Other obesity due to excess calories    Other  pulmonary embolism without acute cor pulmonale (HCC)    Other specified anxiety disorders    Sleep related leg cramps    Telogen effluvium    Urticaria    Past Surgical History:  Procedure Laterality Date   ABDOMINAL HYSTERECTOMY  1980   partial   right wrist 1985     Family History  Problem Relation Age of Onset   Obesity Mother    Heart attack Father    Allergic rhinitis Sister    Asthma Brother    Renal Disease Brother    Breast cancer Neg Hx    Social History   Socioeconomic History   Marital status: Married    Spouse name: Ellen Holloway   Number of children: 2   Years of education: Not on file   Highest education level: Not on file  Occupational History   Not on file  Tobacco Use   Smoking status: Former    Packs/day: 0.50    Years: 55.00    Total pack years: 27.50    Types: E-cigarettes, Cigarettes   Smokeless tobacco: Never   Tobacco comments:    Patient used to smoke up to 2 PPD.  Substance and Sexual Activity   Alcohol use: Yes    Alcohol/week: 1.0 standard drink of alcohol    Types: 1 Cans of beer per week    Comment: occasional beer   Drug use: Never   Sexual activity: Not on file  Other Topics Concern   Not on file  Social History Narrative   Daughter - lives in [place], [place] - lives out of state - both are very successful.   Social Determinants of Health   Financial Resource Strain: Low Risk  (01/12/2022)   Overall Financial Resource Strain (CARDIA)    Difficulty of Paying Living Expenses: Not hard at all  Food Insecurity: No Food Insecurity (12/26/2020)   Hunger Vital Sign    Worried About Running Out of Food in the Last Year: Never true    Ran Out of Food in the Last Year: Never true  Transportation Needs: No Transportation Needs (01/12/2022)   PRAPARE - Administrator, Civil ServiceTransportation    Lack of Transportation (Medical): No    Lack of Transportation (Non-Medical): No  Physical Activity: Insufficiently Active (01/12/2022)   Exercise Vital Sign    Days of  Exercise per Week: 3 days    Minutes of Exercise per Session: 20 min  Stress: Stress Concern Present (01/12/2022)   [employer] - Occupational Stress Questionnaire    Feeling of Stress : To some extent  Social Connections: Unknown (01/12/2022)   Social Connection and Isolation Panel [NHANES]    Frequency of Communication with Friends and Family: More than three times a week    Frequency of Social Gatherings with Friends and Family: Patient refused    Attends Religious Services: Patient refused    Database administratorActive Member of Clubs or Organizations: No    Attends BankerClub or Organization Meetings: Never    Marital Status: Married  Tobacco Counseling Counseling given: Not Answered Tobacco comments: Patient used to smoke up to 2 PPD.   Clinical Intake:  Pre-visit preparation completed: Yes     BMI - recorded: 18.14 Nutritional Status: BMI <19  Underweight Nutritional Risks: Unintentional weight loss Diabetes: No  How often do you need to have someone help you when you read instructions, pamphlets, or other written materials from your doctor or pharmacy?: 1 - Never Interpreter Needed?: No   Activities of Daily Living    01/12/2022    8:33 AM  In your present state of health, do you have any difficulty performing the following activities:  Hearing? 0  Vision? 0  Difficulty concentrating or making decisions? 0  Walking or climbing stairs? 0  Dressing or bathing? 0  Doing errands, shopping? 0  Preparing Food and eating ? N  Using the Toilet? N  In the past six months, have you accidently leaked urine? N  Do you have problems with loss of bowel control? N  Managing your Medications? N  Managing your Finances? N  Housekeeping or managing your Housekeeping? N    Patient Care Team: Rochel Brome, MD as PCP - General (Internal Medicine) Misenheimer, Christia Reading, MD as Consulting Physician (Gastroenterology) Gardiner Rhyme, MD as Referring Physician (Pulmonary  Disease) Burnice Logan, [facility] (Inactive) as Pharmacist (Pharmacist) Neldon Mc Donnamarie Poag, MD as Consulting Physician (Allergy and Immunology) Versie Starks (Orthopedic Surgery)     Assessment:   This is a routine wellness examination for Sold[place].  Hearing/Vision screen No results found.  Dietary issues and exercise activities discussed: Current Exercise Habits: The patient does not participate in regular exercise at present (very active around home and outside), Intensity: Moderate, Exercise limited by: None identified   Goals Addressed             This Visit's Progress    Gain weight       Continue high calorie supplement shakes       Depression Screen    12/10/2021   10:44 AM 12/10/2021   10:02 AM 08/26/2021    7:36 AM 03/17/2021    5:22 PM 12/26/2020   10:03 AM 11/19/2020    7:57 AM 08/13/2020    8:24 AM  PHQ 2/9 Scores  PHQ - 2 Score 2 0 0 0 0 0 2  PHQ- 9 Score 7   0  2 2    Fall Risk    01/12/2022    8:33 AM 12/10/2021   10:01 AM 08/26/2021    7:36 AM 02/25/2021    7:49 AM 12/26/2020   10:10 AM  Fall Risk   Falls in the past year? 1 1 1  0 0  Number falls in past yr: 1 0 1 0 0  Injury with Fall? 1 0 0 1 0  Risk for fall due to : History of fall(s) No Fall Risks No Fall Risks  No Fall Risks  Follow up Falls evaluation completed;Education provided Falls evaluation completed Falls evaluation completed Falls evaluation completed Falls evaluation completed    [place]:  Home free of loose throw rugs in walkways, pet beds, electrical cords, etc? Yes  Adequate lighting in your home to reduce risk of falls? Yes   ASSISTIVE DEVICES UTILIZED TO PREVENT FALLS:  Life alert? No  Use of a cane, walker or w/c? No  Grab bars in the bathroom? No  Shower chair or bench in shower? No  Elevated toilet seat or a handicapped  toilet? No   Gait steady and fast without use of assistive device  Cognitive Function:        01/12/2022    12:27 PM 12/26/2020   10:30 AM 08/31/2019    1:12 PM  6CIT Screen  What Year? 0 points 0 points 0 points  What month? 0 points 0 points 0 points  What time? 0 points 0 points 0 points  Count back from 20 0 points 0 points 0 points  Months in reverse 0 points 0 points 2 points  Repeat phrase 0 points 0 points 2 points  Total Score 0 points 0 points 4 points    Immunizations Immunization History  Administered Date(s) Administered   COVID-19, mRNA, vaccine(Comirnaty)12 years and older 01/12/2022   Fluad Quad(high Dose 65+) 11/24/2019, 11/19/2020, 12/10/2021   Influenza-Unspecified 12/05/2018   Moderna Covid-19 Vaccine Bivalent Booster 70yrs & up 12/26/2020   Moderna Sars-Covid-2 Vaccination 05/17/2019, 06/14/2019, 02/13/2020   Pneumococcal Conjugate-13 11/19/2020   Pneumococcal Polysaccharide-23 12/22/2015   Respiratory Syncytial Virus Vaccine,Recomb Aduvanted(Arexvy) 12/15/2021   Tdap 06/30/2017   Zoster Recombinat (Shingrix) 12/15/2021    TDAP status: Up to date  Flu Vaccine status: Up to date  Pneumococcal vaccine status: Up to date  Covid-19 vaccine status: Completed vaccines  Qualifies for Shingles Vaccine? Yes   Zostavax completed No   Shingrix Completed?: No.    Education has been provided regarding the importance of this vaccine. Patient has been advised to call insurance company to determine out of pocket expense if they have not yet received this vaccine. Advised may also receive vaccine at local pharmacy or Health Dept. Verbalized acceptance and understanding.  Screening Tests Health Maintenance  Topic Date Due   Lung Cancer Screening  05/11/2020   COVID-19 Vaccine (5 - Moderna risk series) 02/20/2021   Medicare Annual Wellness (AWV)  12/26/2021   Zoster Vaccines- Shingrix (2 of 2) 02/09/2022   MAMMOGRAM  01/08/2023   DEXA SCAN  03/22/2023   Fecal DNA (Cologuard)  01/30/2024   TETANUS/TDAP  07/01/2027   Pneumonia Vaccine 71+ Years old  Completed   INFLUENZA  VACCINE  Completed   Hepatitis C Screening  Completed   HPV VACCINES  Aged Out    Health Maintenance  Health Maintenance Due  Topic Date Due   Lung Cancer Screening  05/11/2020   COVID-19 Vaccine (5 - Moderna risk series) 02/20/2021   Medicare Annual Wellness (AWV)  12/26/2021    Colorectal cancer screening: Type of screening: Cologuard. Completed 01/29/2021. Repeat every 3 years  Mammogram status: Completed 01/07/22. Repeat every year  Bone Density status: Completed 03/21/2021. Results reflect: Bone density results: OSTEOPOROSIS. Repeat every 1 years.  Lung Cancer Screening: (Low Dose CT Chest recommended if Age 67-80 years, 30 pack-year currently smoking OR have quit w/in 15years.) does qualify.   Lung Cancer Screening Referral: patient states that she has an appointment to go today for screening  Additional Screening:  Vision Screening: Recommended annual ophthalmology exams for early detection of glaucoma and other disorders of the eye. Is the patient up to date with their annual eye exam?  Yes   Dental Screening: Recommended annual dental exams for proper oral hygiene  Community Resource Referral / Chronic Care Management: CRR required this visit?  No   CCM required this visit?  No      Plan:    1- Coupons given for supplemental shakes - discussed getting high calorie shake 2- Evenity Injections given today - patient to be scheduled for DEXA after  one year Evenity injections are complete (in March 2024) 3- COVID Booster given 4- Follow-up with pharmacy to get 2nd Shingrix vaccine  I have personally reviewed and noted the following in the patient's chart:   Medical and social history Use of alcohol, tobacco or illicit drugs  Current medications and supplements including opioid prescriptions.  Functional ability and status Nutritional status Physical activity Advanced directives List of other physicians Hospitalizations, surgeries, and ER visits in previous 12  months Vitals Screenings to include cognitive, depression, and falls Referrals and appointments  In addition, I have reviewed and discussed with patient certain preventive protocols, quality metrics, and best practice recommendations. A written personalized care plan for preventive services as well as general preventive health recommendations were provided to patient.     Jacklynn Bue, LPN   [id number]

## 2022-01-12 NOTE — Patient Instructions (Signed)
Ellen Holloway , Thank you for taking time to come for your Medicare Wellness Visit. I appreciate your ongoing commitment to your health goals. Please review the following plan we discussed and let me know if I can assist you in the future.   Screening recommendations/referrals: Colorectal Cancer Screening: Cologuard due 01/2024 Mammogram: 12/2022 Bone Density: Ordered - to be completed next March after completing Evenity Recommended yearly ophthalmology/optometry visit for glaucoma screening and checkup Recommended yearly dental visit for hygiene and checkup  Vaccinations: Influenza vaccine: Up-to-date Pneumococcal vaccine: completed Tdap vaccine: Due 06/2027 Shingles vaccine: 2nd dose due soon    Advanced directives: Please bring a copy for your medical record  Conditions/risks identified: unintentional weight loss    Preventive Care 72 Years and Older, Female   Preventive care refers to lifestyle choices and visits with your health care provider that can promote health and wellness.  What does preventive care include? A yearly physical exam. This is also called an annual well check. Dental exams once or twice a year. Routine eye exams. Ask your health care provider how often you should have your eyes checked. Personal lifestyle choices, including: Daily care of your teeth and gums. Regular physical activity. Eating a healthy diet. Avoiding tobacco and drug use. Limiting alcohol use. Practicing safe sex. Taking low-dose aspirin every day. Taking vitamin and mineral supplements as recommended by your health care provider.  What happens during an annual well check? The services and screenings done by your health care provider during your annual well check will depend on your age, overall health, lifestyle risk factors, and family history of disease.  Counseling Your health care provider may ask you questions about your: Alcohol use. Tobacco use. Drug use. Emotional  well-being. Home and relationship well-being. Sexual activity. Eating habits. History of falls. Memory and ability to understand (cognition). Work and work Astronomer. Reproductive health.  Screening You may have the following tests or measurements: Height, weight, and BMI. Blood pressure. Lipid and cholesterol levels. These may be checked every 5 years, or more frequently if you are over 2 years old. Skin check. Lung cancer screening. You may have this screening every year starting at age 72 if you have a 30-pack-year history of smoking and currently smoke or have quit within the past 15 years. Fecal occult blood test (FOBT) of the stool. You may have this test every year starting at age 72. Flexible sigmoidoscopy or colonoscopy. You may have a sigmoidoscopy every 5 years or a colonoscopy every 10 years starting at age 72. Hepatitis C blood test. Hepatitis B blood test. Sexually transmitted disease (STD) testing. Diabetes screening. This is done by checking your blood sugar (glucose) after you have not eaten for a while (fasting). You may have this done every 1-3 years. Bone density scan. This is done to screen for osteoporosis. You may have this done starting at age 72. Mammogram. This may be done every 1-2 years. Talk to your health care provider about how often you should have regular mammograms. Talk with your health care provider about your test results, treatment options, and if necessary, the need for more tests.  Vaccines Your health care provider may recommend certain vaccines, such as: Influenza vaccine. This is recommended every year. Tetanus, diphtheria, and acellular pertussis (Tdap, Td) vaccine. You may need a Td booster every 10 years. Zoster vaccine. You may need this after age 37. Pneumococcal 13-valent conjugate (PCV13) vaccine. One dose is recommended after age 72. Pneumococcal polysaccharide (PPSV23) vaccine. One dose is  recommended after age 72. Talk to your  health care provider about which screenings and vaccines you need and how often you need them.  This information is not intended to replace advice given to you by your health care provider. Make sure you discuss any questions you have with your health care provider. Document Released: 03/29/2015 Document Revised: 11/20/2015 Document Reviewed: 01/01/2015 Elsevier Interactive Patient Education  2017 [place] Prevention in the Home  Falls can cause injuries. They can happen to people of all ages. There are many things you can do to make your home safe and to help prevent falls.  What can I do on the outside of my home? Regularly fix the edges of walkways and driveways and fix any cracks. Remove anything that might make you trip as you walk through a door, such as a raised step or threshold. Trim any bushes or trees on the path to your home. Use bright outdoor lighting. Clear any walking paths of anything that might make someone trip, such as rocks or tools. Regularly check to see if handrails are loose or broken. Make sure that both sides of any steps have handrails. Any raised decks and porches should have guardrails on the edges. Have any leaves, snow, or ice cleared regularly. Use sand or salt on walking paths during winter. Clean up any spills in your garage right away. This includes oil or grease spills.  What can I do in the bathroom? Use night lights. Install grab bars by the toilet and in the tub and shower. Do not use towel bars as grab bars. Use non-skid mats or decals in the tub or shower. If you need to sit down in the shower, use a plastic, non-slip stool. Keep the floor dry. Clean up any water that spills on the floor as soon as it happens. Remove soap buildup in the tub or shower regularly. Attach bath mats securely with double-sided non-slip rug tape. Do not have throw rugs and other things on the floor that can make you trip.  What can I do in the  bedroom? Use night lights. Make sure that you have a light by your bed that is easy to reach. Do not use any sheets or blankets that are too big for your bed. They should not hang down onto the floor. Have a firm chair that has side arms. You can use this for support while you get dressed. Do not have throw rugs and other things on the floor that can make you trip.  What can I do in the kitchen? Clean up any spills right away. Avoid walking on wet floors. Keep items that you use a lot in easy-to-reach places. If you need to reach something above you, use a strong step stool that has a grab bar. Keep electrical cords out of the way. Do not use floor polish or wax that makes floors slippery. If you must use wax, use non-skid floor wax. Do not have throw rugs and other things on the floor that can make you trip.  What can I do with my stairs? Do not leave any items on the stairs. Make sure that there are handrails on both sides of the stairs and use them. Fix handrails that are broken or loose. Make sure that handrails are as long as the stairways. Check any carpeting to make sure that it is firmly attached to the stairs. Fix any carpet that is loose or worn. Avoid having throw rugs at  the top or bottom of the stairs. If you do have throw rugs, attach them to the floor with carpet tape. Make sure that you have a light switch at the top of the stairs and the bottom of the stairs. If you do not have them, ask someone to add them for you.  What else can I do to help prevent falls? Wear shoes that: Do not have high heels. Have rubber bottoms. Are comfortable and fit you well. Are closed at the toe. Do not wear sandals. If you use a stepladder: Make sure that it is fully opened. Do not climb a closed stepladder. Make sure that both sides of the stepladder are locked into place. Ask someone to hold it for you, if possible. Clearly mark and make sure that you can see: Any grab bars or  handrails. First and last steps. Where the edge of each step is. Use tools that help you move around (mobility aids) if they are needed. These include: Canes. Walkers. Scooters. Crutches. Turn on the lights when you go into a dark area. Replace any light bulbs as soon as they burn out. Set up your furniture so you have a clear path. Avoid moving your furniture around. If any of your floors are uneven, fix them. If there are any pets around you, be aware of where they are. Review your medicines with your doctor. Some medicines can make you feel dizzy. This can increase your chance of falling. Ask your doctor what other things that you can do to help prevent falls.  This information is not intended to replace advice given to you by your health care provider. Make sure you discuss any questions you have with your health care provider. Document Released: 12/27/2008 Document Revised: 08/08/2015 Document Reviewed: 04/06/2014 Elsevier Interactive Patient Education  2017 [employer].

## 2022-01-13 ENCOUNTER — Ambulatory Visit: Payer: Medicare Other | Admitting: Family Medicine

## 2022-01-19 ENCOUNTER — Encounter: Payer: Self-pay | Admitting: Family Medicine

## 2022-01-20 ENCOUNTER — Other Ambulatory Visit: Payer: Medicare Other

## 2022-02-04 ENCOUNTER — Ambulatory Visit
Admission: RE | Admit: 2022-02-04 | Discharge: 2022-02-04 | Disposition: A | Discharge status: Home / Self Care | Payer: Medicare Other | Source: Ambulatory Visit | Attending: Family Medicine | Admitting: Family Medicine

## 2022-02-04 ENCOUNTER — Other Ambulatory Visit: Payer: Self-pay | Admitting: Family Medicine

## 2022-02-04 DIAGNOSIS — R928 Other abnormal and inconclusive findings on diagnostic imaging of breast: Secondary | ICD-10-CM

## 2022-02-04 DIAGNOSIS — N6320 Unspecified lump in the left breast, unspecified quadrant: Secondary | ICD-10-CM

## 2022-02-06 ENCOUNTER — Other Ambulatory Visit: Payer: Self-pay | Admitting: Family Medicine

## 2022-02-12 ENCOUNTER — Ambulatory Visit
Admission: RE | Admit: 2022-02-12 | Discharge: 2022-02-12 | Disposition: A | Discharge status: Home / Self Care | Payer: Medicare Other | Source: Ambulatory Visit | Attending: Family Medicine | Admitting: Family Medicine

## 2022-02-12 DIAGNOSIS — N6320 Unspecified lump in the left breast, unspecified quadrant: Secondary | ICD-10-CM

## 2022-02-12 HISTORY — PX: BREAST BIOPSY: SHX20

## 2022-02-13 ENCOUNTER — Ambulatory Visit (INDEPENDENT_AMBULATORY_CARE_PROVIDER_SITE_OTHER): Payer: Medicare Other

## 2022-02-13 DIAGNOSIS — M810 Age-related osteoporosis without current pathological fracture: Secondary | ICD-10-CM | POA: Diagnosis not present

## 2022-02-13 MED ORDER — ROMOSOZUMAB-AQQG 105 MG/1.17ML ~~LOC~~ SOSY
210.0000 mg | PREFILLED_SYRINGE | Freq: Once | SUBCUTANEOUS | Status: AC
  Administered 2022-02-13: 210 mg via SUBCUTANEOUS

## 2022-02-13 NOTE — Progress Notes (Signed)
Patient came in for Evenity injection.

## 2022-02-15 ENCOUNTER — Other Ambulatory Visit: Payer: Self-pay | Admitting: Family Medicine

## 2022-02-15 DIAGNOSIS — E782 Mixed hyperlipidemia: Secondary | ICD-10-CM

## 2022-03-11 ENCOUNTER — Other Ambulatory Visit: Payer: Self-pay | Admitting: Family Medicine

## 2022-03-11 DIAGNOSIS — F321 Major depressive disorder, single episode, moderate: Secondary | ICD-10-CM

## 2022-03-18 ENCOUNTER — Ambulatory Visit: Payer: Medicare Other

## 2022-03-25 ENCOUNTER — Ambulatory Visit (INDEPENDENT_AMBULATORY_CARE_PROVIDER_SITE_OTHER): Payer: Medicare Other

## 2022-03-25 ENCOUNTER — Other Ambulatory Visit: Payer: Self-pay | Admitting: Family Medicine

## 2022-03-25 DIAGNOSIS — M810 Age-related osteoporosis without current pathological fracture: Secondary | ICD-10-CM | POA: Diagnosis not present

## 2022-03-25 MED ORDER — ROMOSOZUMAB-AQQG 105 MG/1.17ML ~~LOC~~ SOSY
210.0000 mg | PREFILLED_SYRINGE | Freq: Once | SUBCUTANEOUS | Status: AC
  Administered 2022-03-25: 210 mg via SUBCUTANEOUS

## 2022-03-25 NOTE — Progress Notes (Signed)
   Evenity injection given per order, patient tolerated well.   Erie Noe, LPN 3:23 AM

## 2022-04-08 ENCOUNTER — Encounter: Payer: Self-pay | Admitting: Family Medicine

## 2022-04-15 ENCOUNTER — Ambulatory Visit (INDEPENDENT_AMBULATORY_CARE_PROVIDER_SITE_OTHER): Payer: Medicare Other | Admitting: Family Medicine

## 2022-04-15 ENCOUNTER — Encounter: Payer: Self-pay | Admitting: Family Medicine

## 2022-04-15 VITALS — BP 108/72 | HR 52 | Temp 97.7°F | Ht 63.0 in | Wt 106.0 lb

## 2022-04-15 DIAGNOSIS — G894 Chronic pain syndrome: Secondary | ICD-10-CM | POA: Diagnosis not present

## 2022-04-15 DIAGNOSIS — G2581 Restless legs syndrome: Secondary | ICD-10-CM

## 2022-04-15 DIAGNOSIS — E782 Mixed hyperlipidemia: Secondary | ICD-10-CM | POA: Diagnosis not present

## 2022-04-15 DIAGNOSIS — M810 Age-related osteoporosis without current pathological fracture: Secondary | ICD-10-CM

## 2022-04-15 DIAGNOSIS — F330 Major depressive disorder, recurrent, mild: Secondary | ICD-10-CM | POA: Diagnosis not present

## 2022-04-15 DIAGNOSIS — E441 Mild protein-calorie malnutrition: Secondary | ICD-10-CM

## 2022-04-15 DIAGNOSIS — J410 Simple chronic bronchitis: Secondary | ICD-10-CM

## 2022-04-15 DIAGNOSIS — J9611 Chronic respiratory failure with hypoxia: Secondary | ICD-10-CM

## 2022-04-15 MED ORDER — CLONAZEPAM 1 MG PO TABS: 1.0000 mg | ORAL_TABLET | Freq: Every day | ORAL | 1 refills | Status: DC | PRN

## 2022-04-15 MED ORDER — PROMETHAZINE HCL 25 MG PO TABS: 25.0000 mg | ORAL_TABLET | Freq: Four times a day (QID) | ORAL | 1 refills | Status: DC | PRN

## 2022-04-15 NOTE — Progress Notes (Signed)
Subjective:  Patient ID: Ellen Holloway, female    DOB: 1950-06-10  Age: 72 y.o. MRN: [id number]  Chief Complaint  Patient presents with   Hyperlipidemia   Depression   HPI: Patient is a 72 year old white female with past medical history significant for hyperlipidemia, COPD, chronic pain syndrome, and GERD.  Hyperlipidemia: Currently on Lipitor 10 mg once daily.  Last labs done 6 months ago are within normal limits.  GERD: Currently on Pepcid 20 mg once daily and Protonix 40 mg once daily..  Well-controlled.  COPD: Currently on albuterol, nebulizers 1 every 6 hours as needed for shortness of breath. Oxygen 2 L PNC at night. Patient went to pick up Trelegy inhaler and it was going to be over 400$ so patient did not get. Changed to advair and taking as needed.   Chronic pain syndrome: [facility] is on Percocet 10/325 4 times a day as needed.  Patient has chronic back pain related to a compression fraction.   Restless Leg syndrome currently on Mirapex 1 mg once at night which works well.  Depression with anxiety: Well-controlled on Zoloft 100 mg 2 daily.  Has clonazepam 1 mg twice daily as needed, but takes rarely. .  She has not actually filled it since May.     Chronic respiratory failure. Uses 2 L oxygen at night. Has COPD. Sees Dr. Blenda Nicely.      04/15/2022   10:05 AM 12/10/2021   10:44 AM 12/10/2021   10:02 AM 08/26/2021    7:36 AM 03/17/2021    5:22 PM  Depression screen PHQ 2/9  Decreased Interest 0 1 0 0 0  Down, Depressed, Hopeless 0 1 0 0 0  PHQ - 2 Score 0 2 0 0 0  Altered sleeping 1 1   0  Tired, decreased energy 0 1   0  Change in appetite 0 3   0  Feeling bad or failure about yourself  0 0   0  Trouble concentrating 0 0   0  Moving slowly or fidgety/restless 0 0   0  Suicidal thoughts 0 0   0  PHQ-9 Score 1 7   0  Difficult doing work/chores Not difficult at all Not difficult at all   Not difficult at all         02/25/2021    7:49  AM 08/26/2021    7:36 AM 12/10/2021   10:01 AM 01/12/2022    8:33 AM 04/15/2022   10:05 AM  Fall Risk  Falls in the past year? 0 1 1 1 1   Was there an injury with Fall? 1 0 0 1 0  Fall Risk Category Calculator 1 2 1 3 2   Fall Risk Category (Retired) Low Moderate Low High   (RETIRED) Patient Fall Risk Level  Low fall risk Low fall risk Moderate fall risk   Patient at Risk for Falls Due to  No Fall Risks No Fall Risks History of fall(s) History of fall(s)  Fall risk Follow up Falls evaluation completed Falls evaluation completed Falls evaluation completed Falls evaluation completed;Education provided Falls evaluation completed;Education provided      Review of Systems  Constitutional:  Negative for appetite change, fatigue and fever.  HENT:  Negative for congestion, ear pain, sinus pressure and sore throat.   Respiratory:  Negative for cough, chest tightness, shortness of breath and wheezing.   Cardiovascular:  Negative for chest pain and palpitations.  Gastrointestinal:  Negative for abdominal pain, constipation,  diarrhea, nausea and vomiting.  Genitourinary:  Negative for dysuria and hematuria.  Musculoskeletal:  Negative for arthralgias, back pain, joint swelling and myalgias.  Skin:  Negative for rash.  Neurological:  Negative for dizziness, weakness and headaches.  Psychiatric/Behavioral:  Negative for dysphoric mood. The patient is not nervous/anxious.     Current Outpatient Medications on File Prior to Visit  Medication Sig Dispense Refill   ADVAIR DISKUS 250-50 MCG/ACT AEPB Inhale 1 puff into the lungs 2 (two) times daily.     albuterol (VENTOLIN HFA) 108 (90 Base) MCG/ACT inhaler Inhale 2 puffs into the lungs every 6 (six) hours as needed for wheezing or shortness of breath. 8 g 0   atorvastatin (LIPITOR) 10 MG tablet TAKE 1 TABLET BY MOUTH EVERY DAY 90 tablet 1   BIOTIN PO Take by mouth.     calcium carbonate (OSCAL) 1500 (600 Ca) MG TABS tablet Take 600 mg of elemental  calcium by mouth daily.     COLLAGEN PO Take by mouth.     cyclobenzaprine (FLEXERIL) 10 MG tablet TAKE 1 TABLET BY MOUTH EVERYDAY AT BEDTIME 90 tablet 1   famotidine (PEPCID) 40 MG tablet TAKE 1 TABLET BY MOUTH EVERYDAY AT BEDTIME (Patient taking differently: Take 80 mg by mouth at bedtime.) 90 tablet 2   feeding supplement (BOOST HIGH PROTEIN) LIQD Take 1 Container by mouth in the morning and at bedtime.     furosemide (LASIX) 20 MG tablet TAKE 1 TABLET (20 MG TOTAL) BY MOUTH DAILY AS NEEDED FOR SWELLING 90 tablet 1   oxyCODONE-acetaminophen (PERCOCET) 10-325 MG tablet Take 1 tablet by mouth every 6 (six) hours.     OXYGEN Inhale 3 L into the lungs. 2 L at night.     pramipexole (MIRAPEX) 1 MG tablet TAKE 1 TABLET BY MOUTH EVERYDAY AT BEDTIME 90 tablet 1   sertraline (ZOLOFT) 100 MG tablet TAKE 2 TABLETS BY MOUTH EVERY DAY 180 tablet 0   spironolactone (ALDACTONE) 25 MG tablet TAKE 1 TABLET BY MOUTH EVERY DAY 90 tablet 0   VITAMIN D-VITAMIN K PO Take 1 tablet by mouth daily.     No current facility-administered medications on file prior to visit.   Past Medical History:  Diagnosis Date   Acquired absence of both cervix and uterus    Age-related osteoporosis with current pathological fracture, unspecified site, initial encounter for fracture    Asthma    Barrett's esophagus with low grade dysplasia    Chronic obstructive pulmonary disease (COPD) (HCC)    Major depressive disorder, recurrent severe without psychotic features ([place])    Mixed hyperlipidemia    Obstructive sleep apnea (adult) (pediatric)    Other obesity due to excess calories    Other pulmonary embolism without acute cor pulmonale (HCC)    Other specified anxiety disorders    Sleep related leg cramps    Telogen effluvium    Urticaria    Past Surgical History:  Procedure Laterality Date   ABDOMINAL HYSTERECTOMY  1980   partial   BREAST BIOPSY Left 02/12/2022   [place] LT BREAST BX W LOC DEV 1ST LESION IMG BX SPEC US GUIDE  02/12/2022 [facility]   right wrist 1985      Family History  Problem Relation Age of Onset   Obesity Mother    Heart attack Father    Allergic rhinitis Sister    Thyroid cancer Daughter    Asthma Brother    Renal Disease Brother    Breast cancer Neg  Hx    Social History   Socioeconomic History   Marital status: Married    Spouse name: Lanny Hurst   Number of children: 2   Years of education: Not on file   Highest education level: Not on file  Occupational History   Not on file  Tobacco Use   Smoking status: Former    Packs/day: 0.50    Years: 55.00    Total pack years: 27.50    Types: E-cigarettes, Cigarettes    Quit date: 2019    Years since quitting: 5.0   Smokeless tobacco: Never   Tobacco comments:    Patient used to smoke up to 2 PPD.  Substance and Sexual Activity   Alcohol use: Yes    Alcohol/week: 1.0 standard drink of alcohol    Types: 1 Cans of beer per week    Comment: occasional beer   Drug use: Never   Sexual activity: Not on file  Other Topics Concern   Not on file  Social History Narrative   Daughter - lives in [place], [place] - lives out of state - both are very successful.   Social Determinants of Health   Financial Resource Strain: Low Risk  (01/12/2022)   Overall Financial Resource Strain (CARDIA)    Difficulty of Paying Living Expenses: Not hard at all  Food Insecurity: No Food Insecurity (12/26/2020)   Hunger Vital Sign    Worried About Running Out of Food in the Last Year: Never true    Ran Out of Food in the Last Year: Never true  Transportation Needs: No Transportation Needs (01/12/2022)   PRAPARE - Hydrologist (Medical): No    Lack of Transportation (Non-Medical): No  Physical Activity: Insufficiently Active (01/12/2022)   Exercise Vital Sign    Days of Exercise per Week: 3 days    Minutes of Exercise per Session: 20 min  Stress: Stress Concern Present (01/12/2022)   [place]    Feeling of Stress : To some extent  Social Connections: Unknown (01/12/2022)   Social Connection and Isolation Panel [NHANES]    Frequency of Communication with Friends and Family: More than three times a week    Frequency of Social Gatherings with Friends and Family: Patient refused    Attends Religious Services: Patient refused    Printmaker: No    Attends Music therapist: Never    Marital Status: Married    Objective:  BP 108/72 (BP Location: Left Arm, Patient Position: Sitting)   Pulse (!) 52   Temp 97.7 F (36.5 C) (Temporal)   Ht 5\' 3"  (1.6 m)   Wt 106 lb (48.1 kg)   LMP  (LMP Unknown)   SpO2 98%   BMI 18.78 kg/m      04/15/2022   10:09 AM 01/12/2022   12:17 PM 12/10/2021    9:56 AM  BP/Weight  Systolic BP 254 270 623  Diastolic BP 72 64 60  Wt. (Lbs) 106 102.4 102.2  BMI 18.78 kg/m2 18.14 kg/m2 18.1 kg/m2    Physical Exam Vitals reviewed.  Constitutional:      Appearance: Normal appearance.     Comments: Thin   Neck:     Vascular: No carotid bruit.  Cardiovascular:     Rate and Rhythm: Normal rate and regular rhythm.     Heart sounds: Normal heart sounds.  Pulmonary:     Effort: Pulmonary effort  is normal.     Breath sounds: Normal breath sounds.  Abdominal:     General: Abdomen is flat. Bowel sounds are normal.     Palpations: Abdomen is soft.     Tenderness: There is no abdominal tenderness.  Neurological:     Mental Status: She is alert and oriented to person, place, and time.  Psychiatric:        Mood and Affect: Mood normal.        Behavior: Behavior normal.     Diabetic Foot Exam - Simple   No data filed      Lab Results  Component Value Date   WBC 6.4 04/15/2022   HGB 13.7 04/15/2022   HCT 40.4 04/15/2022   PLT 221 04/15/2022   GLUCOSE 87 04/15/2022   CHOL 177 04/15/2022   TRIG 57 04/15/2022   HDL 107 04/15/2022   LDLCALC 59  04/15/2022   ALT 25 04/15/2022   AST 30 04/15/2022   NA 141 04/15/2022   K 4.9 04/15/2022   CL 98 04/15/2022   CREATININE 0.78 04/15/2022   BUN 14 04/15/2022   CO2 28 04/15/2022   TSH 1.860 08/26/2021      Assessment & Plan:    Mixed hyperlipidemia Assessment & Plan: Well controlled.  No changes to medicines. Continue lipitor 10 mg daily.  Continue to work on eating a healthy diet and exercise.  Labs drawn today.    Orders: -     CBC with Differential/Platelet -     Comprehensive metabolic panel -     Lipid panel  Depression, major, recurrent, mild (HCC) Assessment & Plan: The current medical regimen is effective;  continue present plan and medications. Continue zoloft 100 mg 2 daily and clonazepam 1 mg twice daily as needed. None needed for several months.   Orders: -     clonazePAM; Take 1 tablet (1 mg total) by mouth daily as needed for anxiety.  Dispense: 30 tablet; Refill: 1  Osteoporosis, postmenopausal Assessment & Plan: Continue Evenity 2 shots monthly.  Orders: -     VITAMIN D 25 Hydroxy (Vit-D Deficiency, Fractures)  Age-related osteoporosis without current pathological fracture Assessment & Plan: Continue Evenity 2 shots monthly.   Chronic pain syndrome Assessment & Plan: Management per specialist.  Continue percocet.   Simple chronic bronchitis (HCC) Assessment & Plan: The current medical regimen is effective;  continue present plan and medications. Continue advair.    Mild protein-calorie malnutrition (HCC) Assessment & Plan: Recommend continue three meals per day.  Protein shakes recommended.    Chronic respiratory failure with hypoxia (HCC) Assessment & Plan: Continue oxygen 2 L at night.    RLS (restless legs syndrome) Assessment & Plan: Continue mirapex 1 mg one before bed.    Other orders -     Promethazine HCl; Take 1 tablet (25 mg total) by mouth every 6 (six) hours as needed for nausea or vomiting.  Dispense: 30  tablet; Refill: 1 -     Cardiovascular Risk Assessment   Look into dexa and when due.  Meds ordered this encounter  Medications   clonazePAM (KLONOPIN) 1 MG tablet    Sig: Take 1 tablet (1 mg total) by mouth daily as needed for anxiety.    Dispense:  30 tablet    Refill:  1    This request is for a new prescription for a controlled substance as required by Federal/State law.   promethazine (PHENERGAN) 25 MG tablet    Sig: Take 1  tablet (25 mg total) by mouth every 6 (six) hours as needed for nausea or vomiting.    Dispense:  30 tablet    Refill:  1    Orders Placed This Encounter  Procedures   CBC with Differential/Platelet   Comprehensive metabolic panel   Lipid panel   VITAMIN D 25 Hydroxy (Vit-D Deficiency, Fractures)   Cardiovascular Risk Assessment     Follow-up: Return in about 1 year (around 04/16/2023) for chronic fasting.  An After Visit Summary was printed and given to the patient.   I,Lauren M Auman,acting as a scribe for Rochel Brome, MD.,have documented all relevant documentation on the behalf of Rochel Brome, MD,as directed by  Rochel Brome, MD while in the presence of Rochel Brome, MD.    Rochel Brome, MD [place] [phone]

## 2022-04-16 LAB — COMPREHENSIVE METABOLIC PANEL
ALT: 25 IU/L (ref 0–32)
AST: 30 IU/L (ref 0–40)
Albumin/Globulin Ratio: 2 (ref 1.2–2.2)
Albumin: 4.5 g/dL (ref 3.8–4.8)
Alkaline Phosphatase: 125 IU/L — ABNORMAL HIGH (ref 44–121)
BUN/Creatinine Ratio: 18 (ref 12–28)
BUN: 14 mg/dL (ref 8–27)
Bilirubin Total: 0.3 mg/dL (ref 0.0–1.2)
CO2: 28 mmol/L (ref 20–29)
Calcium: 9.7 mg/dL (ref 8.7–10.3)
Chloride: 98 mmol/L (ref 96–106)
Creatinine, Ser: 0.78 mg/dL (ref 0.57–1.00)
Globulin, Total: 2.2 g/dL (ref 1.5–4.5)
Glucose: 87 mg/dL (ref 70–99)
Potassium: 4.9 mmol/L (ref 3.5–5.2)
Sodium: 141 mmol/L (ref 134–144)
Total Protein: 6.7 g/dL (ref 6.0–8.5)
eGFR: 81 mL/min/{1.73_m2} (ref >59)

## 2022-04-16 LAB — LIPID PANEL
Chol/HDL Ratio: 1.7 ratio (ref 0.0–4.4)
Cholesterol, Total: 177 mg/dL (ref 100–199)
HDL: 107 mg/dL (ref >39)
LDL Chol Calc (NIH): 59 mg/dL (ref 0–99)
Triglycerides: 57 mg/dL (ref 0–149)
VLDL Cholesterol Cal: 11 mg/dL (ref 5–40)

## 2022-04-16 LAB — CBC WITH DIFFERENTIAL/PLATELET
Basophils Absolute: 0 10*3/uL (ref 0.0–0.2)
Basos: 1 %
EOS (ABSOLUTE): 0.2 10*3/uL (ref 0.0–0.4)
Eos: 3 %
Hematocrit: 40.4 % (ref 34.0–46.6)
Hemoglobin: 13.7 g/dL (ref 11.1–15.9)
Immature Grans (Abs): 0 10*3/uL (ref 0.0–0.1)
Immature Granulocytes: 0 %
Lymphocytes Absolute: 2.4 10*3/uL (ref 0.7–3.1)
Lymphs: 38 %
MCH: 31.1 pg (ref 26.6–33.0)
MCHC: 33.9 g/dL (ref 31.5–35.7)
MCV: 92 fL (ref 79–97)
Monocytes Absolute: 0.6 10*3/uL (ref 0.1–0.9)
Monocytes: 10 %
Neutrophils Absolute: 3.2 10*3/uL (ref 1.4–7.0)
Neutrophils: 48 %
Platelets: 221 10*3/uL (ref 150–450)
RBC: 4.41 x10E6/uL (ref 3.77–5.28)
RDW: 11.9 % (ref 11.7–15.4)
WBC: 6.4 10*3/uL (ref 3.4–10.8)

## 2022-04-16 LAB — VITAMIN D 25 HYDROXY (VIT D DEFICIENCY, FRACTURES): Vit D, 25-Hydroxy: 31.3 ng/mL (ref 30.0–100.0)

## 2022-04-16 LAB — CARDIOVASCULAR RISK ASSESSMENT

## 2022-04-19 DIAGNOSIS — E441 Mild protein-calorie malnutrition: Secondary | ICD-10-CM | POA: Insufficient documentation

## 2022-04-19 NOTE — Assessment & Plan Note (Signed)
The current medical regimen is effective;  continue present plan and medications. ?Continue advair ?

## 2022-04-19 NOTE — Assessment & Plan Note (Signed)
Recommend continue three meals per day.  Protein shakes recommended.

## 2022-04-19 NOTE — Assessment & Plan Note (Signed)
Management per specialist.  Continue percocet.

## 2022-04-19 NOTE — Assessment & Plan Note (Signed)
Continue Evenity 2 shots monthly.

## 2022-04-19 NOTE — Assessment & Plan Note (Signed)
Continue mirapex 1 mg one before bed.

## 2022-04-19 NOTE — Assessment & Plan Note (Signed)
The current medical regimen is effective;  continue present plan and medications. Continue zoloft 100 mg 2 daily and clonazepam 1 mg twice daily as needed. None needed for several months.

## 2022-04-19 NOTE — Assessment & Plan Note (Signed)
Well controlled.  °No changes to medicines. Continue lipitor 10 mg daily.  °Continue to work on eating a healthy diet and exercise.  °Labs drawn today.  ° °

## 2022-04-19 NOTE — Assessment & Plan Note (Signed)
Continue oxygen 2 L at night.  

## 2022-04-22 ENCOUNTER — Other Ambulatory Visit: Payer: Self-pay | Admitting: Family Medicine

## 2022-04-27 ENCOUNTER — Ambulatory Visit (INDEPENDENT_AMBULATORY_CARE_PROVIDER_SITE_OTHER): Payer: Medicare Other

## 2022-04-27 DIAGNOSIS — M810 Age-related osteoporosis without current pathological fracture: Secondary | ICD-10-CM | POA: Diagnosis not present

## 2022-04-27 MED ORDER — ROMOSOZUMAB-AQQG 105 MG/1.17ML ~~LOC~~ SOSY
210.0000 mg | PREFILLED_SYRINGE | Freq: Once | SUBCUTANEOUS | Status: AC
  Administered 2022-04-27: 210 mg via SUBCUTANEOUS

## 2022-04-27 NOTE — Progress Notes (Signed)
   Evenity injection given per order, patient tolerated well. Next month patient will be transitioned to Prolia.  Erie Noe, LPN 4:09 AM

## 2022-05-13 ENCOUNTER — Telehealth: Payer: Medicare Other | Admitting: Family Medicine

## 2022-05-13 DIAGNOSIS — U071 COVID-19: Secondary | ICD-10-CM

## 2022-05-13 MED ORDER — BENZONATATE 100 MG PO CAPS: 100.0000 mg | ORAL_CAPSULE | Freq: Three times a day (TID) | ORAL | 0 refills | Status: DC | PRN

## 2022-05-13 MED ORDER — MOLNUPIRAVIR EUA 200MG CAPSULE: 4.0000 | ORAL_CAPSULE | Freq: Two times a day (BID) | ORAL | 0 refills | Status: AC

## 2022-05-13 NOTE — Progress Notes (Signed)
Virtual Visit Consent   Ellen Holloway, you are scheduled for a virtual visit with a [facility] provider today. Just as with appointments in the office, your consent must be obtained to participate. Your consent will be active for this visit and any virtual visit you may have with one of our providers in the next 365 days. If you have a MyChart account, a copy of this consent can be sent to you electronically.  As this is a virtual visit, video technology does not allow for your provider to perform a traditional examination. This may limit your provider's ability to fully assess your condition. If your provider identifies any concerns that need to be evaluated in person or the need to arrange testing (such as labs, EKG, etc.), we will make arrangements to do so. Although advances in technology are sophisticated, we cannot ensure that it will always work on either your end or our end. If the connection with a video visit is poor, the visit may have to be switched to a telephone visit. With either a video or telephone visit, we are not always able to ensure that we have a secure connection.  By engaging in this virtual visit, you consent to the provision of healthcare and authorize for your insurance to be billed (if applicable) for the services provided during this visit. Depending on your insurance coverage, you may receive a charge related to this service.  I need to obtain your verbal consent now. Are you willing to proceed with your visit today? Ellen Holloway has provided verbal consent on 05/13/2022 for a virtual visit (video or telephone). Ellen Mayo, NP  Date: 05/13/2022 10:58 AM  Virtual Visit via Video Note   I, Ellen Holloway, connected with  Ellen Holloway  ([id number], 08/16/1950) on 05/13/22 at 11:00 AM EST by a video-enabled telemedicine application and verified that I am speaking with the correct person using two identifiers.  Location: Patient: Virtual Visit Location Patient:  Home Provider: Virtual Visit Location Provider: Home Office   I discussed the limitations of evaluation and management by telemedicine and the availability of in person appointments. The patient expressed understanding and agreed to proceed.    History of Present Illness: Ellen Holloway is a 72 y.o. who identifies as a female who was assigned female at birth, and is being seen today for COVID Onset was yesterday afternoon- feeling poorly Associated symptoms are headache, body aches, fever- 99.2, sinus congestion and drainage, cough, oxygen level is mid 80's- has O2 at home- due to COPD will wear now Modifying factors are none Denies chest pain, shortness of breath, fevers, chills History of COPD and Chronic RF, ex-smoker Exposure to sick contacts- unknown- but husband is + as well COVID test: last night Covid + and retested this am as well Vaccines: vaccines- all shots, and 4 boosters.  Of note: Last Jan 2023- developed COVID and was admitted to hospital due to illness.   Problems:  Patient Active Problem List   Diagnosis Date Noted   Mild protein-calorie malnutrition ([place]) 04/19/2022   GERD (gastroesophageal reflux disease) 12/13/2021   COPD (chronic obstructive pulmonary disease) ([place]) 12/13/2021   Need for influenza vaccination 12/13/2021   Muscle cramps 12/13/2021   Chronic pain syndrome 11/19/2020   Encounter for screening mammogram for malignant neoplasm of breast 11/19/2020   Chronic respiratory failure with hypoxia ([place]) 11/19/2020   Mixed hyperlipidemia 11/24/2019   Seasonal allergic rhinitis due to pollen 08/27/2019   RLS (restless  legs syndrome) 08/27/2019   Chronic midline low back pain without sciatica 08/27/2019   Osteoporosis, postmenopausal 04/30/2019   Depression, major, recurrent, mild ([place]) 04/27/2019    Allergies:  Allergies  Allergen Reactions   Sulfamethoxazole Anaphylaxis   Sulfa Antibiotics Hives   Medications:  Current Outpatient Medications:     ADVAIR DISKUS 250-50 MCG/ACT AEPB, Inhale 1 puff into the lungs 2 (two) times daily., Disp: , Rfl:    albuterol (VENTOLIN HFA) 108 (90 Base) MCG/ACT inhaler, Inhale 2 puffs into the lungs every 6 (six) hours as needed for wheezing or shortness of breath., Disp: 8 g, Rfl: 0   atorvastatin (LIPITOR) 10 MG tablet, TAKE 1 TABLET BY MOUTH EVERY DAY, Disp: 90 tablet, Rfl: 1   BIOTIN PO, Take by mouth., Disp: , Rfl:    calcium carbonate (OSCAL) 1500 (600 Ca) MG TABS tablet, Take 600 mg of elemental calcium by mouth daily., Disp: , Rfl:    clonazePAM (KLONOPIN) 1 MG tablet, Take 1 tablet (1 mg total) by mouth daily as needed for anxiety., Disp: 30 tablet, Rfl: 1   COLLAGEN PO, Take by mouth., Disp: , Rfl:    cyclobenzaprine (FLEXERIL) 10 MG tablet, TAKE 1 TABLET BY MOUTH EVERYDAY AT BEDTIME, Disp: 90 tablet, Rfl: 1   famotidine (PEPCID) 40 MG tablet, TAKE 1 TABLET BY MOUTH EVERYDAY AT BEDTIME (Patient taking differently: Take 80 mg by mouth at bedtime.), Disp: 90 tablet, Rfl: 2   feeding supplement (BOOST HIGH PROTEIN) LIQD, Take 1 Container by mouth in the morning and at bedtime., Disp: , Rfl:    furosemide (LASIX) 20 MG tablet, TAKE 1 TABLET (20 MG TOTAL) BY MOUTH DAILY AS NEEDED FOR SWELLING, Disp: 90 tablet, Rfl: 1   oxyCODONE-acetaminophen (PERCOCET) 10-325 MG tablet, Take 1 tablet by mouth every 6 (six) hours., Disp: , Rfl:    OXYGEN, Inhale 3 L into the lungs. 2 L at night., Disp: , Rfl:    pramipexole (MIRAPEX) 1 MG tablet, TAKE 1 TABLET BY MOUTH EVERYDAY AT BEDTIME, Disp: 90 tablet, Rfl: 1   promethazine (PHENERGAN) 25 MG tablet, Take 1 tablet (25 mg total) by mouth every 6 (six) hours as needed for nausea or vomiting., Disp: 30 tablet, Rfl: 1   sertraline (ZOLOFT) 100 MG tablet, TAKE 2 TABLETS BY MOUTH EVERY DAY, Disp: 180 tablet, Rfl: 0   spironolactone (ALDACTONE) 25 MG tablet, TAKE 1 TABLET BY MOUTH EVERY DAY, Disp: 90 tablet, Rfl: 0   VITAMIN D-VITAMIN K PO, Take 1 tablet by mouth daily.,  Disp: , Rfl:   Observations/Objective: Patient is well-developed, well-nourished in no acute distress.  Resting comfortably  at home.  Head is normocephalic, atraumatic.  No labored breathing.  Speech is clear and coherent with logical content.  Patient is alert and oriented at baseline.    Assessment and Plan:  1. COVID-19  - benzonatate (TESSALON) 100 MG capsule; Take 1 capsule (100 mg total) by mouth 3 (three) times daily as needed.  Dispense: 30 capsule; Refill: 0 - molnupiravir EUA (LAGEVRIO) 200 mg CAPS capsule; Take 4 capsules (800 mg total) by mouth 2 (two) times daily for 5 days.  Dispense: 40 capsule; Refill: 0   - Continue OTC symptomatic management of choice -given lower O2 levels- pt is talking well and in complete sentences- advised to wear o2 and montior for drops- and to be seen ASAP for changes. Wears o2 at night normally- hx copd - Take as prescribed - Push fluids - Rest as needed - Discussed  return precautions and when to seek in-person evaluation, sent via AVS as well   Reviewed side effects, risks and benefits of medication.    Patient acknowledged agreement and understanding of the plan.   Past Medical, Surgical, Social History, Allergies, and Medications have been Reviewed.    Follow Up Instructions: I discussed the assessment and treatment plan with the patient. The patient was provided an opportunity to ask questions and all were answered. The patient agreed with the plan and demonstrated an understanding of the instructions.  A copy of instructions were sent to the patient via MyChart unless otherwise noted below.    The patient was advised to call back or seek an in-person evaluation if the symptoms worsen or if the condition fails to improve as anticipated.  Time:  I spent 10 minutes with the patient via telehealth technology discussing the above problems/concerns.    Ellen Mayo, NP

## 2022-05-13 NOTE — Patient Instructions (Addendum)
Ellen Holloway, thank you for joining Perlie Mayo, NP for today's virtual visit.  While this provider is not your primary care provider (PCP), if your PCP is located in our provider database this encounter information will be shared with them immediately following your visit.   [facility] account gives you access to today's visit and all your visits, tests, and labs performed at [facility] " click here if you don't have a [facility] account or go to [email or web address]  Consent: (Patient) Ellen Holloway provided verbal consent for this virtual visit at the beginning of the encounter.  Current Medications:  Current Outpatient Medications:    benzonatate (TESSALON) 100 MG capsule, Take 1 capsule (100 mg total) by mouth 3 (three) times daily as needed., Disp: 30 capsule, Rfl: 0   molnupiravir EUA (LAGEVRIO) 200 mg CAPS capsule, Take 4 capsules (800 mg total) by mouth 2 (two) times daily for 5 days., Disp: 40 capsule, Rfl: 0   ADVAIR DISKUS 250-50 MCG/ACT AEPB, Inhale 1 puff into the lungs 2 (two) times daily., Disp: , Rfl:    albuterol (VENTOLIN HFA) 108 (90 Base) MCG/ACT inhaler, Inhale 2 puffs into the lungs every 6 (six) hours as needed for wheezing or shortness of breath., Disp: 8 g, Rfl: 0   atorvastatin (LIPITOR) 10 MG tablet, TAKE 1 TABLET BY MOUTH EVERY DAY, Disp: 90 tablet, Rfl: 1   BIOTIN PO, Take by mouth., Disp: , Rfl:    calcium carbonate (OSCAL) 1500 (600 Ca) MG TABS tablet, Take 600 mg of elemental calcium by mouth daily., Disp: , Rfl:    clonazePAM (KLONOPIN) 1 MG tablet, Take 1 tablet (1 mg total) by mouth daily as needed for anxiety., Disp: 30 tablet, Rfl: 1   COLLAGEN PO, Take by mouth., Disp: , Rfl:    cyclobenzaprine (FLEXERIL) 10 MG tablet, TAKE 1 TABLET BY MOUTH EVERYDAY AT BEDTIME, Disp: 90 tablet, Rfl: 1   famotidine (PEPCID) 40 MG tablet, TAKE 1 TABLET BY MOUTH EVERYDAY AT BEDTIME (Patient taking differently: Take 80 mg by mouth  at bedtime.), Disp: 90 tablet, Rfl: 2   feeding supplement (BOOST HIGH PROTEIN) LIQD, Take 1 Container by mouth in the morning and at bedtime., Disp: , Rfl:    furosemide (LASIX) 20 MG tablet, TAKE 1 TABLET (20 MG TOTAL) BY MOUTH DAILY AS NEEDED FOR SWELLING, Disp: 90 tablet, Rfl: 1   oxyCODONE-acetaminophen (PERCOCET) 10-325 MG tablet, Take 1 tablet by mouth every 6 (six) hours., Disp: , Rfl:    OXYGEN, Inhale 3 L into the lungs. 2 L at night., Disp: , Rfl:    pramipexole (MIRAPEX) 1 MG tablet, TAKE 1 TABLET BY MOUTH EVERYDAY AT BEDTIME, Disp: 90 tablet, Rfl: 1   promethazine (PHENERGAN) 25 MG tablet, Take 1 tablet (25 mg total) by mouth every 6 (six) hours as needed for nausea or vomiting., Disp: 30 tablet, Rfl: 1   sertraline (ZOLOFT) 100 MG tablet, TAKE 2 TABLETS BY MOUTH EVERY DAY, Disp: 180 tablet, Rfl: 0   spironolactone (ALDACTONE) 25 MG tablet, TAKE 1 TABLET BY MOUTH EVERY DAY, Disp: 90 tablet, Rfl: 0   VITAMIN D-VITAMIN K PO, Take 1 tablet by mouth daily., Disp: , Rfl:    Medications ordered in this encounter:  Meds ordered this encounter  Medications   benzonatate (TESSALON) 100 MG capsule    Sig: Take 1 capsule (100 mg total) by mouth 3 (three) times daily as needed.    Dispense:  30 capsule  Refill:  0    Order Specific Question:   Supervising Provider    Answer:   Chase Picket [id number]   molnupiravir EUA (LAGEVRIO) 200 mg CAPS capsule    Sig: Take 4 capsules (800 mg total) by mouth 2 (two) times daily for 5 days.    Dispense:  40 capsule    Refill:  0    Order Specific Question:   Supervising Provider    Answer:   Chase Picket [id number]     *If you need refills on other medications prior to your next appointment, please contact your pharmacy*  Follow-Up: Call back or seek an in-person evaluation if the symptoms worsen or if the condition fails to improve as anticipated.  [facility] [phone]  Care Instructions:  - Continue OTC  symptomatic management of choice - Take as prescribed - Push fluids - Rest as needed   Isolation Instructions: You are to isolate at home for 5 days from onset of your symptoms. If you must be around other household members who do not have symptoms, you need to make sure that both you and the family members are masking consistently with a high-quality mask.  After day 5 of isolation, if you have had no fever within 24 hours and you are feeling better, you can end isolation but need to mask for an additional 5 days.  After day 5 if you have a fever or are having significant symptoms, please isolate for full 10 days.  If you note any worsening of symptoms despite treatment, please seek an in-person evaluation ASAP. If you note any significant shortness of breath or any chest pain, please seek ER evaluation. Please do not delay care!   COVID-19: What to Do if You Are Sick If you test positive and are an older adult or someone who is at high risk of getting very sick from COVID-19, treatment may be available. Contact a healthcare provider right away after a positive test to determine if you are eligible, even if your symptoms are mild right now. You can also visit a Test to Treat location and, if eligible, receive a prescription from a provider. Don't delay: Treatment must be started within the first few days to be effective. If you have a fever, cough, or other symptoms, you might have COVID-19. Most people have mild illness and are able to recover at home. If you are sick: Keep track of your symptoms. If you have an emergency warning sign (including trouble breathing), call 911. Steps to help prevent the spread of COVID-19 if you are sick If you are sick with COVID-19 or think you might have COVID-19, follow the steps below to care for yourself and to help protect other people in your home and community. Stay home except to get medical care Stay home. Most people with COVID-19 have mild illness  and can recover at home without medical care. Do not leave your home, except to get medical care. Do not visit public areas and do not go to places where you are unable to wear a mask. Take care of yourself. Get rest and stay hydrated. Take over-the-counter medicines, such as acetaminophen, to help you feel better. Stay in touch with your doctor. Call before you get medical care. Be sure to get care if you have trouble breathing, or have any other emergency warning signs, or if you think it is an emergency. Avoid public transportation, ride-sharing, or taxis if possible. Get tested If you  have symptoms of COVID-19, get tested. While waiting for test results, stay away from others, including staying apart from those living in your household. Get tested as soon as possible after your symptoms start. Treatments may be available for people with COVID-19 who are at risk for becoming very sick. Don't delay: Treatment must be started early to be effective--some treatments must begin within 5 days of your first symptoms. Contact your healthcare provider right away if your test result is positive to determine if you are eligible. Self-tests are one of several options for testing for the virus that causes COVID-19 and may be more convenient than laboratory-based tests and point-of-care tests. Ask your healthcare provider or your local health department if you need help interpreting your test results. You can visit your state, tribal, local, and territorial health department's website to look for the latest local information on testing sites. Separate yourself from other people As much as possible, stay in a specific room and away from other people and pets in your home. If possible, you should use a separate bathroom. If you need to be around other people or animals in or outside of the home, wear a well-fitting mask. Tell your close contacts that they may have been exposed to COVID-19. An infected person can  spread COVID-19 starting 48 hours (or 2 days) before the person has any symptoms or tests positive. By letting your close contacts know they may have been exposed to COVID-19, you are helping to protect everyone. See COVID-19 and Animals if you have questions about pets. If you are diagnosed with COVID-19, someone from the health department may call you. Answer the call to slow the spread. Monitor your symptoms Symptoms of COVID-19 include fever, cough, or other symptoms. Follow care instructions from your healthcare provider and local health department. Your local health authorities may give instructions on checking your symptoms and reporting information. When to seek emergency medical attention Look for emergency warning signs* for COVID-19. If someone is showing any of these signs, seek emergency medical care immediately: Trouble breathing Persistent pain or pressure in the chest New confusion Inability to wake or stay awake Pale, gray, or blue-colored skin, lips, or nail beds, depending on skin tone *This list is not all possible symptoms. Please call your medical provider for any other symptoms that are severe or concerning to you. Call 911 or call ahead to your local emergency facility: Notify the operator that you are seeking care for someone who has or may have COVID-19. Call ahead before visiting your doctor Call ahead. Many medical visits for routine care are being postponed or done by phone or telemedicine. If you have a medical appointment that cannot be postponed, call your doctor's office, and tell them you have or may have COVID-19. This will help the office protect themselves and other patients. If you are sick, wear a well-fitting mask You should wear a mask if you must be around other people or animals, including pets (even at home). Wear a mask with the best fit, protection, and comfort for you. You don't need to wear the mask if you are alone. If you can't put on a mask  (because of trouble breathing, for example), cover your coughs and sneezes in some other way. Try to stay at least 6 feet away from other people. This will help protect the people around you. Masks should not be placed on young children under age 65 years, anyone who has trouble breathing, or anyone who is not  able to remove the mask without help. Cover your coughs and sneezes Cover your mouth and nose with a tissue when you cough or sneeze. Throw away used tissues in a lined trash can. Immediately wash your hands with soap and water for at least 20 seconds. If soap and water are not available, clean your hands with an alcohol-based hand sanitizer that contains at least 60% alcohol. Clean your hands often Wash your hands often with soap and water for at least 20 seconds. This is especially important after blowing your nose, coughing, or sneezing; going to the bathroom; and before eating or preparing food. Use hand sanitizer if soap and water are not available. Use an alcohol-based hand sanitizer with at least 60% alcohol, covering all surfaces of your hands and rubbing them together until they feel dry. Soap and water are the best option, especially if hands are visibly dirty. Avoid touching your eyes, nose, and mouth with unwashed hands. Handwashing Tips Avoid sharing personal household items Do not share dishes, drinking glasses, cups, eating utensils, towels, or bedding with other people in your home. Wash these items thoroughly after using them with soap and water or put in the dishwasher. Clean surfaces in your home regularly Clean and disinfect high-touch surfaces (for example, doorknobs, tables, handles, light switches, and countertops) in your "sick room" and bathroom. In shared spaces, you should clean and disinfect surfaces and items after each use by the person who is ill. If you are sick and cannot clean, a caregiver or other person should only clean and disinfect the area around you  (such as your bedroom and bathroom) on an as needed basis. Your caregiver/other person should wait as long as possible (at least several hours) and wear a mask before entering, cleaning, and disinfecting shared spaces that you use. Clean and disinfect areas that may have blood, stool, or body fluids on them. Use household cleaners and disinfectants. Clean visible dirty surfaces with household cleaners containing soap or detergent. Then, use a household disinfectant. Use a product from [employer] List N: Disinfectants for Coronavirus ([id number]). Be sure to follow the instructions on the label to ensure safe and effective use of the product. Many products recommend keeping the surface wet with a disinfectant for a certain period of time (look at "contact time" on the product label). You may also need to wear personal protective equipment, such as gloves, depending on the directions on the product label. Immediately after disinfecting, wash your hands with soap and water for 20 seconds. For completed guidance on cleaning and disinfecting your home, visit Complete Disinfection Guidance. Take steps to improve ventilation at home Improve ventilation (air flow) at home to help prevent from spreading COVID-19 to other people in your household. Clear out COVID-19 virus particles in the air by opening windows, using air filters, and turning on fans in your home. Use this interactive tool to learn how to improve air flow in your home. When you can be around others after being sick with COVID-19 Deciding when you can be around others is different for different situations. Find out when you can safely end home isolation. For any additional questions about your care, contact your healthcare provider or state or local health department. 06/04/2020 Content source: [facility] ([facility]), Division of Viral Diseases This information is not intended to replace advice given to you  by your health care provider. Make sure you discuss any questions you have with your health care provider. Document Revised:  07/18/2020 Document Reviewed: 07/18/2020 Elsevier Patient Education  2022 [employer].       If you have been instructed to have an in-person evaluation today at a local Urgent Care facility, please use the link below. It will take you to a list of all of our available [facility], including address, phone number and hours of operation. Please do not delay care.  [facility]  If you or a family member do not have a primary care provider, use the link below to schedule a visit and establish care. When you choose a [facility] primary care physician or advanced practice provider, you gain a long-term partner in health. Find a Primary Care Provider  Learn more about 's in-office and virtual care options: [facility]

## 2022-05-16 ENCOUNTER — Encounter: Payer: Self-pay | Admitting: Family Medicine

## 2022-05-27 ENCOUNTER — Ambulatory Visit (INDEPENDENT_AMBULATORY_CARE_PROVIDER_SITE_OTHER): Payer: Medicare Other

## 2022-05-27 DIAGNOSIS — M810 Age-related osteoporosis without current pathological fracture: Secondary | ICD-10-CM

## 2022-05-27 MED ORDER — DENOSUMAB 60 MG/ML ~~LOC~~ SOSY
60.0000 mg | PREFILLED_SYRINGE | SUBCUTANEOUS | Status: AC
  Administered 2022-05-27 – 2022-11-30 (×2): 60 mg via SUBCUTANEOUS

## 2022-05-27 NOTE — Progress Notes (Signed)
   Prolia injection given per order, patient tolerated well.   Erie Noe, LPN 66:29 AM

## 2022-06-06 ENCOUNTER — Other Ambulatory Visit: Payer: Self-pay | Admitting: Family Medicine

## 2022-06-06 DIAGNOSIS — F321 Major depressive disorder, single episode, moderate: Secondary | ICD-10-CM

## 2022-07-04 ENCOUNTER — Other Ambulatory Visit: Payer: Self-pay | Admitting: Family Medicine

## 2022-08-11 ENCOUNTER — Other Ambulatory Visit: Payer: Self-pay | Admitting: Family Medicine

## 2022-08-14 ENCOUNTER — Other Ambulatory Visit: Payer: Self-pay | Admitting: Family Medicine

## 2022-08-14 DIAGNOSIS — E782 Mixed hyperlipidemia: Secondary | ICD-10-CM

## 2022-09-04 ENCOUNTER — Other Ambulatory Visit: Payer: Self-pay | Admitting: Family Medicine

## 2022-09-04 DIAGNOSIS — F321 Major depressive disorder, single episode, moderate: Secondary | ICD-10-CM

## 2022-10-05 ENCOUNTER — Other Ambulatory Visit: Payer: Self-pay | Admitting: Family Medicine

## 2022-10-20 ENCOUNTER — Other Ambulatory Visit: Payer: Self-pay | Admitting: Family Medicine

## 2022-11-14 ENCOUNTER — Other Ambulatory Visit: Payer: Self-pay | Admitting: Family Medicine

## 2022-11-14 DIAGNOSIS — E782 Mixed hyperlipidemia: Secondary | ICD-10-CM

## 2022-11-17 ENCOUNTER — Encounter: Payer: Self-pay | Admitting: Family Medicine

## 2022-11-30 ENCOUNTER — Ambulatory Visit (INDEPENDENT_AMBULATORY_CARE_PROVIDER_SITE_OTHER): Payer: Medicare Other

## 2022-11-30 DIAGNOSIS — M810 Age-related osteoporosis without current pathological fracture: Secondary | ICD-10-CM | POA: Diagnosis not present

## 2022-11-30 NOTE — Progress Notes (Signed)
Patient tolerated well prolia injection. She will come back in 6 months around 05/30/23.

## 2022-12-10 ENCOUNTER — Other Ambulatory Visit: Payer: Self-pay | Admitting: Physician Assistant

## 2022-12-10 DIAGNOSIS — F321 Major depressive disorder, single episode, moderate: Secondary | ICD-10-CM

## 2023-01-15 ENCOUNTER — Other Ambulatory Visit: Payer: Self-pay | Admitting: Medical Genetics

## 2023-01-15 DIAGNOSIS — Z006 Encounter for examination for normal comparison and control in clinical research program: Secondary | ICD-10-CM

## 2023-01-19 NOTE — Progress Notes (Addendum)
Subjective:   Ellen Holloway is a 72 y.o. female who presents for Medicare Annual (Subsequent) preventive examination.  This wellness visit is conducted by a nurse.  The patient's medications were reviewed and reconciled since the patient's last visit.  History details were provided by the patient.  The history appears to be reliable.    Medical History: Patient history and Family history was reviewed  Medications, Allergies, and preventative health maintenance was reviewed and updated.   Visit Complete: In person  Patient Medicare AWV questionnaire was completed by the patient on 01/20/23; I have confirmed that all information answered by patient is correct and no changes since this date.  Cardiac Risk Factors include: advanced age (>67men, >81 women)     Objective:    Today's Vitals   01/20/23 0934  BP: 112/68  Pulse: 64  Resp: 16  Weight: 99 lb 9.6 oz (45.2 kg)  Height: 5\' 3"  (1.6 m)   Body mass index is 17.64 kg/m.     01/20/2023   10:38 AM 12/26/2020   10:09 AM 08/30/2019    9:23 AM  Advanced Directives  Does Patient Have a Medical Advance Directive? No No No  Would patient like information on creating a medical advance directive? No - Patient declined Yes (MAU/Ambulatory/Procedural Areas - Information given) No - Patient declined    Current Medications (verified) Outpatient Encounter Medications as of 01/20/2023  Medication Sig   ADVAIR DISKUS 250-50 MCG/ACT AEPB Inhale 1 puff into the lungs 2 (two) times daily.   albuterol (VENTOLIN HFA) 108 (90 Base) MCG/ACT inhaler Inhale 2 puffs into the lungs every 6 (six) hours as needed for wheezing or shortness of breath.   atorvastatin (LIPITOR) 10 MG tablet TAKE 1 TABLET BY MOUTH EVERY DAY   benzonatate (TESSALON) 100 MG capsule Take 1 capsule (100 mg total) by mouth 3 (three) times daily as needed.   BIOTIN PO Take by mouth.   calcium carbonate (OSCAL) 1500 (600 Ca) MG TABS tablet Take 600 mg of elemental calcium by  mouth daily.   clonazePAM (KLONOPIN) 1 MG tablet Take 1 tablet (1 mg total) by mouth daily as needed for anxiety.   COLLAGEN PO Take by mouth.   cyclobenzaprine (FLEXERIL) 10 MG tablet TAKE 1 TABLET BY MOUTH EVERYDAY AT BEDTIME   famotidine (PEPCID) 40 MG tablet TAKE 1 TABLET BY MOUTH EVERYDAY AT BEDTIME (Patient taking differently: Take 80 mg by mouth at bedtime.)   feeding supplement (BOOST HIGH PROTEIN) LIQD Take 1 Container by mouth in the morning and at bedtime.   furosemide (LASIX) 20 MG tablet TAKE 1 TABLET (20 MG TOTAL) BY MOUTH DAILY AS NEEDED FOR SWELLING   oxyCODONE-acetaminophen (PERCOCET) 10-325 MG tablet Take 1 tablet by mouth every 6 (six) hours.   OXYGEN Inhale 3 L into the lungs. 2 L at night.   pramipexole (MIRAPEX) 1 MG tablet TAKE 1 TABLET BY MOUTH EVERYDAY AT BEDTIME   promethazine (PHENERGAN) 25 MG tablet Take 1 tablet (25 mg total) by mouth every 6 (six) hours as needed for nausea or vomiting.   sertraline (ZOLOFT) 100 MG tablet TAKE 2 TABLETS BY MOUTH EVERY DAY   spironolactone (ALDACTONE) 25 MG tablet TAKE 1 TABLET BY MOUTH EVERY DAY   VITAMIN D-VITAMIN K PO Take 1 tablet by mouth daily.   Facility-Administered Encounter Medications as of 01/20/2023  Medication   denosumab (PROLIA) injection 60 mg    Allergies (verified) Sulfamethoxazole and Sulfa antibiotics   History: Past Medical History:  Diagnosis Date   Acquired absence of both cervix and uterus    Age-related osteoporosis with current pathological fracture, unspecified site, initial encounter for fracture    Asthma    Barrett's esophagus with low grade dysplasia    Chronic obstructive pulmonary disease (COPD) (HCC)    Major depressive disorder, recurrent severe without psychotic features (HCC)    Mixed hyperlipidemia    Obstructive sleep apnea (adult) (pediatric)    Other obesity due to excess calories    Other pulmonary embolism without acute cor pulmonale (HCC)    Other specified anxiety disorders     Sleep related leg cramps    Telogen effluvium    Urticaria    Past Surgical History:  Procedure Laterality Date   ABDOMINAL HYSTERECTOMY  1980   partial   BREAST BIOPSY Left 02/12/2022   [place] LT BREAST BX W LOC DEV 1ST LESION IMG BX SPEC US GUIDE 02/12/2022 [facility]   right wrist 1985     Family History  Problem Relation Age of Onset   Obesity Mother    Heart attack Father    Allergic rhinitis Sister    Thyroid cancer Daughter    Asthma Brother    Renal Disease Brother    Breast cancer Neg Hx    Social History   Socioeconomic History   Marital status: Married    Spouse name: Mellody Dance   Number of children: 2   Years of education: Not on file   Highest education level: Not on file  Occupational History   Not on file  Tobacco Use   Smoking status: Former    Current packs/day: 0.00    Average packs/day: 0.5 packs/day for 55.0 years (27.5 ttl pk-yrs)    Types: E-cigarettes, Cigarettes    Start date: 1964    Quit date: 2019    Years since quitting: 5.8   Smokeless tobacco: Never   Tobacco comments:    Patient used to smoke up to 2 PPD.  Substance and Sexual Activity   Alcohol use: Yes    Alcohol/week: 1.0 standard drink of alcohol    Types: 1 Cans of beer per week    Comment: occasional beer   Drug use: Never   Sexual activity: Not on file  Other Topics Concern   Not on file  Social History Narrative   Daughter - lives in [place], [place] - lives out of state - both are very successful.   Social Determinants of Health   Financial Resource Strain: Medium Risk (01/20/2023)   Overall Financial Resource Strain (CARDIA)    Difficulty of Paying Living Expenses: Somewhat hard  Food Insecurity: Food Insecurity Present (01/20/2023)   Hunger Vital Sign    Worried About Running Out of Food in the Last Year: Sometimes true    Ran Out of Food in the Last Year: Sometimes true  Transportation Needs: No Transportation Needs (01/20/2023)   PRAPARE - Therapist, art (Medical): No    Lack of Transportation (Non-Medical): No  Physical Activity: Insufficiently Active (01/20/2023)   Exercise Vital Sign    Days of Exercise per Week: 5 days    Minutes of Exercise per Session: 20 min  Stress: Stress Concern Present (01/20/2023)   [employer] of Occupational Health - Occupational Stress Questionnaire    Feeling of Stress : To some extent  Social Connections: Unknown (01/20/2023)   Social Connection and Isolation Panel [NHANES]    Frequency of Communication with Friends and Family: More  than three times a week    Frequency of Social Gatherings with Friends and Family: Once a week    Attends Religious Services: Patient declined    Database administrator or Organizations: No    Attends Banker Meetings: Never    Marital Status: Married    Tobacco Counseling Counseling given: Not Answered Tobacco comments: Patient used to smoke up to 2 PPD.   Clinical Intake:  Pre-visit preparation completed: Yes  Pain : No/denies pain     Nutritional Status: BMI <19  Underweight Nutritional Risks: None Diabetes: No  How often do you need to have someone help you when you read instructions, pamphlets, or other written materials from your doctor or pharmacy?: 1 - Never  Interpreter Needed?: No      Activities of Daily Living    01/20/2023    8:58 AM  In your present state of health, do you have any difficulty performing the following activities:  Hearing? 0  Vision? 0  Difficulty concentrating or making decisions? 0  Walking or climbing stairs? 0  Dressing or bathing? 0  Doing errands, shopping? 0  Preparing Food and eating ? N  Using the Toilet? N  In the past six months, have you accidently leaked urine? N  Do you have problems with loss of bowel control? N  Managing your Medications? N  Managing your Finances? N  Housekeeping or managing your Housekeeping? N    Patient Care Team: Blane Ohara, MD as PCP  - General (Internal Medicine) Misenheimer, Marcial Pacas, MD as Consulting Physician (Gastroenterology) Marcellus Scott, MD as Referring Physician (Pulmonary Disease) Lucie Leather Alvira Philips, MD as Consulting Physician (Allergy and Immunology) Florene Glen (Orthopedic Surgery)  Indicate any recent Medical Services you may have received from other than [facility] providers in the past year (date may be approximate).     Assessment:   This is a routine wellness examination for [place].  Hearing/Vision screen No results found.   Goals Addressed             This Visit's Progress    Gain weight   Not on track    Continue high calorie supplement shakes      Depression Screen    01/20/2023    9:45 AM 04/15/2022   10:05 AM 12/10/2021   10:44 AM 12/10/2021   10:02 AM 08/26/2021    7:36 AM 03/17/2021    5:22 PM 12/26/2020   10:03 AM  PHQ 2/9 Scores  PHQ - 2 Score 0 0 2 0 0 0 0  PHQ- 9 Score  1 7   0     Fall Risk    01/20/2023    8:58 AM 04/15/2022   10:05 AM 01/12/2022    8:33 AM 12/10/2021   10:01 AM 08/26/2021    7:36 AM  Fall Risk   Falls in the past year? 1 1 1 1 1   Number falls in past yr: 0 1 1 0 1  Injury with Fall? 1 0 1 0 0  Risk for fall due to : No Fall Risks History of fall(s) History of fall(s) No Fall Risks No Fall Risks  Follow up Falls evaluation completed;Education provided Falls evaluation completed;Education provided Falls evaluation completed;Education provided Falls evaluation completed Falls evaluation completed    MEDICARE RISK AT HOME: Medicare Risk at Home Any stairs in or around the home?: Yes If so, are there any without handrails?: No Home free of loose throw rugs in walkways, pet  beds, electrical cords, etc?: Yes Adequate lighting in your home to reduce risk of falls?: Yes Life alert?: No Use of a cane, walker or w/c?: No Grab bars in the bathroom?: No Shower chair or bench in shower?: No Elevated toilet seat or a handicapped toilet?: No  TIMED UP  AND GO:  Was the test performed?  Yes  Length of time to ambulate 10 feet: 4 sec Gait steady and fast without use of assistive device    Cognitive Function:        01/20/2023   10:40 AM 01/12/2022   12:27 PM 12/26/2020   10:30 AM 08/31/2019    1:12 PM  6CIT Screen  What Year? 0 points 0 points 0 points 0 points  What month? 0 points 0 points 0 points 0 points  What time? 0 points 0 points 0 points 0 points  Count back from 20 0 points 0 points 0 points 0 points  Months in reverse 0 points 0 points 0 points 2 points  Repeat phrase 0 points 0 points 0 points 2 points  Total Score 0 points 0 points 0 points 4 points    Immunizations Immunization History  Administered Date(s) Administered   Fluad Quad(high Dose 65+) 11/24/2019, 11/19/2020, 12/10/2021   Fluad Trivalent(High Dose 65+) 01/20/2023   Influenza-Unspecified 12/05/2018   Moderna Covid-19 Vaccine Bivalent Booster 21yrs & up 12/26/2020   Moderna Sars-Covid-2 Vaccination 05/17/2019, 06/14/2019, 02/13/2020   Pfizer(Comirnaty)Fall Seasonal Vaccine 12 years and older 01/12/2022, 01/20/2023   Pneumococcal Conjugate-13 11/19/2020   Pneumococcal Polysaccharide-23 12/22/2015   Respiratory Syncytial Virus Vaccine,Recomb Aduvanted(Arexvy) 12/15/2021   Tdap 06/30/2017   Zoster Recombinant(Shingrix) 12/15/2021    TDAP status: Up to date  Flu Vaccine status: Completed at today's visit  Pneumococcal vaccine status: Up to date  Covid-19 vaccine status: Completed vaccines  Qualifies for Shingles Vaccine? Yes   Zostavax completed No   Shingrix Completed?: No.    Education has been provided regarding the importance of this vaccine. Patient has been advised to call insurance company to determine out of pocket expense if they have not yet received this vaccine. Advised may also receive vaccine at local pharmacy or Health Dept. Verbalized acceptance and understanding.  Screening Tests Health Maintenance  Topic Date Due   Lung  Cancer Screening  05/11/2020   Zoster Vaccines- Shingrix (2 of 2) 02/09/2022   MAMMOGRAM  01/08/2023   DEXA SCAN  03/22/2023   Medicare Annual Wellness (AWV)  01/20/2024   Fecal DNA (Cologuard)  01/30/2024   DTaP/Tdap/Td (2 - Td or Tdap) 07/01/2027   Pneumonia Vaccine 77+ Years old  Completed   INFLUENZA VACCINE  Completed   COVID-19 Vaccine  Completed   Hepatitis C Screening  Completed   HPV VACCINES  Aged Out    Health Maintenance  Health Maintenance Due  Topic Date Due   Lung Cancer Screening  05/11/2020   Zoster Vaccines- Shingrix (2 of 2) 02/09/2022   MAMMOGRAM  01/08/2023    Colorectal cancer screening: Type of screening: Cologuard. Completed 01/29/2021. Repeat every 3 years  Mammogram status: Completed 01/07/22. Repeat every year  Bone Density status: Completed 03/21/21. Results reflect: Bone density results: OSTEOPOROSIS. Repeat every 2 years.  Lung Cancer Screening: (Low Dose CT Chest recommended if Age 16-80 years, 20 pack-year currently smoking OR have quit w/in 15years.) does not qualify.    Additional Screening:  Hepatitis C Screening: does not qualify  Vision Screening: Recommended annual ophthalmology exams for early detection of glaucoma and other disorders  of the eye. Is the patient up to date with their annual eye exam?  Yes    Dental Screening: Recommended annual dental exams for proper oral hygiene   Community Resource Referral / Chronic Care Management: CRR required this visit?  No   CCM required this visit?  No     Plan:    1- Flu and COVID vaccines completed today 2- [facility] 3- DEXA ordered to be done @ [place] after January 6  I have personally reviewed and noted the following in the patient's chart:   Medical and social history Use of alcohol, tobacco or illicit drugs  Current medications and supplements including opioid prescriptions.  Functional ability and status Nutritional status Physical  activity Advanced directives List of other physicians Hospitalizations, surgeries, and ER visits in previous 12 months Vitals Screenings to include cognitive, depression, and falls Referrals and appointments  In addition, I have reviewed and discussed with patient certain preventive protocols, quality metrics, and best practice recommendations. A written personalized care plan for preventive services as well as general preventive health recommendations were provided to patient.     Jacklynn Bue, LPN   [id number]/11/[id number]

## 2023-01-19 NOTE — Patient Instructions (Signed)

## 2023-01-20 ENCOUNTER — Ambulatory Visit: Payer: Medicare Other | Admitting: Physician Assistant

## 2023-01-20 VITALS — BP 112/68 | HR 64 | Resp 16 | Ht 63.0 in | Wt 99.6 lb

## 2023-01-20 DIAGNOSIS — R928 Other abnormal and inconclusive findings on diagnostic imaging of breast: Secondary | ICD-10-CM

## 2023-01-20 DIAGNOSIS — M810 Age-related osteoporosis without current pathological fracture: Secondary | ICD-10-CM | POA: Diagnosis not present

## 2023-01-20 DIAGNOSIS — F330 Major depressive disorder, recurrent, mild: Secondary | ICD-10-CM

## 2023-01-20 DIAGNOSIS — Z23 Encounter for immunization: Secondary | ICD-10-CM

## 2023-01-20 DIAGNOSIS — Z0000 Encounter for general adult medical examination without abnormal findings: Secondary | ICD-10-CM | POA: Diagnosis not present

## 2023-01-20 DIAGNOSIS — Z1231 Encounter for screening mammogram for malignant neoplasm of breast: Secondary | ICD-10-CM

## 2023-01-21 ENCOUNTER — Other Ambulatory Visit: Payer: Self-pay | Admitting: Family Medicine

## 2023-01-21 DIAGNOSIS — D1800 Hemangioma unspecified site: Secondary | ICD-10-CM

## 2023-01-21 DIAGNOSIS — M810 Age-related osteoporosis without current pathological fracture: Secondary | ICD-10-CM

## 2023-01-28 ENCOUNTER — Other Ambulatory Visit: Payer: Self-pay

## 2023-01-28 DIAGNOSIS — F330 Major depressive disorder, recurrent, mild: Secondary | ICD-10-CM

## 2023-01-28 MED ORDER — CLONAZEPAM 1 MG PO TABS: 1.0000 mg | ORAL_TABLET | Freq: Every day | ORAL | 1 refills | Status: DC | PRN

## 2023-01-28 MED ORDER — PROMETHAZINE HCL 25 MG PO TABS: 25.0000 mg | ORAL_TABLET | Freq: Four times a day (QID) | ORAL | 1 refills | Status: DC | PRN

## 2023-02-04 ENCOUNTER — Other Ambulatory Visit: Payer: Self-pay | Admitting: Family Medicine

## 2023-02-09 ENCOUNTER — Ambulatory Visit
Admission: RE | Admit: 2023-02-09 | Discharge: 2023-02-09 | Disposition: A | Discharge status: Home / Self Care | Payer: Medicare Other | Source: Ambulatory Visit | Attending: Family Medicine | Admitting: Family Medicine

## 2023-02-09 DIAGNOSIS — D1800 Hemangioma unspecified site: Secondary | ICD-10-CM

## 2023-04-02 ENCOUNTER — Other Ambulatory Visit: Payer: Self-pay | Admitting: Family Medicine

## 2023-04-16 ENCOUNTER — Other Ambulatory Visit: Payer: Self-pay | Admitting: Family Medicine

## 2023-04-16 DIAGNOSIS — F321 Major depressive disorder, single episode, moderate: Secondary | ICD-10-CM

## 2023-04-19 ENCOUNTER — Encounter: Payer: Self-pay | Admitting: Family Medicine

## 2023-04-19 ENCOUNTER — Ambulatory Visit (INDEPENDENT_AMBULATORY_CARE_PROVIDER_SITE_OTHER): Payer: Medicare Other | Admitting: Family Medicine

## 2023-04-19 VITALS — BP 118/74 | HR 76 | Temp 97.8°F | Ht 63.0 in | Wt 103.0 lb

## 2023-04-19 DIAGNOSIS — E441 Mild protein-calorie malnutrition: Secondary | ICD-10-CM | POA: Diagnosis not present

## 2023-04-19 DIAGNOSIS — F330 Major depressive disorder, recurrent, mild: Secondary | ICD-10-CM | POA: Diagnosis not present

## 2023-04-19 DIAGNOSIS — G2581 Restless legs syndrome: Secondary | ICD-10-CM

## 2023-04-19 DIAGNOSIS — M810 Age-related osteoporosis without current pathological fracture: Secondary | ICD-10-CM | POA: Diagnosis not present

## 2023-04-19 DIAGNOSIS — E782 Mixed hyperlipidemia: Secondary | ICD-10-CM

## 2023-04-19 DIAGNOSIS — J9611 Chronic respiratory failure with hypoxia: Secondary | ICD-10-CM

## 2023-04-19 DIAGNOSIS — J410 Simple chronic bronchitis: Secondary | ICD-10-CM

## 2023-04-19 DIAGNOSIS — K219 Gastro-esophageal reflux disease without esophagitis: Secondary | ICD-10-CM

## 2023-04-19 MED ORDER — ALBUTEROL SULFATE HFA 108 (90 BASE) MCG/ACT IN AERS: 2.0000 | INHALATION_SPRAY | Freq: Four times a day (QID) | RESPIRATORY_TRACT | 3 refills | Status: DC | PRN

## 2023-04-19 NOTE — Assessment & Plan Note (Signed)
The current medical regimen is effective;  continue present plan and medications. ?Continue advair ?

## 2023-04-19 NOTE — Assessment & Plan Note (Signed)
Continue oxygen 2 L at night.

## 2023-04-19 NOTE — Progress Notes (Signed)
Subjective:  Patient ID: Ellen Holloway, female    DOB: Apr 29, 1950  Age: 73 y.o. MRN: [id number]  Chief Complaint  Patient presents with   Medical Management of Chronic Issues    HPI   History of Present Illness The patient, with a history of COPD, hyperlipidemia, GERD, anxiety, osteoporosis, and chronic pain, presents for a routine follow-up. She reports feeling 'okay' and has had an 'unusually good year' with no illnesses. She is on Advair for COPD and uses an albuterol inhaler sparingly as needed. She also takes atorvastatin for hyperlipidemia and maintains a physically active lifestyle. For GERD, she is on Pepcid at bedtime. She manages her anxiety with sertraline 200 mg once daily and clonazepam as needed and takes Prolia for osteoporosis, though the timing of her last injection needs to be verified.  She is scheduled to go for bone density tomorrow.  She also takes calcium carbonate and vitamin D with K for osteoporosis, and drinks Boost twice daily. She takes Lasix as needed for swelling, though she reports minimal swelling. She is on chronic pain management with oxycodone for a fracture, which she takes three times daily.  She is seeing the pain clinic.  She also takes pramipexole for restless legs and has a nausea medication for when her other medications cause discomfort. She uses oxygen at night for respiratory failure and occasionally during the day when she has trouble breathing. She is also on spironolactone for fluid retention in her legs.     04/19/2023    8:04 AM 01/20/2023    9:45 AM 04/15/2022   10:05 AM 12/10/2021   10:44 AM 12/10/2021   10:02 AM  Depression screen PHQ 2/9  Decreased Interest 0 0 0 1 0  Down, Depressed, Hopeless 0 0 0 1 0  PHQ - 2 Score 0 0 0 2 0  Altered sleeping 1  1 1    Tired, decreased energy 1  0 1   Change in appetite 2  0 3   Feeling bad or failure about yourself  0  0 0   Trouble concentrating 0  0 0   Moving slowly or fidgety/restless 0  0 0    Suicidal thoughts 0  0 0   PHQ-9 Score 4  1 7    Difficult doing work/chores Not difficult at all  Not difficult at all Not difficult at all         01/20/2023    8:58 AM  Fall Risk   Falls in the past year? 1  Number falls in past yr: 0  Injury with Fall? 1  Risk for fall due to : No Fall Risks  Follow up Falls evaluation completed;Education provided    Patient Care Team: Blane Ohara, MD as PCP - General (Internal Medicine) Misenheimer, Marcial Pacas, MD as Consulting Physician (Gastroenterology) Marcellus Scott, MD as Referring Physician (Pulmonary Disease) Lucie Leather Alvira Philips, MD as Consulting Physician (Allergy and Immunology) Valinda Party, PA-C (Orthopedic Surgery)   Review of Systems  Constitutional:  Negative for chills, fatigue and fever.  HENT:  Negative for congestion, ear pain, rhinorrhea and sore throat.   Respiratory:  Negative for cough and shortness of breath.   Cardiovascular:  Negative for chest pain.  Gastrointestinal:  Negative for abdominal pain, constipation, diarrhea, nausea and vomiting.  Genitourinary:  Negative for dysuria and urgency.  Musculoskeletal:  Negative for back pain and myalgias.  Neurological:  Negative for dizziness, weakness, light-headedness and headaches.  Psychiatric/Behavioral:  Negative for dysphoric mood. The  patient is not nervous/anxious.     Current Outpatient Medications on File Prior to Visit  Medication Sig Dispense Refill   ADVAIR DISKUS 250-50 MCG/ACT AEPB Inhale 1 puff into the lungs 2 (two) times daily.     atorvastatin (LIPITOR) 10 MG tablet TAKE 1 TABLET BY MOUTH EVERY DAY 90 tablet 1   BIOTIN PO Take by mouth.     calcium carbonate (OSCAL) 1500 (600 Ca) MG TABS tablet Take 600 mg of elemental calcium by mouth daily.     clonazePAM (KLONOPIN) 1 MG tablet Take 1 tablet (1 mg total) by mouth daily as needed for anxiety. 30 tablet 1   COLLAGEN PO Take by mouth.     cyclobenzaprine (FLEXERIL) 10 MG tablet TAKE 1 TABLET BY MOUTH  EVERYDAY AT BEDTIME 90 tablet 1   famotidine (PEPCID) 40 MG tablet TAKE 1 TABLET BY MOUTH EVERYDAY AT BEDTIME (Patient taking differently: Take 80 mg by mouth at bedtime.) 90 tablet 2   feeding supplement (BOOST HIGH PROTEIN) LIQD Take 1 Container by mouth in the morning and at bedtime.     furosemide (LASIX) 20 MG tablet TAKE 1 TABLET (20 MG TOTAL) BY MOUTH DAILY AS NEEDED FOR SWELLING 90 tablet 0   oxyCODONE-acetaminophen (PERCOCET) 10-325 MG tablet Take 1 tablet by mouth every 6 (six) hours.     OXYGEN Inhale 3 L into the lungs. 2 L at night.     pramipexole (MIRAPEX) 1 MG tablet TAKE 1 TABLET BY MOUTH EVERYDAY AT BEDTIME 90 tablet 1   promethazine (PHENERGAN) 25 MG tablet Take 1 tablet (25 mg total) by mouth every 6 (six) hours as needed for nausea or vomiting. 30 tablet 1   sertraline (ZOLOFT) 100 MG tablet TAKE 2 TABLETS BY MOUTH EVERY DAY 180 tablet 1   spironolactone (ALDACTONE) 25 MG tablet TAKE 1 TABLET BY MOUTH EVERY DAY 90 tablet 1   VITAMIN D-VITAMIN K PO Take 1 tablet by mouth daily.     Current Facility-Administered Medications on File Prior to Visit  Medication Dose Route Frequency Provider Last Rate Last Admin   denosumab (PROLIA) injection 60 mg  60 mg Subcutaneous Q6 months Brenon Antosh, MD   60 mg at 11/30/22 0941   Past Medical History:  Diagnosis Date   Acquired absence of both cervix and uterus    Age-related osteoporosis with current pathological fracture, unspecified site, initial encounter for fracture    Asthma    Barrett's esophagus with low grade dysplasia    Chronic obstructive pulmonary disease (COPD) (HCC)    Major depressive disorder, recurrent severe without psychotic features (HCC)    Mixed hyperlipidemia    Obstructive sleep apnea (adult) (pediatric)    Other obesity due to excess calories    Other pulmonary embolism without acute cor pulmonale (HCC)    Other specified anxiety disorders    Sleep related leg cramps    Telogen effluvium    Urticaria     Past Surgical History:  Procedure Laterality Date   ABDOMINAL HYSTERECTOMY  1980   partial   BREAST BIOPSY Left 02/12/2022   [place] LT BREAST BX W LOC DEV 1ST LESION IMG BX SPEC US GUIDE 02/12/2022 [facility]   right wrist 1985      Family History  Problem Relation Age of Onset   Obesity Mother    Heart attack Father    Allergic rhinitis Sister    Thyroid cancer Daughter    Asthma Brother    Renal Disease Brother  Breast cancer Neg Hx    Social History   Socioeconomic History   Marital status: Married    Spouse name: Mellody Dance   Number of children: 2   Years of education: Not on file   Highest education level: Not on file  Occupational History   Not on file  Tobacco Use   Smoking status: Former    Current packs/day: 0.00    Average packs/day: 0.5 packs/day for 55.0 years (27.5 ttl pk-yrs)    Types: E-cigarettes, Cigarettes    Start date: 1964    Quit date: 2019    Years since quitting: 6.0   Smokeless tobacco: Never   Tobacco comments:    Patient used to smoke up to 2 PPD.  Substance and Sexual Activity   Alcohol use: Yes    Alcohol/week: 1.0 standard drink of alcohol    Types: 1 Cans of beer per week    Comment: occasional beer   Drug use: Never   Sexual activity: Not on file  Other Topics Concern   Not on file  Social History Narrative   Daughter - lives in [place], [place] - lives out of state - both are very successful.   Social Drivers of Health   Financial Resource Strain: Medium Risk (01/20/2023)   Overall Financial Resource Strain (CARDIA)    Difficulty of Paying Living Expenses: Somewhat hard  Food Insecurity: Food Insecurity Present (01/20/2023)   Hunger Vital Sign    Worried About Running Out of Food in the Last Year: Sometimes true    Ran Out of Food in the Last Year: Sometimes true  Transportation Needs: No Transportation Needs (01/20/2023)   PRAPARE - Administrator, Civil Service (Medical): No    Lack of Transportation  (Non-Medical): No  Physical Activity: Insufficiently Active (01/20/2023)   Exercise Vital Sign    Days of Exercise per Week: 5 days    Minutes of Exercise per Session: 20 min  Stress: Stress Concern Present (01/20/2023)   [employer] of Occupational Health - Occupational Stress Questionnaire    Feeling of Stress : To some extent  Social Connections: Unknown (01/20/2023)   Social Connection and Isolation Panel [NHANES]    Frequency of Communication with Friends and Family: More than three times a week    Frequency of Social Gatherings with Friends and Family: Once a week    Attends Religious Services: Patient declined    Database administrator or Organizations: No    Attends Engineer, structural: Never    Marital Status: Married    Objective:  BP 118/74   Pulse 76   Temp 97.8 F (36.6 C)   Ht 5\' 3"  (1.6 m)   Wt 103 lb (46.7 kg)   LMP  (LMP Unknown)   SpO2 (!) 85% Comment: Patient not shob, states she has O2 at home...  BMI 18.25 kg/m      04/19/2023    8:06 AM 01/20/2023    9:34 AM 04/15/2022   10:09 AM  BP/Weight  Systolic BP 118 112 108  Diastolic BP 74 68 72  Wt. (Lbs) 103 99.6 106  BMI 18.25 kg/m2 17.64 kg/m2 18.78 kg/m2    Physical Exam Vitals reviewed.  Constitutional:      Appearance: Normal appearance. She is normal weight.  Neck:     Vascular: No carotid bruit.  Cardiovascular:     Rate and Rhythm: Normal rate and regular rhythm.     Heart sounds: Normal heart sounds.  Pulmonary:     Effort: Pulmonary effort is normal. No respiratory distress.     Breath sounds: Normal breath sounds.  Abdominal:     General: Abdomen is flat. Bowel sounds are normal.     Palpations: Abdomen is soft.     Tenderness: There is no abdominal tenderness.  Neurological:     Mental Status: She is alert and oriented to person, place, and time.  Psychiatric:        Mood and Affect: Mood normal.        Behavior: Behavior normal.     Diabetic Foot Exam - Simple    No data filed      Lab Results  Component Value Date   WBC 6.4 04/15/2022   HGB 13.7 04/15/2022   HCT 40.4 04/15/2022   PLT 221 04/15/2022   GLUCOSE 87 04/15/2022   CHOL 177 04/15/2022   TRIG 57 04/15/2022   HDL 107 04/15/2022   LDLCALC 59 04/15/2022   ALT 25 04/15/2022   AST 30 04/15/2022   NA 141 04/15/2022   K 4.9 04/15/2022   CL 98 04/15/2022   CREATININE 0.78 04/15/2022   BUN 14 04/15/2022   CO2 28 04/15/2022   TSH 1.860 08/26/2021      Assessment & Plan:    Osteoporosis, postmenopausal Assessment & Plan: Await bone density results to make further recommendations.   Depression, major, recurrent, mild (HCC) Assessment & Plan: The current medical regimen is effective;  continue present plan and medications. Continue zoloft 100 mg 2 daily and clonazepam 1 mg twice daily as needed. None needed for several months.    Mixed hyperlipidemia Assessment & Plan: Well controlled.  No changes to medicines. Continue lipitor 10 mg daily.  Continue to work on eating a healthy diet and exercise.  Labs drawn today.    Orders: -     CBC with Differential/Platelet -     Comprehensive metabolic panel -     Lipid panel -     TSH  Mild protein-calorie malnutrition (HCC) Assessment & Plan: Recommend continue three meals per day.  Protein shakes recommended.    Simple chronic bronchitis (HCC) Assessment & Plan: The current medical regimen is effective;  continue present plan and medications. Continue advair.   Orders: -     Albuterol Sulfate HFA; Inhale 2 puffs into the lungs every 6 (six) hours as needed for wheezing or shortness of breath.  Dispense: 24 g; Refill: 3  Chronic respiratory failure with hypoxia (HCC) Assessment & Plan: Continue oxygen 2 L at night.    RLS (restless legs syndrome) Assessment & Plan: Continue mirapex 1 mg one before bed.    Gastroesophageal reflux disease without esophagitis Assessment & Plan: The current medical regimen is  effective;  continue present plan and medications. Continue pepcid 40 mg twice daily.       Meds ordered this encounter  Medications   albuterol (VENTOLIN HFA) 108 (90 Base) MCG/ACT inhaler    Sig: Inhale 2 puffs into the lungs every 6 (six) hours as needed for wheezing or shortness of breath.    Dispense:  24 g    Refill:  3    Orders Placed This Encounter  Procedures   CBC with Differential/Platelet   Comprehensive metabolic panel   Lipid panel   TSH     Follow-up: Return in about 6 months (around 10/17/2023).   I,Katherina A Bramblett,acting as a scribe for Blane Ohara, MD.,have documented all relevant documentation on the behalf of  Blane Ohara, MD,as directed by  Blane Ohara, MD while in the presence of Blane Ohara, MD.   An After Visit Summary was printed and given to the patient.  I attest that I have reviewed this visit and agree with the plan scribed by my staff.   Blane Ohara, MD [facility] [phone]

## 2023-04-19 NOTE — Assessment & Plan Note (Signed)
The current medical regimen is effective;  continue present plan and medications. Continue zoloft 100 mg 2 daily and clonazepam 1 mg twice daily as needed. None needed for several months.

## 2023-04-19 NOTE — Assessment & Plan Note (Signed)
Await bone density results to make further recommendations.

## 2023-04-19 NOTE — Assessment & Plan Note (Signed)
Well controlled.  °No changes to medicines. Continue lipitor 10 mg daily.  °Continue to work on eating a healthy diet and exercise.  °Labs drawn today.  ° °

## 2023-04-19 NOTE — Assessment & Plan Note (Signed)
Continue mirapex 1 mg one before bed.

## 2023-04-19 NOTE — Assessment & Plan Note (Signed)
Recommend continue three meals per day.  Protein shakes recommended.

## 2023-04-19 NOTE — Assessment & Plan Note (Signed)
The current medical regimen is effective;  continue present plan and medications. Continue pepcid 40 mg twice daily.

## 2023-04-20 ENCOUNTER — Other Ambulatory Visit (HOSPITAL_COMMUNITY)
Admission: RE | Admit: 2023-04-20 | Discharge: 2023-04-20 | Disposition: A | Discharge status: Home / Self Care | Payer: Self-pay | Source: Ambulatory Visit | Attending: Medical Genetics | Admitting: Medical Genetics

## 2023-04-20 ENCOUNTER — Encounter: Payer: Self-pay | Admitting: Family Medicine

## 2023-04-20 DIAGNOSIS — Z006 Encounter for examination for normal comparison and control in clinical research program: Secondary | ICD-10-CM | POA: Insufficient documentation

## 2023-04-20 LAB — TSH: TSH: 2.03 u[IU]/mL (ref 0.450–4.500)

## 2023-04-20 LAB — CBC WITH DIFFERENTIAL/PLATELET
Basophils Absolute: 0 10*3/uL (ref 0.0–0.2)
Basos: 1 %
EOS (ABSOLUTE): 0.2 10*3/uL (ref 0.0–0.4)
Eos: 3 %
Hematocrit: 43 % (ref 34.0–46.6)
Hemoglobin: 13.8 g/dL (ref 11.1–15.9)
Immature Grans (Abs): 0 10*3/uL (ref 0.0–0.1)
Immature Granulocytes: 0 %
Lymphocytes Absolute: 2.1 10*3/uL (ref 0.7–3.1)
Lymphs: 38 %
MCH: 30.9 pg (ref 26.6–33.0)
MCHC: 32.1 g/dL (ref 31.5–35.7)
MCV: 96 fL (ref 79–97)
Monocytes Absolute: 0.7 10*3/uL (ref 0.1–0.9)
Monocytes: 12 %
Neutrophils Absolute: 2.7 10*3/uL (ref 1.4–7.0)
Neutrophils: 46 %
Platelets: 250 10*3/uL (ref 150–450)
RBC: 4.47 x10E6/uL (ref 3.77–5.28)
RDW: 12.3 % (ref 11.7–15.4)
WBC: 5.7 10*3/uL (ref 3.4–10.8)

## 2023-04-20 LAB — LIPID PANEL
Chol/HDL Ratio: 1.9 {ratio} (ref 0.0–4.4)
Cholesterol, Total: 196 mg/dL (ref 100–199)
HDL: 105 mg/dL (ref >39)
LDL Chol Calc (NIH): 78 mg/dL (ref 0–99)
Triglycerides: 74 mg/dL (ref 0–149)
VLDL Cholesterol Cal: 13 mg/dL (ref 5–40)

## 2023-04-20 LAB — COMPREHENSIVE METABOLIC PANEL
ALT: 16 [IU]/L (ref 0–32)
AST: 24 [IU]/L (ref 0–40)
Albumin: 4.2 g/dL (ref 3.8–4.8)
Alkaline Phosphatase: 70 [IU]/L (ref 44–121)
BUN/Creatinine Ratio: 14 (ref 12–28)
BUN: 12 mg/dL (ref 8–27)
Bilirubin Total: 0.3 mg/dL (ref 0.0–1.2)
CO2: 25 mmol/L (ref 20–29)
Calcium: 9.1 mg/dL (ref 8.7–10.3)
Chloride: 100 mmol/L (ref 96–106)
Creatinine, Ser: 0.88 mg/dL (ref 0.57–1.00)
Globulin, Total: 2.6 g/dL (ref 1.5–4.5)
Glucose: 98 mg/dL (ref 70–99)
Potassium: 5.2 mmol/L (ref 3.5–5.2)
Sodium: 139 mmol/L (ref 134–144)
Total Protein: 6.8 g/dL (ref 6.0–8.5)
eGFR: 69 mL/min/{1.73_m2} (ref >59)

## 2023-04-26 ENCOUNTER — Other Ambulatory Visit: Payer: Self-pay | Admitting: Family Medicine

## 2023-04-26 DIAGNOSIS — F330 Major depressive disorder, recurrent, mild: Secondary | ICD-10-CM

## 2023-05-04 LAB — GENECONNECT MOLECULAR SCREEN: Genetic Analysis Overall Interpretation: NEGATIVE

## 2023-05-10 ENCOUNTER — Telehealth: Payer: Self-pay

## 2023-05-10 ENCOUNTER — Other Ambulatory Visit: Payer: Self-pay

## 2023-05-10 DIAGNOSIS — Z13820 Encounter for screening for osteoporosis: Secondary | ICD-10-CM

## 2023-05-10 NOTE — Telephone Encounter (Signed)
 Copied from CRM [id number]. Topic: Appointments - Scheduling Inquiry for Clinic >> May 10, 2023 10:45 AM Gaetano Hawthorne wrote: Reason for CRM: Patient needs assistance with scheduling a Bone Scan - she swears that she had one scheduled and was able to see it on MyChart, however, I did not see anything in her appointment history.   Patient also wanted to make the office aware that she has been receiving a number of spam calls where folks will say that they're calling from [facility].

## 2023-05-10 NOTE — Telephone Encounter (Signed)
 done

## 2023-05-10 NOTE — Telephone Encounter (Signed)
 An order is needed for bone scan.

## 2023-05-13 ENCOUNTER — Other Ambulatory Visit: Payer: Self-pay

## 2023-05-13 DIAGNOSIS — M810 Age-related osteoporosis without current pathological fracture: Secondary | ICD-10-CM

## 2023-05-13 MED ORDER — DENOSUMAB 60 MG/ML ~~LOC~~ SOSY
60.0000 mg | PREFILLED_SYRINGE | SUBCUTANEOUS | Status: AC
  Administered 2023-05-31: 60 mg via SUBCUTANEOUS

## 2023-05-17 ENCOUNTER — Other Ambulatory Visit: Payer: Self-pay | Admitting: Physician Assistant

## 2023-05-17 ENCOUNTER — Ambulatory Visit (INDEPENDENT_AMBULATORY_CARE_PROVIDER_SITE_OTHER): Admitting: Physician Assistant

## 2023-05-17 ENCOUNTER — Ambulatory Visit: Payer: Self-pay | Admitting: Family Medicine

## 2023-05-17 ENCOUNTER — Encounter: Payer: Self-pay | Admitting: Physician Assistant

## 2023-05-17 VITALS — BP 100/64 | HR 84 | Temp 97.2°F | Resp 18 | Ht 63.0 in | Wt 104.8 lb

## 2023-05-17 DIAGNOSIS — J069 Acute upper respiratory infection, unspecified: Secondary | ICD-10-CM

## 2023-05-17 DIAGNOSIS — J441 Chronic obstructive pulmonary disease with (acute) exacerbation: Secondary | ICD-10-CM

## 2023-05-17 DIAGNOSIS — J410 Simple chronic bronchitis: Secondary | ICD-10-CM

## 2023-05-17 LAB — POCT FLU A/B STATUS
Influenza A, POC: NEGATIVE
Influenza B, POC: NEGATIVE

## 2023-05-17 LAB — POC COVID19 BINAXNOW: SARS Coronavirus 2 Ag: NEGATIVE

## 2023-05-17 MED ORDER — TRIAMCINOLONE ACETONIDE 40 MG/ML IJ SUSP
60.0000 mg | Freq: Once | INTRAMUSCULAR | Status: AC
  Administered 2023-05-17: 60 mg via INTRAMUSCULAR

## 2023-05-17 MED ORDER — ADVAIR DISKUS 250-50 MCG/ACT IN AEPB: 1.0000 | INHALATION_SPRAY | Freq: Two times a day (BID) | RESPIRATORY_TRACT | 3 refills | Status: DC

## 2023-05-17 MED ORDER — HYDROCODONE BIT-HOMATROP MBR 5-1.5 MG/5ML PO SOLN: 5.0000 mL | Freq: Four times a day (QID) | ORAL | 0 refills | Status: DC | PRN

## 2023-05-17 MED ORDER — PREDNISONE 20 MG PO TABS: ORAL_TABLET | ORAL | 0 refills | Status: DC

## 2023-05-17 MED ORDER — AMOXICILLIN-POT CLAVULANATE 875-125 MG PO TABS: 1.0000 | ORAL_TABLET | Freq: Two times a day (BID) | ORAL | 0 refills | Status: DC

## 2023-05-17 NOTE — Progress Notes (Signed)
 Acute Office Visit  Subjective:    Patient ID: Ellen Holloway, female    DOB: 14-Mar-1951, 73 y.o.   MRN: [id number]  Chief Complaint  Patient presents with   Cough    HPI: Patient is in today for complaints of productive cough and congestion for the past few days - states she has not had a fever but has had mild malaise.  She does use albuterol inhaler but has not been using adviar (requests refill) Denies shortness of breath but has had wheezing/coughing spells She is not using any otc meds for symptoms   Current Outpatient Medications:    albuterol (VENTOLIN HFA) 108 (90 Base) MCG/ACT inhaler, Inhale 2 puffs into the lungs every 6 (six) hours as needed for wheezing or shortness of breath., Disp: 24 g, Rfl: 3   amoxicillin-clavulanate (AUGMENTIN) 875-125 MG tablet, Take 1 tablet by mouth 2 (two) times daily., Disp: 20 tablet, Rfl: 0   atorvastatin (LIPITOR) 10 MG tablet, TAKE 1 TABLET BY MOUTH EVERY DAY, Disp: 90 tablet, Rfl: 1   BIOTIN PO, Take by mouth., Disp: , Rfl:    calcium carbonate (OSCAL) 1500 (600 Ca) MG TABS tablet, Take 600 mg of elemental calcium by mouth daily., Disp: , Rfl:    clonazePAM (KLONOPIN) 1 MG tablet, Take 1 tablet (1 mg total) by mouth daily as needed for anxiety., Disp: 30 tablet, Rfl: 1   COLLAGEN PO, Take by mouth., Disp: , Rfl:    cyclobenzaprine (FLEXERIL) 10 MG tablet, TAKE 1 TABLET BY MOUTH EVERYDAY AT BEDTIME, Disp: 90 tablet, Rfl: 1   famotidine (PEPCID) 40 MG tablet, TAKE 1 TABLET BY MOUTH EVERYDAY AT BEDTIME (Patient taking differently: Take 80 mg by mouth at bedtime.), Disp: 90 tablet, Rfl: 2   feeding supplement (BOOST HIGH PROTEIN) LIQD, Take 1 Container by mouth in the morning and at bedtime., Disp: , Rfl:    furosemide (LASIX) 20 MG tablet, TAKE 1 TABLET (20 MG TOTAL) BY MOUTH DAILY AS NEEDED FOR SWELLING, Disp: 90 tablet, Rfl: 0   HYDROcodone bit-homatropine (HYDROMET) 5-1.5 MG/5ML syrup, Take 5 mLs by mouth every 6 (six) hours as needed.,  Disp: 120 mL, Rfl: 0   oxyCODONE-acetaminophen (PERCOCET) 10-325 MG tablet, Take 1 tablet by mouth every 6 (six) hours., Disp: , Rfl:    OXYGEN, Inhale 3 L into the lungs. 2 L at night., Disp: , Rfl:    pramipexole (MIRAPEX) 1 MG tablet, TAKE 1 TABLET BY MOUTH EVERYDAY AT BEDTIME, Disp: 90 tablet, Rfl: 1   predniSONE (DELTASONE) 20 MG tablet, 1 po tid for 3 days then 1 po bid for 3 days then 1 po qd for 3 days, Disp: 18 tablet, Rfl: 0   promethazine (PHENERGAN) 25 MG tablet, Take 1 tablet (25 mg total) by mouth every 6 (six) hours as needed for nausea or vomiting., Disp: 30 tablet, Rfl: 1   sertraline (ZOLOFT) 100 MG tablet, TAKE 2 TABLETS BY MOUTH EVERY DAY, Disp: 180 tablet, Rfl: 1   spironolactone (ALDACTONE) 25 MG tablet, TAKE 1 TABLET BY MOUTH EVERY DAY, Disp: 90 tablet, Rfl: 1   VITAMIN D-VITAMIN K PO, Take 1 tablet by mouth daily., Disp: , Rfl:    ADVAIR DISKUS 250-50 MCG/ACT AEPB, Inhale 1 puff into the lungs 2 (two) times daily., Disp: 1 each, Rfl: 3  Current Facility-Administered Medications:    denosumab (PROLIA) injection 60 mg, 60 mg, Subcutaneous, Q6 months, [facility], Kirsten, MD, 60 mg at 11/30/22 0941   [START ON 05/31/2023] denosumab (  PROLIA) injection 60 mg, 60 mg, Subcutaneous, Q6 months, [facility], Kirsten, MD   triamcinolone acetonide (KENALOG-40) injection 60 mg, 60 mg, Intramuscular, Once,   Allergies  Allergen Reactions   Sulfamethoxazole Anaphylaxis   Sulfa Antibiotics Hives    ROS CONSTITUTIONAL: Negative for chills, fatigue, fever,  E/N/T: scratchy throat CARDIOVASCULAR: Negative for chest pain, dizziness, palpitations and pedal edema.  RESPIRATORY:see HPI GASTROINTESTINAL: Negative for abdominal pain, acid reflux symptoms, constipation, diarrhea, nausea and vomiting.       Objective:    PHYSICAL EXAM:   BP 100/64 (BP Location: Left Arm, Patient Position: Sitting, Cuff Size: Normal)   Pulse 84   Temp (!) 97.2 F (36.2 C) (Temporal)   Resp 18   Ht 5\' 3"  (1.6 m)    Wt 104 lb 12.8 oz (47.5 kg)   LMP  (LMP Unknown)   SpO2 100%   BMI 18.56 kg/m    GEN: Well nourished, well developed, in no acute distress  HEENT: normal external ears and nose - normal external auditory canals and TMS - - Lips, Teeth and Gums - normal  Oropharynx - normal mucosa, palate, and posterior pharynx  Cardiac: RRR; no murmurs, rubs,  Respiratory:  scattered rhonchi/wheezes  Skin: warm and dry, no rash  Neuro:  Alert and Oriented x 3,  CN II-Xii grossly intact Psych: euthymic mood, appropriate affect and demeanor     Office Visit on 05/17/2023  Component Date Value Ref Range Status   SARS Coronavirus 2 Ag 05/17/2023 Negative  Negative Final   Influenza A, POC 05/17/2023 Negative  Negative Final   Influenza B, POC 05/17/2023 Negative  Negative Final    Assessment & Plan:    Acute upper respiratory infection -     POC COVID-19 BinaxNow -     POCT Flu A & B Status  Simple chronic bronchitis (HCC) -     Advair Diskus; Inhale 1 puff into the lungs 2 (two) times daily.  Dispense: 1 each; Refill: 3  COPD exacerbation (HCC) -     Triamcinolone Acetonide -     predniSONE; 1 po tid for 3 days then 1 po bid for 3 days then 1 po qd for 3 days  Dispense: 18 tablet; Refill: 0 -     Amoxicillin-Pot Clavulanate; Take 1 tablet by mouth 2 (two) times daily.  Dispense: 20 tablet; Refill: 0 -     HYDROcodone Bit-Homatrop MBr; Take 5 mLs by mouth every 6 (six) hours as needed.  Dispense: 120 mL; Refill: 0     Follow-up: Return if symptoms worsen or fail to improve.  An After Visit Summary was printed and given to the patient.  Jettie Pagan [facility] [phone]

## 2023-05-17 NOTE — Telephone Encounter (Signed)
 Appointment scheduled at [facility] on 3/13 check-in at 8am. Pt notified.

## 2023-05-17 NOTE — Telephone Encounter (Signed)
 Chief Complaint: Productive cough Symptoms: Congestion, runny nose, hemoptysis, wheezing, mild difficulty breathing Frequency: Intermittent Pertinent Negatives: Patient denies fever, chest pain Disposition: [] ED /[] Urgent Care (no appt availability in office) / [x] Appointment(In office/virtual)/ []  [facility]/ [] Home Care/ [] Refused Recommended Disposition /[] [facility]/ []  Follow-up with PCP Additional Notes: Pt states she has a sinus infection that began on Saturday. Pt states she has large blobs of green/yellow sputum with blood streaks she is coughing up. Pt scheduled for an appointment this morning. This RN educated pt on home care, new-worsening symptoms, when to call back/seek emergent care. Pt verbalized understanding and agrees to plan.     Copied from CRM [id number]. Topic: Clinical - Red Word Triage >> May 17, 2023  9:52 AM Philippa Chester F wrote: [facility] that prompted transfer to Nurse Triage: Discolored mucous; Green with a little blood  Symptoms:   Coughing Sinus Headache   Patient feels the mucous is making its way into her chest and would like an antibiotic prescription and cough medicine that is safe for her to take. Reason for Disposition  [1] Coughed up blood AND [2] > 1 tablespoon (15 ml)   (Exception: Blood-tinged sputum.)  Answer Assessment - Initial Assessment Questions 1. ONSET: "When did the cough begin?"      Saturday 2. SPUTUM: "Describe the color of your sputum" (none, dry cough; clear, white, yellow, green)     Green, yellow, with blood streaks 4. HEMOPTYSIS: "Are you coughing up any blood?" If so ask: "How much?" (flecks, streaks, tablespoons, etc.)     Yes, streaks 5. DIFFICULTY BREATHING: "Are you having difficulty breathing?" If Yes, ask: "How bad is it?" (e.g., mild, moderate, severe)    - MILD: No SOB at rest, mild SOB with walking, speaks normally in sentences, can lie down, no retractions, pulse < 100.    - MODERATE: SOB at rest,  SOB with minimal exertion and prefers to sit, cannot lie down flat, speaks in phrases, mild retractions, audible wheezing, pulse 100-120.    - SEVERE: Very SOB at rest, speaks in single words, struggling to breathe, sitting hunched forward, retractions, pulse > 120      I get short winded but I do have oxygen, mild 6. FEVER: "Do you have a fever?" If Yes, ask: "What is your temperature, how was it measured, and when did it start?"     Denies, 98 F 7. CARDIAC HISTORY: "Do you have any history of heart disease?" (e.g., heart attack, congestive heart failure)      Denies 8. LUNG HISTORY: "Do you have any history of lung disease?"  (e.g., pulmonary embolus, asthma, emphysema)     COPD 9. OTHER SYMPTOMS: "Do you have any other symptoms?" (e.g., runny nose, wheezing, chest pain)       Wheezing  Protocols used: Cough - Acute Productive-A-AH

## 2023-05-17 NOTE — Telephone Encounter (Signed)
 Phone: [phone]  Please call the patient back with the appointment information.

## 2023-05-27 LAB — HM DEXA SCAN

## 2023-05-31 ENCOUNTER — Ambulatory Visit (INDEPENDENT_AMBULATORY_CARE_PROVIDER_SITE_OTHER): Payer: Medicare Other

## 2023-05-31 DIAGNOSIS — M810 Age-related osteoporosis without current pathological fracture: Secondary | ICD-10-CM

## 2023-05-31 DIAGNOSIS — M8000XA Age-related osteoporosis with current pathological fracture, unspecified site, initial encounter for fracture: Secondary | ICD-10-CM | POA: Diagnosis not present

## 2023-05-31 MED ORDER — DENOSUMAB 60 MG/ML ~~LOC~~ SOSY
60.0000 mg | PREFILLED_SYRINGE | SUBCUTANEOUS | Status: AC
  Administered 2023-12-02: 60 mg via SUBCUTANEOUS

## 2023-05-31 NOTE — Progress Notes (Signed)
   Patient: Ellen Holloway  DOB: 03/10/1951  MRN: [id number]    Visit Date: 05/31/2023    Ellen Holloway presents today for her six month Prolia injection.  1 x 60 mg Single-Dose Prefilled Syringe was given SQ in the left arm.  Patient tolerated the injection well and has no questions.  The next Prolia injection will be due in six months.  Administrations This Visit     denosumab (PROLIA) injection 60 mg     Admin Date 05/31/2023 Action Given Dose 60 mg Route Subcutaneous Documented By Chinita Greenland, CMA              Chinita Greenland, CMA

## 2023-05-31 NOTE — Addendum Note (Signed)
 Addended by: Jacklynn Bue on: 05/31/2023 09:20 AM   Modules accepted: Orders

## 2023-06-07 ENCOUNTER — Encounter: Payer: Self-pay | Admitting: Family Medicine

## 2023-06-09 ENCOUNTER — Encounter: Payer: Self-pay | Admitting: Family Medicine

## 2023-06-26 ENCOUNTER — Other Ambulatory Visit: Payer: Self-pay | Admitting: Family Medicine

## 2023-08-06 ENCOUNTER — Other Ambulatory Visit: Payer: Self-pay

## 2023-08-06 ENCOUNTER — Other Ambulatory Visit: Payer: Self-pay | Admitting: Family Medicine

## 2023-08-06 DIAGNOSIS — E782 Mixed hyperlipidemia: Secondary | ICD-10-CM

## 2023-09-19 ENCOUNTER — Encounter (INDEPENDENT_AMBULATORY_CARE_PROVIDER_SITE_OTHER): Payer: Self-pay

## 2023-09-20 IMAGING — MG MM DIGITAL SCREENING BILAT W/ TOMO AND CAD
6 of 10 series · 6 of 30 positions shown · non-contrast
Comparison: Previous exam(s).

CLINICAL DATA: Screening.

EXAM:
DIGITAL SCREENING BILATERAL MAMMOGRAM WITH TOMOSYNTHESIS AND CAD
TECHNIQUE: Bilateral screening digital craniocaudal and mediolateral oblique
mammograms were obtained. Bilateral screening digital breast
tomosynthesis was performed. The images were evaluated with
computer-aided detection.

[L MLO synth-2D]
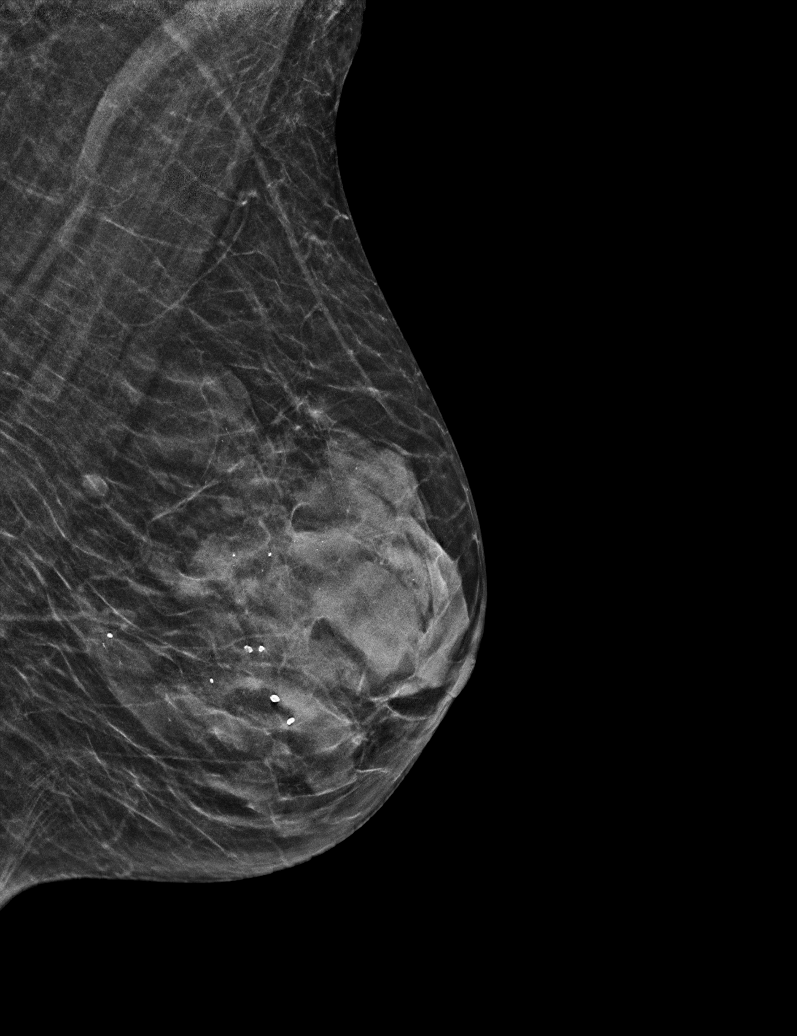

[R MLO synth-2D (1 of 2)]
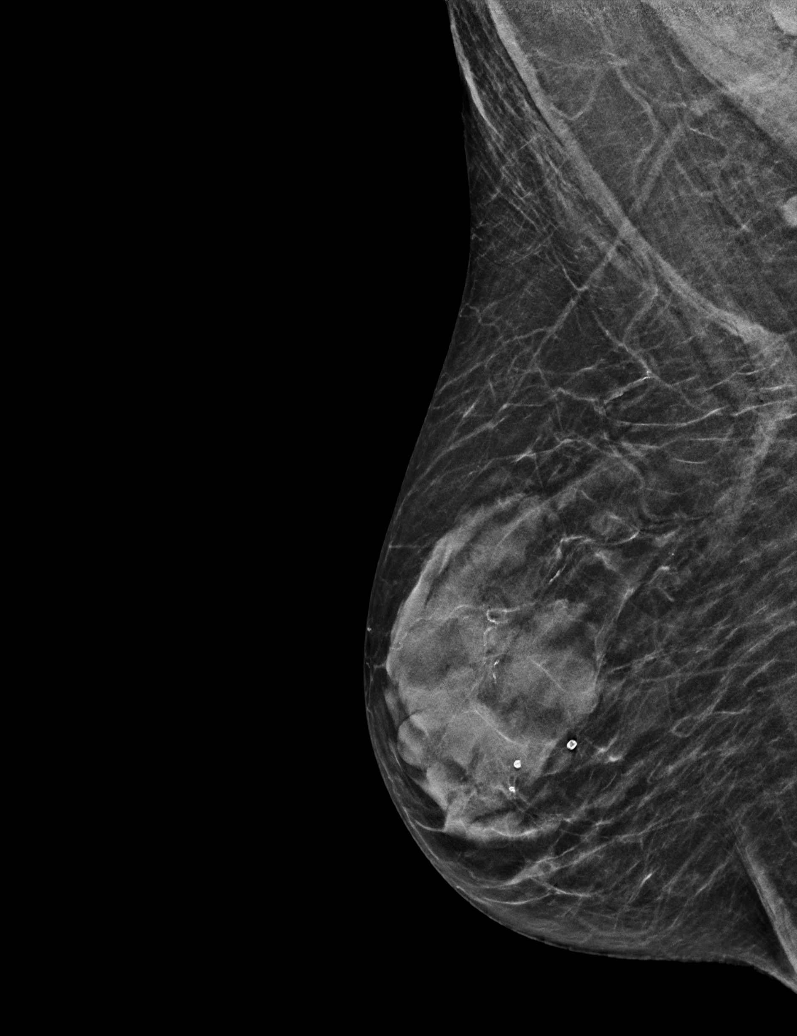

[R CC synth-2D]
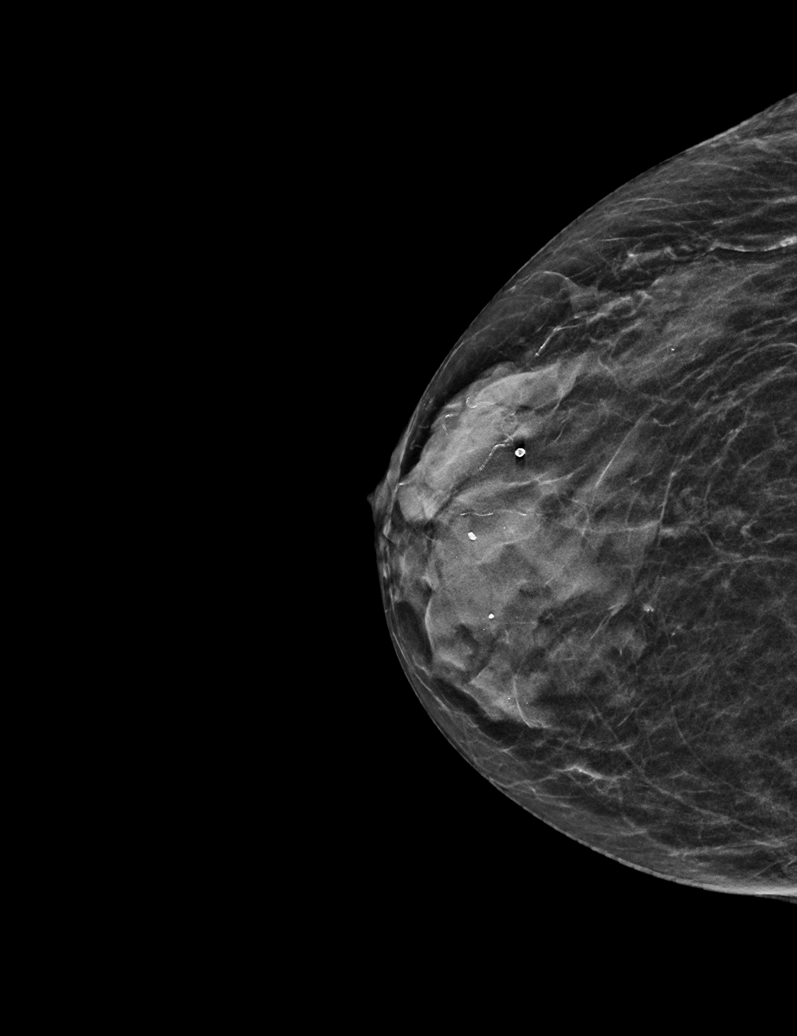

[R MLO synth-2D (2 of 2)]
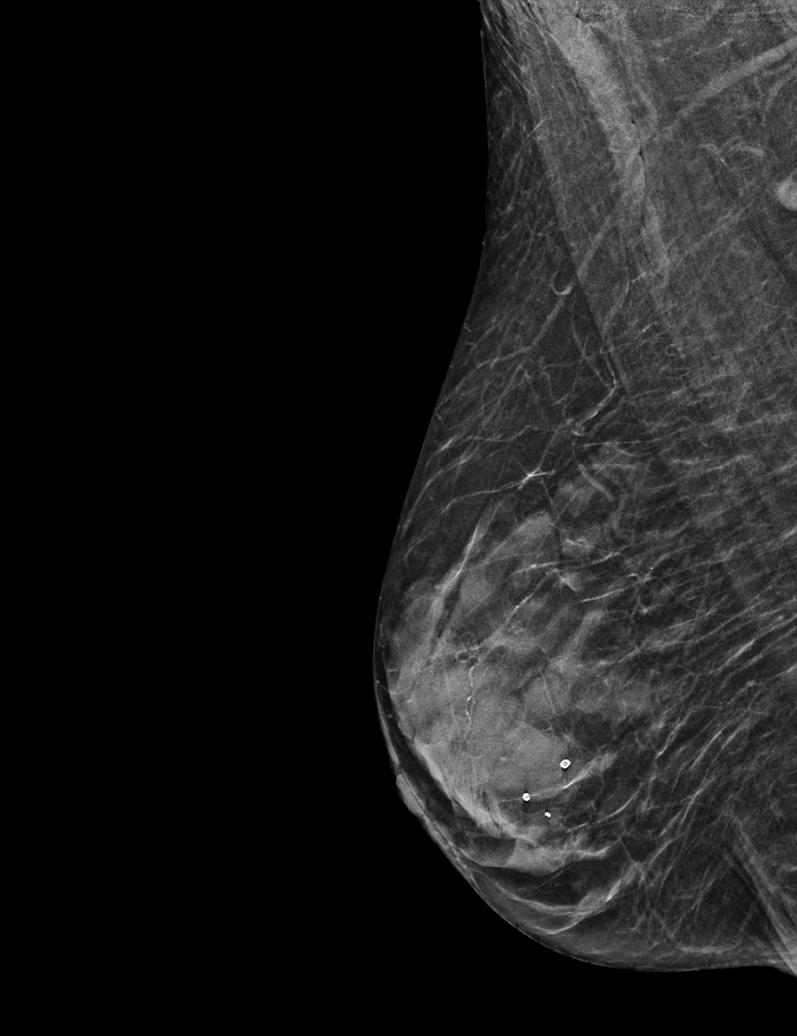

[L CC synth-2D]
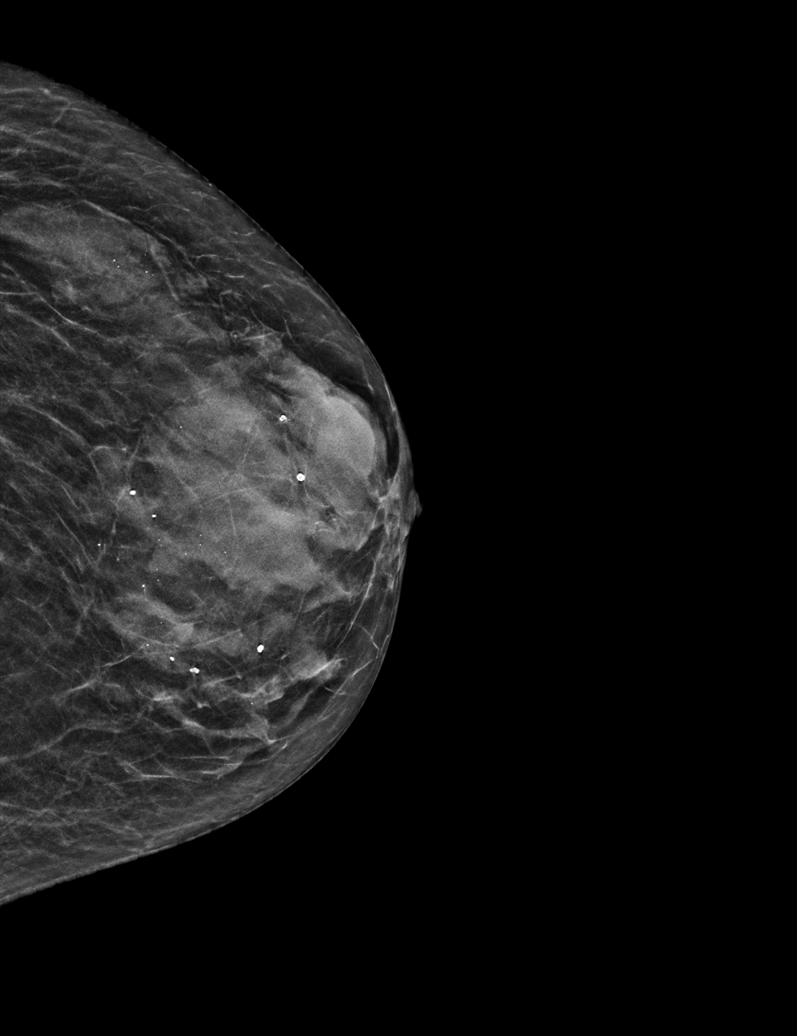

[R CC tomo · tomo slice 18/35.0]
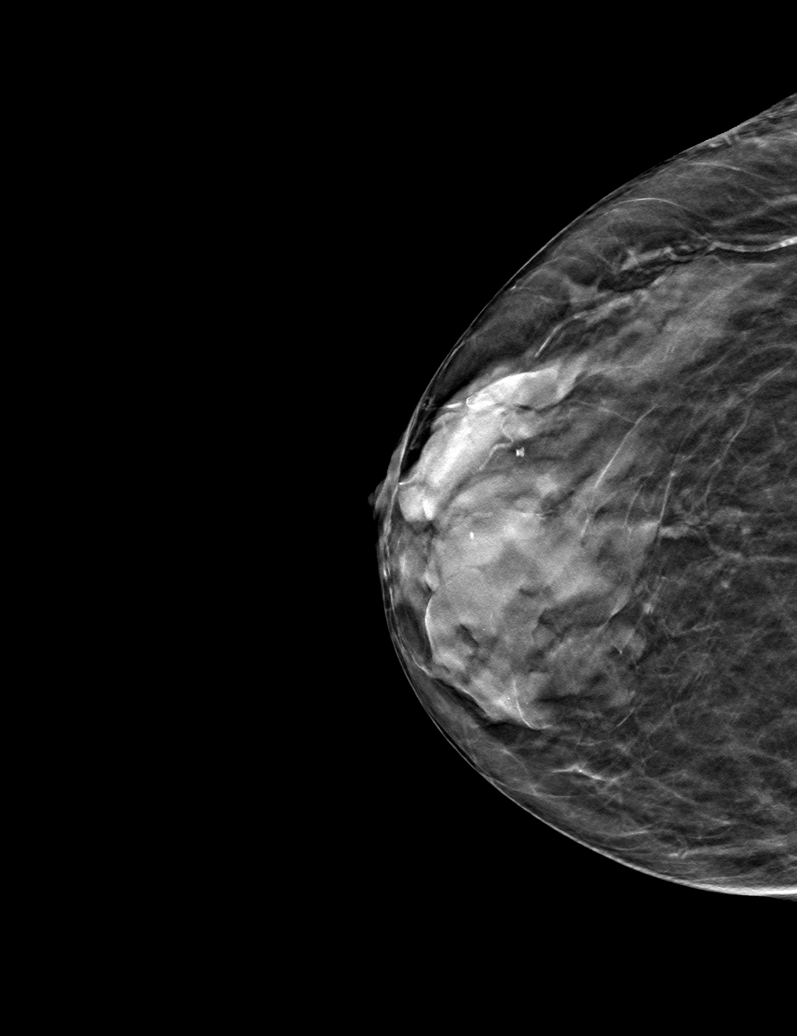

[6 of 30 positions shown; findings below may reference images not displayed]

ACR Breast Density Category d: The breast tissue is extremely dense,
which lowers the sensitivity of mammography
FINDINGS: There are no findings suspicious for malignancy.
IMPRESSION: No mammographic evidence of malignancy. A result letter of this
screening mammogram will be mailed directly to the patient.

RECOMMENDATION:
Screening mammogram in one year. (Code:TA-V-WV9)

BI-RADS CATEGORY  1: Negative.

## 2023-09-25 ENCOUNTER — Other Ambulatory Visit: Payer: Self-pay | Admitting: Family Medicine

## 2023-10-04 ENCOUNTER — Other Ambulatory Visit: Payer: Self-pay | Admitting: Family Medicine

## 2023-10-19 ENCOUNTER — Ambulatory Visit (INDEPENDENT_AMBULATORY_CARE_PROVIDER_SITE_OTHER): Payer: Medicare Other | Admitting: Family Medicine

## 2023-10-19 VITALS — BP 128/72 | HR 93 | Temp 98.2°F | Ht 63.0 in | Wt 98.0 lb

## 2023-10-19 DIAGNOSIS — F330 Major depressive disorder, recurrent, mild: Secondary | ICD-10-CM | POA: Diagnosis not present

## 2023-10-19 DIAGNOSIS — M810 Age-related osteoporosis without current pathological fracture: Secondary | ICD-10-CM | POA: Diagnosis not present

## 2023-10-19 DIAGNOSIS — F1120 Opioid dependence, uncomplicated: Secondary | ICD-10-CM | POA: Insufficient documentation

## 2023-10-19 DIAGNOSIS — J9611 Chronic respiratory failure with hypoxia: Secondary | ICD-10-CM

## 2023-10-19 DIAGNOSIS — E441 Mild protein-calorie malnutrition: Secondary | ICD-10-CM

## 2023-10-19 DIAGNOSIS — G2581 Restless legs syndrome: Secondary | ICD-10-CM | POA: Diagnosis not present

## 2023-10-19 DIAGNOSIS — J410 Simple chronic bronchitis: Secondary | ICD-10-CM | POA: Diagnosis not present

## 2023-10-19 DIAGNOSIS — Z1231 Encounter for screening mammogram for malignant neoplasm of breast: Secondary | ICD-10-CM

## 2023-10-19 DIAGNOSIS — R252 Cramp and spasm: Secondary | ICD-10-CM

## 2023-10-19 DIAGNOSIS — E782 Mixed hyperlipidemia: Secondary | ICD-10-CM

## 2023-10-19 MED ORDER — CLONAZEPAM 1 MG PO TABS: 1.0000 mg | ORAL_TABLET | Freq: Every day | ORAL | 1 refills | Status: AC | PRN

## 2023-10-19 MED ORDER — ALBUTEROL SULFATE HFA 108 (90 BASE) MCG/ACT IN AERS: 2.0000 | INHALATION_SPRAY | Freq: Four times a day (QID) | RESPIRATORY_TRACT | 3 refills | Status: AC | PRN

## 2023-10-19 NOTE — Assessment & Plan Note (Signed)
 The current medical regimen is effective;  continue present plan and medications. Continue advair . Albuterol  HFA 2 puffs four times a day as needed

## 2023-10-19 NOTE — Patient Instructions (Addendum)
 VISIT SUMMARY:  During your visit, we discussed your weight loss, respiratory symptoms, back pain, leg cramps, and overall health management. We have made some adjustments to your medications and provided new recommendations to help manage your symptoms.  YOUR PLAN:  CHRONIC OBSTRUCTIVE PULMONARY DISEASE (COPD): Your chronic cough and sputum production have slightly worsened. -We provided a sample of the Breztri inhaler to see if it helps improve your symptoms. Stop advair . -Please evaluate the effectiveness of the Breztri inhaler. -A regular CT scan has been ordered for weight loss, copd, chronic respiratory failure.   UNINTENTIONAL WEIGHT LOSS: You have experienced a 6-pound weight loss and have a history of anorexia. -Mirtazapine has been prescribed to help with sleep and appetite issues.  RESTLESS LEGS SYNDROME WITH LEG CRAMPS: You are experiencing frequent leg cramps despite taking Mirapex . -Continue taking Mirapex  1 mg daily.  CHRONIC THORACIC BACK PAIN: You have chronic back pain managed by the pain clinic. -Continue with your current pain management plan from the pain clinic.  OSTEOPOROSIS: You are managing osteoporosis with Prolia  injections and supplements. -Continue Prolia  injections every six months. Due in a couple of weeks. -Continue taking calcium , vitamin D , and vitamin K supplements.  VARICOSE VEINS OF LOWER EXTREMITIES: You have worsening varicose veins with cramps. -We recommend using compression stockings. -You can visit [employer] or [employer] store to get the stockings.  HYPERLIPIDEMIA: You are managing high cholesterol with atorvastatin . -Continue taking atorvastatin  as prescribed.  GASTROESOPHAGEAL REFLUX DISEASE (GERD): Your GERD symptoms are well-managed with famotidine . -Continue taking famotidine  40 mg twice daily.  CHRONIC ANXIETY: You are managing anxiety with Zoloft  and occasional clonazepam  use. -Continue taking Zoloft  100 mg twice daily. -Use  clonazepam  as needed.

## 2023-10-19 NOTE — Progress Notes (Signed)
 f  Subjective:  Patient ID: Ellen Holloway, female    DOB: [birth year]  Age: 73 y.o. MRN: [id number]  Chief Complaint  Patient presents with   Medical Management of Chronic Issues    HPI: Discussed the use of AI scribe software for clinical note transcription with the patient, who gave verbal consent to proceed.  History of Present Illness   Ellen Holloway is a 73 year old female with COPD and osteoporosis who presents with weight loss and respiratory symptoms.  Unintentional weight loss - Significant weight loss, now at lowest weight since age 13 - No intentional dieting - Consuming regular meals including eggs, meat, toast, sandwiches, Boost, Coke, meats, vegetables, potatoes or rice, and occasional beer - History of anorexia in adolescence - Concerned about ongoing weight loss despite adequate caloric intake  Respiratory symptoms - Rhinorrhea leading to productive cough with thick sputum - Shortness of breath slightly worse than baseline - No chest pain - No fever, chills, sweats, earaches, sore throat, or nasal congestion - Uses Advair  and Albuterol  for COPD - Unable to afford Trelegy inhalers due to financial constraints - Uses oxygen  at 3 L at night and occasionally during the day for dyspnea - Former smoker with 30-year history, quit within the last 15 years  Thoracic back pain - Back pain located between the shoulder blades - Associated with vertebral fracture - Exercises for lower back pain are ineffective for current symptoms - Takes pain medication from pain clinic - Uses cyclobenzaprine , but it does not relieve leg cramps  Leg cramps and restless legs - Leg cramps occur daily, especially in the early morning, for the past six days - Relief with walking - Tried mustard, pickle juice, tonic water, and [place] cramp remedy without benefit - Takes Mirapex  for restless legs - Uses Lasix  as needed for swelling, but not regularly - Cyclobenzaprine  does not  alleviate leg cramps  Mood and energy - Takes Zoloft  100 mg twice daily for anxiety and depression - Uses clonazepam  as needed, but rarely - Mood is good - Energy levels are high - No depression  Gastrointestinal and genitourinary symptoms - No abdominal pain - No bladder symptoms - No nausea except occasional episodes - Good bowel movements - No acid reflux symptoms while on famotidine  40 mg twice daily  Headache and neurological symptoms - Occasional headaches - No dizziness  Musculoskeletal and skin symptoms - No joint pain - No muscle pain - No skin problems  Osteoporosis management - Takes calcium , vitamin D , and vitamin K - Receives Prolia  injections every six months         10/19/2023    7:47 AM 04/19/2023    8:04 AM 01/20/2023    9:45 AM 04/15/2022   10:05 AM 12/10/2021   10:44 AM  Depression screen PHQ 2/9  Decreased Interest 0 0 0 0 1  Down, Depressed, Hopeless 0 0 0 0 1  PHQ - 2 Score 0 0 0 0 2  Altered sleeping 0 1  1 1   Tired, decreased energy 1 1  0 1  Change in appetite 3 2  0 3  Feeling bad or failure about yourself  0 0  0 0  Trouble concentrating 0 0  0 0  Moving slowly or fidgety/restless 0 0  0 0  Suicidal thoughts 0 0  0 0  PHQ-9 Score 4 4  1 7   Difficult doing work/chores Not difficult at all Not difficult at all  Not difficult at all  Not difficult at all        01/20/2023    8:58 AM  Fall Risk   Falls in the past year? 1  Number falls in past yr: 0  Injury with Fall? 1  Risk for fall due to : No Fall Risks  Follow up Falls evaluation completed;Education provided    Patient Care Team: Sherre Clapper, MD as PCP - General (Internal Medicine) Misenheimer, Evalene, MD as Consulting Physician (Gastroenterology) Chodri, Tanvir, MD as Referring Physician (Pulmonary Disease) Maurilio Camellia PARAS, MD as Consulting Physician (Allergy and Immunology) Rudy Adine HERO, PA-C (Orthopedic Surgery)   Review of Systems  Constitutional:  Negative for chills,  fatigue and fever.  HENT:  Positive for postnasal drip. Negative for congestion, ear pain and sore throat.   Respiratory:  Positive for cough and shortness of breath.   Cardiovascular:  Negative for chest pain.  Gastrointestinal:  Positive for nausea. Negative for abdominal pain, constipation, diarrhea and vomiting.  Genitourinary:  Negative for dysuria and urgency.  Musculoskeletal:  Positive for back pain (thoracic between shoulder blades). Negative for arthralgias and myalgias.  Skin:  Negative for rash.  Neurological:  Positive for headaches (occasionally.). Negative for dizziness.  Psychiatric/Behavioral:  Negative for dysphoric mood. The patient is not nervous/anxious.     Current Outpatient Medications on File Prior to Visit  Medication Sig Dispense Refill   atorvastatin  (LIPITOR) 10 MG tablet TAKE 1 TABLET BY MOUTH EVERY DAY 90 tablet 1   BIOTIN PO Take by mouth.     calcium  carbonate (OSCAL) 1500 (600 Ca) MG TABS tablet Take 600 mg of elemental calcium  by mouth daily.     COLLAGEN PO Take by mouth.     cyclobenzaprine  (FLEXERIL ) 10 MG tablet TAKE 1 TABLET BY MOUTH EVERYDAY AT BEDTIME 90 tablet 1   famotidine  (PEPCID ) 40 MG tablet TAKE 1 TABLET BY MOUTH EVERYDAY AT BEDTIME (Patient taking differently: Take 80 mg by mouth at bedtime.) 90 tablet 2   feeding supplement (BOOST HIGH PROTEIN) LIQD Take 1 Container by mouth in the morning and at bedtime.     fluticasone-salmeterol (ADVAIR  HFA) 45-21 MCG/ACT inhaler Inhale 2 puffs into the lungs 2 (two) times daily. 1 each 3   furosemide  (LASIX ) 20 MG tablet TAKE 1 TABLET (20 MG TOTAL) BY MOUTH DAILY AS NEEDED FOR SWELLING 90 tablet 0   oxyCODONE-acetaminophen (PERCOCET) 10-325 MG tablet Take 1 tablet by mouth every 6 (six) hours.     OXYGEN  Inhale 3 L into the lungs. 2 L at night.     pramipexole  (MIRAPEX ) 1 MG tablet TAKE 1 TABLET BY MOUTH EVERYDAY AT BEDTIME 90 tablet 1   promethazine  (PHENERGAN ) 25 MG tablet Take 1 tablet (25 mg total)  by mouth every 6 (six) hours as needed for nausea or vomiting. 30 tablet 1   sertraline  (ZOLOFT ) 100 MG tablet TAKE 2 TABLETS BY MOUTH EVERY DAY 180 tablet 1   spironolactone  (ALDACTONE ) 25 MG tablet TAKE 1 TABLET BY MOUTH EVERY DAY 90 tablet 1   VITAMIN D -VITAMIN K PO Take 1 tablet by mouth daily.     Current Facility-Administered Medications on File Prior to Visit  Medication Dose Route Frequency Provider Last Rate Last Admin   denosumab  (PROLIA ) injection 60 mg  60 mg Subcutaneous Q6 months Darnell Jeschke, MD   60 mg at 11/30/22 0941   [START ON 11/27/2023] denosumab  (PROLIA ) injection 60 mg  60 mg Subcutaneous Q6 months Beautifull Cisar, Clapper, MD       Past  Medical History:  Diagnosis Date   Acquired absence of both cervix and uterus    Age-related osteoporosis with current pathological fracture, unspecified site, initial encounter for fracture    Asthma    Barrett's esophagus with low grade dysplasia    Chronic obstructive pulmonary disease (COPD) (HCC)    Major depressive disorder, recurrent severe without psychotic features (HCC)    Mixed hyperlipidemia    Obstructive sleep apnea (adult) (pediatric)    Other obesity due to excess calories    Other pulmonary embolism without acute cor pulmonale (HCC)    Other specified anxiety disorders    Sleep related leg cramps    Telogen effluvium    Urticaria    Past Surgical History:  Procedure Laterality Date   ABDOMINAL HYSTERECTOMY  1980   partial   BREAST BIOPSY Left 02/12/2022   US  LT BREAST BX W LOC DEV 1ST LESION IMG BX SPEC US  GUIDE 02/12/2022 [facility]   right wrist 1985      Family History  Problem Relation Age of Onset   Obesity Mother    Heart attack Father    Allergic rhinitis Sister    Thyroid  cancer Daughter    Asthma Brother    Renal Disease Brother    Breast cancer Neg Hx    Social History   Socioeconomic History   Marital status: Married    Spouse name: Francis   Number of children: 2   Years of education:  Not on file   Highest education level: Bachelor's degree (e.g., BA, AB, BS)  Occupational History   Not on file  Tobacco Use   Smoking status: Former    Current packs/day: 0.00    Average packs/day: 0.5 packs/day for 55.0 years (27.5 ttl pk-yrs)    Types: E-cigarettes, Cigarettes    Start date: 1964    Quit date: 2019    Years since quitting: 6.5   Smokeless tobacco: Never   Tobacco comments:    Patient used to smoke up to 2 PPD.  Substance and Sexual Activity   Alcohol use: Yes    Alcohol/week: 1.0 standard drink of alcohol    Types: 1 Cans of beer per week    Comment: occasional beer   Drug use: Never   Sexual activity: Not on file  Other Topics Concern   Not on file  Social History Narrative   Daughter - lives in [place], [place] - lives out of state - both are very successful.   Social Drivers of Corporate investment banker Strain: Low Risk  (10/19/2023)   Overall Financial Resource Strain (CARDIA)    Difficulty of Paying Living Expenses: Not hard at all  Food Insecurity: No Food Insecurity (10/19/2023)   Hunger Vital Sign    Worried About Running Out of Food in the Last Year: Never true    Ran Out of Food in the Last Year: Never true  Transportation Needs: No Transportation Needs (10/19/2023)   PRAPARE - Administrator, Civil Service (Medical): No    Lack of Transportation (Non-Medical): No  Physical Activity: Sufficiently Active (10/19/2023)   Exercise Vital Sign    Days of Exercise per Week: 6 days    Minutes of Exercise per Session: 60 min  Stress: Stress Concern Present (10/19/2023)   [employer] of Occupational Health - Occupational Stress Questionnaire    Feeling of Stress: To some extent  Social Connections: Moderately Isolated (10/19/2023)   Social Connection and Isolation Panel  Frequency of Communication with Friends and Family: More than three times a week    Frequency of Social Gatherings with Friends and Family: Twice a week    Attends  Religious Services: Never    Diplomatic Services operational officer: No    Attends Engineer, structural: Not on file    Marital Status: Married    Objective:  BP 128/72   Pulse 93   Temp 98.2 F (36.8 C)   Ht 5' 3 (1.6 m)   Wt 98 lb (44.5 kg)   LMP  (LMP Unknown)   SpO2 93%   BMI 17.36 kg/m      10/19/2023    7:43 AM 05/17/2023   11:31 AM 04/19/2023    8:06 AM  BP/Weight  Systolic BP 128 100 118  Diastolic BP 72 64 74  Wt. (Lbs) 98 104.8 103  BMI 17.36 kg/m2 18.56 kg/m2 18.25 kg/m2    Physical Exam Vitals reviewed.  Constitutional:      Appearance: Normal appearance.     Comments: thin  Neck:     Vascular: No carotid bruit.  Cardiovascular:     Rate and Rhythm: Normal rate and regular rhythm.     Pulses: Normal pulses.     Heart sounds: Normal heart sounds.  Pulmonary:     Effort: Pulmonary effort is normal. No respiratory distress.     Breath sounds: Wheezing (lower lobes BL.) present.  Abdominal:     General: Abdomen is flat. Bowel sounds are normal.     Palpations: Abdomen is soft.     Tenderness: There is no abdominal tenderness.  Lymphadenopathy:     Cervical: No cervical adenopathy.  Neurological:     Mental Status: She is alert and oriented to person, place, and time.  Psychiatric:        Mood and Affect: Mood normal.        Behavior: Behavior normal.         Lab Results  Component Value Date   WBC 5.7 04/19/2023   HGB 13.8 04/19/2023   HCT 43.0 04/19/2023   PLT 250 04/19/2023   GLUCOSE 98 04/19/2023   CHOL 196 04/19/2023   TRIG 74 04/19/2023   HDL 105 04/19/2023   LDLCALC 78 04/19/2023   ALT 16 04/19/2023   AST 24 04/19/2023   NA 139 04/19/2023   K 5.2 04/19/2023   CL 100 04/19/2023   CREATININE 0.88 04/19/2023   BUN 12 04/19/2023   CO2 25 04/19/2023   TSH 2.030 04/19/2023      Assessment & Plan:  Osteoporosis, postmenopausal Assessment & Plan: On prolia  and calcium  with D 2 daily.    Simple chronic bronchitis  (HCC) Assessment & Plan: The current medical regimen is effective;  continue present plan and medications. Continue advair . Albuterol  HFA 2 puffs four times a day as needed   Orders: -     Albuterol  Sulfate HFA; Inhale 2 puffs into the lungs every 6 (six) hours as needed for wheezing or shortness of breath.  Dispense: 24 g; Refill: 3 -     clonazePAM ; Take 1 tablet (1 mg total) by mouth daily as needed for anxiety.  Dispense: 30 tablet; Refill: 1  Depression, major, recurrent, mild (HCC) Assessment & Plan: The current medical regimen is effective;  continue present plan and medications. Continue zoloft  100 mg 2 daily and clonazepam  1 mg daily as needed. None needed for several months.   Orders: -     clonazePAM ;  Take 1 tablet (1 mg total) by mouth daily as needed for anxiety.  Dispense: 30 tablet; Refill: 1  RLS (restless legs syndrome) Assessment & Plan: Pramipexole  1 mg before bed.    Chronic respiratory failure with hypoxia (HCC) -     CT CHEST WO CONTRAST; Future  Muscle cramps Assessment & Plan: Check labs.  Flexeril  before bed.  Hydrate   Mild protein-calorie malnutrition [facility]) Assessment & Plan: Check labs.  Continue to eat 3 meals per day and boost.  Checking ct scan of lungs and ordered mammogram.   Orders: -     B12 and Folate Panel -     Methylmalonic acid, serum  Mixed hyperlipidemia Assessment & Plan: Well controlled.  No changes to medicines. Continue lipitor 10 mg daily.  Continue to work on eating a healthy diet and exercise.  Labs drawn today.    Orders: -     TSH -     Lipid panel -     Comprehensive metabolic panel with GFR -     CBC with Differential/Platelet  Encounter for screening mammogram for malignant neoplasm of breast -     3D Screening Mammogram, Left and Right; Future  Uncomplicated opioid dependence (HCC) Assessment & Plan: Management per specialist.            Meds ordered this encounter  Medications   albuterol   (VENTOLIN  HFA) 108 (90 Base) MCG/ACT inhaler    Sig: Inhale 2 puffs into the lungs every 6 (six) hours as needed for wheezing or shortness of breath.    Dispense:  24 g    Refill:  3   clonazePAM  (KLONOPIN ) 1 MG tablet    Sig: Take 1 tablet (1 mg total) by mouth daily as needed for anxiety.    Dispense:  30 tablet    Refill:  1    This request is for a new prescription for a controlled substance as required by Federal/State law.    Orders Placed This Encounter  Procedures   CT Chest Wo Contrast   MM 3D SCREENING MAMMOGRAM BILATERAL BREAST   TSH   Lipid panel   Comprehensive metabolic panel   CBC with Differential/Platelet   B12 and Folate Panel   Methylmalonic acid, serum     Follow-up: Return in about 3 months (around 01/19/2024).   I,Katherina A Bramblett,acting as a scribe for Abigail Free, MD.,have documented all relevant documentation on the behalf of Abigail Free, MD,as directed by  Abigail Free, MD while in the presence of Abigail Free, MD.   An After Visit Summary was printed and given to the patient.  Abigail Free, MD [facility] [phone]

## 2023-10-19 NOTE — Assessment & Plan Note (Signed)
 The current medical regimen is effective;  continue present plan and medications. Continue zoloft  100 mg 2 daily and clonazepam  1 mg daily as needed. None needed for several months.

## 2023-10-19 NOTE — Assessment & Plan Note (Signed)
 Management per specialist.

## 2023-10-19 NOTE — Assessment & Plan Note (Signed)
 Check labs.  Continue to eat 3 meals per day and boost.  Checking ct scan of lungs and ordered mammogram.

## 2023-10-19 NOTE — Assessment & Plan Note (Signed)
 Pramipexole  1 mg before bed.

## 2023-10-19 NOTE — Assessment & Plan Note (Signed)
 On prolia  and calcium  with D 2 daily.

## 2023-10-19 NOTE — Assessment & Plan Note (Signed)
Well controlled.  °No changes to medicines. Continue lipitor 10 mg daily.  °Continue to work on eating a healthy diet and exercise.  °Labs drawn today.  ° °

## 2023-10-19 NOTE — Assessment & Plan Note (Signed)
 Check labs.  Flexeril  before bed.  Hydrate

## 2023-10-24 ENCOUNTER — Ambulatory Visit: Payer: Self-pay | Admitting: Family Medicine

## 2023-10-24 LAB — COMPREHENSIVE METABOLIC PANEL WITH GFR
ALT: 20 IU/L (ref 0–32)
AST: 26 IU/L (ref 0–40)
Albumin: 4.5 g/dL (ref 3.8–4.8)
Alkaline Phosphatase: 78 IU/L (ref 44–121)
BUN/Creatinine Ratio: 13 (ref 12–28)
BUN: 14 mg/dL (ref 8–27)
Bilirubin Total: 0.3 mg/dL (ref 0.0–1.2)
CO2: 25 mmol/L (ref 20–29)
Calcium: 9.4 mg/dL (ref 8.7–10.3)
Chloride: 100 mmol/L (ref 96–106)
Creatinine, Ser: 1.09 mg/dL — ABNORMAL HIGH (ref 0.57–1.00)
Globulin, Total: 2.4 g/dL (ref 1.5–4.5)
Glucose: 91 mg/dL (ref 70–99)
Potassium: 4.2 mmol/L (ref 3.5–5.2)
Sodium: 140 mmol/L (ref 134–144)
Total Protein: 6.9 g/dL (ref 6.0–8.5)
eGFR: 53 mL/min/1.73 — ABNORMAL LOW (ref >59)

## 2023-10-24 LAB — LIPID PANEL
Chol/HDL Ratio: 1.8 ratio (ref 0.0–4.4)
Cholesterol, Total: 180 mg/dL (ref 100–199)
HDL: 102 mg/dL (ref >39)
LDL Chol Calc (NIH): 67 mg/dL (ref 0–99)
Triglycerides: 56 mg/dL (ref 0–149)
VLDL Cholesterol Cal: 11 mg/dL (ref 5–40)

## 2023-10-24 LAB — CBC WITH DIFFERENTIAL/PLATELET
Basophils Absolute: 0 x10E3/uL (ref 0.0–0.2)
Basos: 1 %
EOS (ABSOLUTE): 0.1 x10E3/uL (ref 0.0–0.4)
Eos: 1 %
Hematocrit: 45.4 % (ref 34.0–46.6)
Hemoglobin: 14.7 g/dL (ref 11.1–15.9)
Immature Grans (Abs): 0 x10E3/uL (ref 0.0–0.1)
Immature Granulocytes: 0 %
Lymphocytes Absolute: 2 x10E3/uL (ref 0.7–3.1)
Lymphs: 30 %
MCH: 31.5 pg (ref 26.6–33.0)
MCHC: 32.4 g/dL (ref 31.5–35.7)
MCV: 97 fL (ref 79–97)
Monocytes Absolute: 0.6 x10E3/uL (ref 0.1–0.9)
Monocytes: 9 %
Neutrophils Absolute: 3.9 x10E3/uL (ref 1.4–7.0)
Neutrophils: 59 %
Platelets: 212 x10E3/uL (ref 150–450)
RBC: 4.66 x10E6/uL (ref 3.77–5.28)
RDW: 12.3 % (ref 11.7–15.4)
WBC: 6.6 x10E3/uL (ref 3.4–10.8)

## 2023-10-24 LAB — TSH: TSH: 0.94 u[IU]/mL (ref 0.450–4.500)

## 2023-10-24 LAB — METHYLMALONIC ACID, SERUM: Methylmalonic Acid: 386 nmol/L — ABNORMAL HIGH (ref 0–378)

## 2023-10-24 LAB — B12 AND FOLATE PANEL
Folate: 10.7 ng/mL (ref >3.0)
Vitamin B-12: 290 pg/mL (ref 232–1245)

## 2023-11-12 ENCOUNTER — Ambulatory Visit (INDEPENDENT_AMBULATORY_CARE_PROVIDER_SITE_OTHER)
Admission: RE | Admit: 2023-11-12 | Discharge: 2023-11-12 | Disposition: A | Discharge status: Home / Self Care | Source: Ambulatory Visit | Attending: Family Medicine | Admitting: Family Medicine

## 2023-11-12 DIAGNOSIS — J9611 Chronic respiratory failure with hypoxia: Secondary | ICD-10-CM | POA: Diagnosis not present

## 2023-11-16 ENCOUNTER — Telehealth: Payer: Self-pay | Admitting: Family Medicine

## 2023-11-16 NOTE — Telephone Encounter (Signed)
 [facility] Neurological Screening

## 2023-11-17 ENCOUNTER — Other Ambulatory Visit: Payer: Self-pay | Admitting: Family Medicine

## 2023-11-17 DIAGNOSIS — F321 Major depressive disorder, single episode, moderate: Secondary | ICD-10-CM

## 2023-11-22 NOTE — Telephone Encounter (Signed)
 Ellen Holloway with [employer] called in to make sure the request for a Neurological Screening was received on September 2nd. After checking I spoke with Ellen Holloway and she said yes it was received and is in the providers box waiting to be signed off on. I relayed this information to Ellen Holloway.

## 2023-11-25 ENCOUNTER — Ambulatory Visit: Admitting: Family Medicine

## 2023-11-25 ENCOUNTER — Ambulatory Visit: Payer: Self-pay

## 2023-11-25 VITALS — BP 118/60 | HR 72 | Temp 98.3°F | Ht 63.0 in | Wt 99.0 lb

## 2023-11-25 DIAGNOSIS — J441 Chronic obstructive pulmonary disease with (acute) exacerbation: Secondary | ICD-10-CM

## 2023-11-25 DIAGNOSIS — R051 Acute cough: Secondary | ICD-10-CM | POA: Diagnosis not present

## 2023-11-25 DIAGNOSIS — J189 Pneumonia, unspecified organism: Secondary | ICD-10-CM | POA: Diagnosis not present

## 2023-11-25 LAB — POCT RAPID STREP A (OFFICE): Rapid Strep A Screen: NEGATIVE

## 2023-11-25 LAB — POCT INFLUENZA A/B
Influenza A, POC: NEGATIVE
Influenza B, POC: NEGATIVE

## 2023-11-25 LAB — POC COVID19 BINAXNOW: SARS Coronavirus 2 Ag: NEGATIVE

## 2023-11-25 MED ORDER — AZITHROMYCIN 250 MG PO TABS: ORAL_TABLET | ORAL | 0 refills | Status: AC

## 2023-11-25 MED ORDER — CEFTRIAXONE SODIUM 1 G IJ SOLR
1.0000 g | Freq: Once | INTRAMUSCULAR | Status: AC
  Administered 2023-11-25: 1 g via INTRAMUSCULAR

## 2023-11-25 MED ORDER — PREDNISONE 20 MG PO TABS: ORAL_TABLET | ORAL | 0 refills | Status: AC

## 2023-11-25 MED ORDER — TRIAMCINOLONE ACETONIDE 40 MG/ML IJ SUSP
80.0000 mg | Freq: Once | INTRAMUSCULAR | Status: AC
  Administered 2023-11-25: 80 mg via INTRAMUSCULAR

## 2023-11-25 MED ORDER — HYDROCOD POLI-CHLORPHE POLI ER 10-8 MG/5ML PO SUER: 5.0000 mL | Freq: Two times a day (BID) | ORAL | 0 refills | Status: DC | PRN

## 2023-11-25 NOTE — Telephone Encounter (Signed)
 Patient is being seen now.

## 2023-11-25 NOTE — Telephone Encounter (Signed)
 FYI Only or Action Required?: FYI only for provider.  Patient was last seen in primary care on 10/19/2023 by Sherre Clapper, MD.  Called Nurse Triage reporting Cough.  Symptoms began a week ago.  Interventions attempted: Nothing.  Symptoms are: gradually worsening.  Triage Disposition: See PCP When Office is Open (Within 3 Days)  Patient/caregiver understands and will follow disposition?: Yes    Copied from CRM #[id number]. Topic: Clinical - Red Word Triage >> Nov 25, 2023  8:07 AM Treva T wrote: Red Word that prompted transfer to Nurse Triage: Patient reports she has increased coughing, hurts to cough, with fever, yellowish, thick discolored mucous, wheezing, shortness of breath. Reason for Disposition  Cough has been present for > 3 weeks  Answer Assessment - Initial Assessment Questions 1. ONSET: When did the cough begin?      Monday 2. SEVERITY: How bad is the cough today?      cough 3. SPUTUM: Describe the color of your sputum (e.g., none, dry cough; clear, white, yellow, green)     yellow 4. HEMOPTYSIS: Are you coughing up any blood? If Yes, ask: How much? (e.g., flecks, streaks, tablespoons, etc.)     no 5. DIFFICULTY BREATHING: Are you having difficulty breathing? If Yes, ask: How bad is it? (e.g., mild, moderate, severe)      SOB at times - mild to moderate 6. FEVER: Do you have a fever? If Yes, ask: What is your temperature, how was it measured, and when did it start?     yes 7. CARDIAC HISTORY: Do you have any history of heart disease? (e.g., heart attack, congestive heart failure)      na 8. LUNG HISTORY: Do you have any history of lung disease?  (e.g., pulmonary embolus, asthma, emphysema)     COPD 9. PE RISK FACTORS: Do you have a history of blood clots? (or: recent major surgery, recent prolonged travel, bedridden)     no 10. OTHER SYMPTOMS: Do you have any other symptoms? (e.g., runny nose, wheezing, chest pain)    wheezing     11.  PREGNANCY: Is there any chance you are pregnant? When was your last menstrual period?       na 12. TRAVEL: Have you traveled out of the country in the last month? (e.g., travel history, exposures)       na  Protocols used: Cough - Acute Productive-A-AH

## 2023-11-25 NOTE — Patient Instructions (Signed)
  VISIT SUMMARY: Today, you were seen for symptoms of bronchitis or pneumonia, including a persistent cough, chest tightness, and fever. You have a history of COPD and are currently using an inhaler. We discussed your recent CT scan and concerns about your heart health.  YOUR PLAN: PNEUMONIA AND ACUTE EXACERBATION OF COPD: Your symptoms and CT scan findings suggest pneumonia with mucus plugging and basilar atelectasis. -You received Rocephin  1 gm IM and Kenalog  80 mg IM.  steroids today. -You will take a long course of prednisone . -If your symptoms do not improve, we may need to do a chest x-ray. -I sent two antibiotics, augmentin  and azithromycinary.  ACUTE COUGH: You have a severe productive cough causing chest and abdominal pain. -You will take Tussionex for cough management.                      Contains text generated by Abridge.                                 Contains text generated by Abridge.

## 2023-11-25 NOTE — Progress Notes (Signed)
 Subjective:  Patient ID: Ellen Holloway, female    DOB: [birth year]  Age: 73 y.o. MRN: [id number]  Chief Complaint  Patient presents with   URI    Coughing x 1 week, fatigue, shob, bodyaches, symptoms worsened last night    HPI: Discussed the use of AI scribe software for clinical note transcription with the patient, who gave verbal consent to proceed.  History of Present Illness Ellen Holloway is a 73 year old female with COPD who presents with symptoms of bronchitis or pneumonia.  Cough and respiratory symptoms - Persistent cough causing significant discomfort and pain, described as feeling like she has 'coughed the lining out' of her abdomen and similar to having done '50 pushups' - Lungs feel 'raw' - Cough productive of firm, glob-like mucus, likened to 'Jell-O' - Chest tightness and pain associated with coughing, described as feeling like she has done crunches - Abdominal pain associated with coughing  Upper respiratory symptoms and fever - Sinus congestion present - Fever onset during the night, with temperature rising from baseline 97.55F to 99.32F at home - Tylenol used for fever management  Chronic obstructive pulmonary disease (copd) - History of COPD - Currently using an inhaler prescribed recently (type unspecified)  Medication use for symptom relief - Mucinex used for symptom management - Tussionex (containing chlorpheniramine and hydrocodone ) used for cough, noted as her preferred and effective cough medicine  Imaging and cardiac concerns - Recent CT scan performed; seeking clarification regarding findings related to heart and lungs, including concerns about mucus plugging and cholesterol buildup - Concerned about heart health due to family history of heart disease; father died from a heart attack       10/19/2023    7:47 AM 04/19/2023    8:04 AM 01/20/2023    9:45 AM 04/15/2022   10:05 AM 12/10/2021   10:44 AM  Depression screen PHQ 2/9  Decreased Interest 0  0 0 0 1  Down, Depressed, Hopeless 0 0 0 0 1  PHQ - 2 Score 0 0 0 0 2  Altered sleeping 0 1  1 1   Tired, decreased energy 1 1  0 1  Change in appetite 3 2  0 3  Feeling bad or failure about yourself  0 0  0 0  Trouble concentrating 0 0  0 0  Moving slowly or fidgety/restless 0 0  0 0  Suicidal thoughts 0 0  0 0  PHQ-9 Score 4 4  1 7   Difficult doing work/chores Not difficult at all Not difficult at all  Not difficult at all Not difficult at all        01/20/2023    8:58 AM  Fall Risk   Falls in the past year? 1  Number falls in past yr: 0  Injury with Fall? 1  Risk for fall due to : No Fall Risks  Follow up Falls evaluation completed;Education provided    Patient Care Team: Sherre Clapper, MD as PCP - General (Internal Medicine) Misenheimer, Evalene, MD as Consulting Physician (Gastroenterology) Chodri, Tanvir, MD as Referring Physician (Pulmonary Disease) Maurilio Camellia PARAS, MD as Consulting Physician (Allergy and Immunology) Rudy Adine HERO, PA-C (Orthopedic Surgery)   Review of Systems  Constitutional:  Positive for fever. Negative for chills and fatigue.  HENT:  Negative for congestion, ear pain and sore throat.   Respiratory:  Positive for cough, shortness of breath and wheezing.   Cardiovascular:  Negative for chest pain.  Gastrointestinal:  Positive for abdominal pain (  from coughing).    Current Outpatient Medications on File Prior to Visit  Medication Sig Dispense Refill   albuterol  (VENTOLIN  HFA) 108 (90 Base) MCG/ACT inhaler Inhale 2 puffs into the lungs every 6 (six) hours as needed for wheezing or shortness of breath. 24 g 3   atorvastatin  (LIPITOR) 10 MG tablet TAKE 1 TABLET BY MOUTH EVERY DAY 90 tablet 1   BIOTIN PO Take by mouth.     calcium  carbonate (OSCAL) 1500 (600 Ca) MG TABS tablet Take 600 mg of elemental calcium  by mouth daily.     clonazePAM  (KLONOPIN ) 1 MG tablet Take 1 tablet (1 mg total) by mouth daily as needed for anxiety. 30 tablet 1   COLLAGEN  PO Take by mouth.     cyclobenzaprine  (FLEXERIL ) 10 MG tablet TAKE 1 TABLET BY MOUTH EVERYDAY AT BEDTIME 90 tablet 1   famotidine  (PEPCID ) 40 MG tablet TAKE 1 TABLET BY MOUTH EVERYDAY AT BEDTIME (Patient taking differently: Take 80 mg by mouth at bedtime.) 90 tablet 2   feeding supplement (BOOST HIGH PROTEIN) LIQD Take 1 Container by mouth in the morning and at bedtime.     fluticasone-salmeterol (ADVAIR  HFA) 45-21 MCG/ACT inhaler Inhale 2 puffs into the lungs 2 (two) times daily. 1 each 3   furosemide  (LASIX ) 20 MG tablet TAKE 1 TABLET (20 MG TOTAL) BY MOUTH DAILY AS NEEDED FOR SWELLING 90 tablet 0   oxyCODONE-acetaminophen (PERCOCET) 10-325 MG tablet Take 1 tablet by mouth every 6 (six) hours.     OXYGEN  Inhale 3 L into the lungs. 2 L at night.     pramipexole  (MIRAPEX ) 1 MG tablet TAKE 1 TABLET BY MOUTH EVERYDAY AT BEDTIME 90 tablet 1   promethazine  (PHENERGAN ) 25 MG tablet Take 1 tablet (25 mg total) by mouth every 6 (six) hours as needed for nausea or vomiting. 30 tablet 1   sertraline  (ZOLOFT ) 100 MG tablet TAKE 2 TABLETS BY MOUTH EVERY DAY 180 tablet 1   spironolactone  (ALDACTONE ) 25 MG tablet TAKE 1 TABLET BY MOUTH EVERY DAY 90 tablet 1   VITAMIN D -VITAMIN K PO Take 1 tablet by mouth daily.     Current Facility-Administered Medications on File Prior to Visit  Medication Dose Route Frequency Provider Last Rate Last Admin   denosumab  (PROLIA ) injection 60 mg  60 mg Subcutaneous Q6 months Fallon Haecker, MD   60 mg at 11/30/22 0941   Past Medical History:  Diagnosis Date   Acquired absence of both cervix and uterus    Age-related osteoporosis with current pathological fracture, unspecified site, initial encounter for fracture    Asthma    Barrett's esophagus with low grade dysplasia    Chronic obstructive pulmonary disease (COPD) (HCC)    Major depressive disorder, recurrent severe without psychotic features (HCC)    Mixed hyperlipidemia    Obstructive sleep apnea (adult) (pediatric)     Other obesity due to excess calories    Other pulmonary embolism without acute cor pulmonale (HCC)    Other specified anxiety disorders    Sleep related leg cramps    Telogen effluvium    Urticaria    Past Surgical History:  Procedure Laterality Date   ABDOMINAL HYSTERECTOMY  1980   partial   BREAST BIOPSY Left 02/12/2022   US  LT BREAST BX W LOC DEV 1ST LESION IMG BX SPEC US  GUIDE 02/12/2022 [facility]   right wrist 1985      Family History  Problem Relation Age of Onset   Obesity Mother  Heart attack Father    Allergic rhinitis Sister    Thyroid  cancer Daughter    Asthma Brother    Renal Disease Brother    Breast cancer Neg Hx    Social History   Socioeconomic History   Marital status: Married    Spouse name: Francis   Number of children: 2   Years of education: Not on file   Highest education level: Bachelor's degree (e.g., BA, AB, BS)  Occupational History   Not on file  Tobacco Use   Smoking status: Former    Current packs/day: 0.00    Average packs/day: 0.5 packs/day for 55.0 years (27.5 ttl pk-yrs)    Types: E-cigarettes, Cigarettes    Start date: 1964    Quit date: 2019    Years since quitting: 6.7   Smokeless tobacco: Never   Tobacco comments:    Patient used to smoke up to 2 PPD.  Substance and Sexual Activity   Alcohol use: Yes    Alcohol/week: 1.0 standard drink of alcohol    Types: 1 Cans of beer per week    Comment: occasional beer   Drug use: Never   Sexual activity: Not on file  Other Topics Concern   Not on file  Social History Narrative   Daughter - lives in [place], [place] - lives out of state - both are very successful.   Social Drivers of Corporate investment banker Strain: Low Risk  (10/19/2023)   Overall Financial Resource Strain (CARDIA)    Difficulty of Paying Living Expenses: Not hard at all  Food Insecurity: No Food Insecurity (10/19/2023)   Hunger Vital Sign    Worried About Running Out of Food in the Last Year: Never  true    Ran Out of Food in the Last Year: Never true  Transportation Needs: No Transportation Needs (10/19/2023)   PRAPARE - Administrator, Civil Service (Medical): No    Lack of Transportation (Non-Medical): No  Physical Activity: Sufficiently Active (10/19/2023)   Exercise Vital Sign    Days of Exercise per Week: 6 days    Minutes of Exercise per Session: 60 min  Stress: Stress Concern Present (10/19/2023)   [employer] of Occupational Health - Occupational Stress Questionnaire    Feeling of Stress: To some extent  Social Connections: Moderately Isolated (10/19/2023)   Social Connection and Isolation Panel    Frequency of Communication with Friends and Family: More than three times a week    Frequency of Social Gatherings with Friends and Family: Twice a week    Attends Religious Services: Never    Diplomatic Services operational officer: No    Attends Engineer, structural: Not on file    Marital Status: Married    Objective:  BP 118/60   Pulse 72   Temp 98.3 F (36.8 C)   Ht 5' 3 (1.6 m)   Wt 99 lb (44.9 kg)   LMP  (LMP Unknown)   SpO2 99%   BMI 17.54 kg/m      11/25/2023   10:26 AM 10/19/2023    7:43 AM 05/17/2023   11:31 AM  BP/Weight  Systolic BP 118 128 100  Diastolic BP 60 72 64  Wt. (Lbs) 99 98 104.8  BMI 17.54 kg/m2 17.36 kg/m2 18.56 kg/m2    Physical Exam Vitals reviewed.  Constitutional:      Appearance: Normal appearance.  HENT:     Right Ear: Tympanic membrane, ear canal and external  ear normal.     Left Ear: Tympanic membrane, ear canal and external ear normal.     Nose: Congestion present.     Mouth/Throat:     Pharynx: Oropharynx is clear.  Cardiovascular:     Rate and Rhythm: Normal rate and regular rhythm.     Heart sounds: Normal heart sounds. No murmur heard. Pulmonary:     Effort: Pulmonary effort is normal. No respiratory distress.     Breath sounds: Wheezing and rhonchi present.  Lymphadenopathy:     Cervical: No  cervical adenopathy.  Neurological:     Mental Status: She is alert and oriented to person, place, and time.  Psychiatric:        Mood and Affect: Mood normal.        Behavior: Behavior normal.         Lab Results  Component Value Date   WBC 6.6 10/19/2023   HGB 14.7 10/19/2023   HCT 45.4 10/19/2023   PLT 212 10/19/2023   GLUCOSE 74 12/02/2023   CHOL 180 10/19/2023   TRIG 56 10/19/2023   HDL 102 10/19/2023   LDLCALC 67 10/19/2023   ALT 18 12/02/2023   AST 17 12/02/2023   NA 141 12/02/2023   K 4.1 12/02/2023   CL 97 12/02/2023   CREATININE 0.72 12/02/2023   BUN 19 12/02/2023   CO2 27 12/02/2023   TSH 0.940 10/19/2023      Assessment & Plan:  Acute cough Assessment & Plan: - Prescribe tussionex for cough management.  Orders: -     POCT Influenza A/B -     POC COVID-19 BinaxNow -     POCT rapid strep A  COPD exacerbation (HCC) Assessment & Plan: - Administer Rocephin  and steroids. - Prescribe long course of prednisone . - Consider chest x-ray if no improvement. - Discussed adding second antibiotic, such as Z-Pak, if necessary.  Orders: -     cefTRIAXone  Sodium -     Triamcinolone  Acetonide -     predniSONE ; Take 3 tablets (60 mg total) by mouth daily with breakfast for 3 days, THEN 2 tablets (40 mg total) daily with breakfast for 3 days, THEN 1 tablet (20 mg total) daily with breakfast for 3 days.  Dispense: 18 tablet; Refill: 0 -     Hydrocod Poli-Chlorphe Poli ER; Take 5 mLs by mouth every 12 (twelve) hours as needed for cough.  Dispense: 115 mL; Refill: 0  Community acquired pneumonia, unspecified laterality Assessment & Plan: - Administer Rocephin  and steroids. - Prescribe long course of prednisone . - Consider chest x-ray if no improvement. - Discussed adding second antibiotic, such as Z-Pak, if necessary.  Orders: -     Azithromycin ; Take 2 tablets on day 1, then 1 tablet daily on days 2 through 5  Dispense: 6 tablet; Refill: 0     Meds ordered  this encounter  Medications   cefTRIAXone  (ROCEPHIN ) injection 1 g   triamcinolone  acetonide (KENALOG -40) injection 80 mg   predniSONE  (DELTASONE ) 20 MG tablet    Sig: Take 3 tablets (60 mg total) by mouth daily with breakfast for 3 days, THEN 2 tablets (40 mg total) daily with breakfast for 3 days, THEN 1 tablet (20 mg total) daily with breakfast for 3 days.    Dispense:  18 tablet    Refill:  0   chlorpheniramine-HYDROcodone  (TUSSIONEX) 10-8 MG/5ML    Sig: Take 5 mLs by mouth every 12 (twelve) hours as needed for cough.    Dispense:  115 mL    Refill:  0   azithromycin  (ZITHROMAX ) 250 MG tablet    Sig: Take 2 tablets on day 1, then 1 tablet daily on days 2 through 5    Dispense:  6 tablet    Refill:  0    Orders Placed This Encounter  Procedures   POCT Influenza A/B   POC COVID-19 BinaxNow   POCT rapid strep A     Follow-up: Return if symptoms worsen or fail to improve.  I,Marla I Leal-Borjas,acting as a scribe for Abigail Free, MD.,have documented all relevant documentation on the behalf of Abigail Free, MD,as directed by  Abigail Free, MD while in the presence of Abigail Free, MD.    An After Visit Summary was printed and given to the patient.  I attest that I have reviewed this visit and agree with the plan scribed by my staff.   Abigail Free, MD [facility] [phone]

## 2023-11-29 ENCOUNTER — Ambulatory Visit: Admitting: Family Medicine

## 2023-11-29 ENCOUNTER — Ambulatory Visit: Payer: Self-pay

## 2023-11-29 NOTE — Telephone Encounter (Signed)
 Reviewed. Dr Sedalia Muta

## 2023-11-29 NOTE — Telephone Encounter (Signed)
 FYI Only or Action Required?: FYI only for provider.  Patient was last seen in primary care on 11/25/2023 by Sherre Clapper, MD.  Called Nurse Triage reporting Shortness of Breath and hypoxia.  Symptoms began 11/18/23.  Interventions attempted: Prescription medications: prednisone , azithromycin  .  Symptoms are: hypoxia, SOB, cough, headache, fever gradually worsening.  Triage Disposition: Go to ED Now (overriding Call EMS 911 Now)  Patient/caregiver understands and will follow disposition?: Yes          Copied from CRM #[id number]. Topic: Clinical - Medical Advice >> Nov 29, 2023  9:05 AM Kathrin PARAS wrote: Reason for CRM: Patient wanted an appointment and I set that but she's saying she can't breath and her  oxygen  is low and she feels she needs an xray and she has an headache Reason for Disposition  SEVERE difficulty breathing (e.g., struggling for each breath, speaks in single words, pulse > 120)  Answer Assessment - Initial Assessment Questions 1. MAIN CONCERN OR SYMPTOM: What's your main concern? (e.g., low oxygen  level, breathing difficulty) What question do you have?     She states this feels like when she had COVID (she states she has tested negative). Low oxygen  level, worsening cough and SOB.   2.  OXYGEN  EQUIPMENT:  Are you having trouble with your oxygen  equipment?  (e.g., cannula, mask, tubing, tank, concentrator)     No.  3. ONSET: When did the  cough/SOB  start?      Around 11/18/23.  4. OXYGEN  THERAPY:      She states she has her oxygen  wide open and is on 7-8L nasal cannula.  5. PULSE OXIMETER:      87% at rest.  6. O2 MONITORING: What is the oxygen  level (pulse ox reading)? (e.g., 70-100%)     She states if she gets up to move, she drops to 70s and mid 80s. She states she has oxygen  wide open. She states currently at rest she is at 87%.  7. VITAL SIGNS MONITORING: Do you monitor/measure your oxygen  level or vital signs (e.g., yes, no,  measurements are automatically sent to provider/call center). Document CURRENT and NORMAL BASELINE values if available.       Normal baseline she states SpO2 is 90-92%. When seen at the office on 11/25/23, documented at 99%.  8. BREATHING DIFFICULTY: Are you having any difficulty breathing? If Yes, ask: How bad is it?  (e.g., none, mild, moderate, severe)      Yes. Severe.  9. OTHER SYMPTOMS: Do you have any other symptoms? (e.g., fever, change in sputum)     Fever (today, 99.2), cough (states unable to get up/out the thick and congested mucus), 6/10 headache.  10. SMOKING: Do you smoke currently? Is there anyone that smokes around you?  (Note: smoking around oxygen  is dangerous!)       No.  Protocols used: COPD Oxygen  Monitoring and Hypoxia-A-AH

## 2023-12-02 ENCOUNTER — Telehealth: Payer: Self-pay

## 2023-12-02 ENCOUNTER — Ambulatory Visit (INDEPENDENT_AMBULATORY_CARE_PROVIDER_SITE_OTHER)

## 2023-12-02 DIAGNOSIS — R899 Unspecified abnormal finding in specimens from other organs, systems and tissues: Secondary | ICD-10-CM | POA: Diagnosis not present

## 2023-12-02 DIAGNOSIS — M8000XA Age-related osteoporosis with current pathological fracture, unspecified site, initial encounter for fracture: Secondary | ICD-10-CM

## 2023-12-02 NOTE — Transitions of Care (Post Inpatient/ED Visit) (Signed)
   12/02/2023  Name: Ellen Holloway MRN: [id number] DOB: 12-27-1950  Today's TOC FU Call Status: Today's TOC FU Call Status:: Unsuccessful Call (1st Attempt) Unsuccessful Call (1st Attempt) Date: 12/02/23  Attempted to reach the patient regarding the most recent Inpatient/ED visit.  Follow Up Plan: Additional outreach attempts will be made to reach the patient to complete the Transitions of Care (Post Inpatient/ED visit) call.   Signature Julian Lemmings, LPN [facility] Nurse Health Advisor Direct Dial [phone]

## 2023-12-02 NOTE — Progress Notes (Signed)
   Patient: [name]  DOB: 02-20-1951  MRN: [id number]    Visit Date: 12/02/2023    LILIE VEZINA presents today for her six month Prolia  injection.  1 x 60 mg Single-Dose Prefilled Syringe was given SQ in the right arm.  Patient tolerated the injection well and has no questions.  The next Prolia  injection will be due in six months.      Laney LITTIE Ferries, CMA

## 2023-12-03 DIAGNOSIS — R051 Acute cough: Secondary | ICD-10-CM | POA: Insufficient documentation

## 2023-12-03 DIAGNOSIS — J189 Pneumonia, unspecified organism: Secondary | ICD-10-CM | POA: Insufficient documentation

## 2023-12-03 LAB — COMPREHENSIVE METABOLIC PANEL WITH GFR
ALT: 18 IU/L (ref 0–32)
AST: 17 IU/L (ref 0–40)
Albumin: 3.9 g/dL (ref 3.8–4.8)
Alkaline Phosphatase: 70 IU/L (ref 49–135)
BUN/Creatinine Ratio: 26 (ref 12–28)
BUN: 19 mg/dL (ref 8–27)
Bilirubin Total: <0.2 mg/dL (ref 0.0–1.2)
CO2: 27 mmol/L (ref 20–29)
Calcium: 9.3 mg/dL (ref 8.7–10.3)
Chloride: 97 mmol/L (ref 96–106)
Creatinine, Ser: 0.72 mg/dL (ref 0.57–1.00)
Globulin, Total: 2 g/dL (ref 1.5–4.5)
Glucose: 74 mg/dL (ref 70–99)
Potassium: 4.1 mmol/L (ref 3.5–5.2)
Sodium: 141 mmol/L (ref 134–144)
Total Protein: 5.9 g/dL — ABNORMAL LOW (ref 6.0–8.5)
eGFR: 88 mL/min/1.73 (ref >59)

## 2023-12-03 NOTE — Assessment & Plan Note (Addendum)
-   Prescribe tussionex for cough management.

## 2023-12-03 NOTE — Transitions of Care (Post Inpatient/ED Visit) (Signed)
   12/03/2023  Name: Ellen Holloway MRN: [id number] DOB: [birth year]  Today's TOC FU Call Status: Today's TOC FU Call Status:: Unsuccessful Call (2nd Attempt) Unsuccessful Call (1st Attempt) Date: 12/02/23 Unsuccessful Call (2nd Attempt) Date: 12/03/23  Attempted to reach the patient regarding the most recent Inpatient/ED visit.  Follow Up Plan: Additional outreach attempts will be made to reach the patient to complete the Transitions of Care (Post Inpatient/ED visit) call.   Signature Julian Lemmings, LPN [facility] Nurse Health Advisor Direct Dial [phone]

## 2023-12-03 NOTE — Assessment & Plan Note (Signed)
-   Administer Rocephin  and steroids. - Prescribe long course of prednisone . - Consider chest x-ray if no improvement. - Discussed adding second antibiotic, such as Z-Pak, if necessary.

## 2023-12-04 ENCOUNTER — Encounter: Payer: Self-pay | Admitting: Family Medicine

## 2023-12-04 ENCOUNTER — Ambulatory Visit: Payer: Self-pay | Admitting: Family Medicine

## 2023-12-07 NOTE — Transitions of Care (Post Inpatient/ED Visit) (Signed)
   12/07/2023  Name: Ellen Holloway MRN: [id number] DOB: 12/22/1950  Today's TOC FU Call Status: Today's TOC FU Call Status:: Unsuccessful Call (3rd Attempt) Unsuccessful Call (1st Attempt) Date: 12/02/23 Unsuccessful Call (2nd Attempt) Date: 12/03/23 Unsuccessful Call (3rd Attempt) Date: 12/07/23  Attempted to reach the patient regarding the most recent Inpatient/ED visit.  Follow Up Plan: No further outreach attempts will be made at this time. We have been unable to contact the patient.  Signature Julian Lemmings, LPN [facility] Nurse Health Advisor Direct Dial [phone]

## 2023-12-15 ENCOUNTER — Inpatient Hospital Stay: Admitting: Family Medicine

## 2023-12-23 ENCOUNTER — Other Ambulatory Visit: Payer: Self-pay | Admitting: Family Medicine

## 2023-12-24 ENCOUNTER — Ambulatory Visit: Admitting: Family Medicine

## 2023-12-24 ENCOUNTER — Encounter: Payer: Self-pay | Admitting: Family Medicine

## 2023-12-24 VITALS — BP 110/68 | HR 92 | Temp 98.1°F | Resp 18 | Ht 63.0 in | Wt 98.0 lb

## 2023-12-24 DIAGNOSIS — M810 Age-related osteoporosis without current pathological fracture: Secondary | ICD-10-CM

## 2023-12-24 DIAGNOSIS — J441 Chronic obstructive pulmonary disease with (acute) exacerbation: Secondary | ICD-10-CM

## 2023-12-24 DIAGNOSIS — R634 Abnormal weight loss: Secondary | ICD-10-CM

## 2023-12-24 DIAGNOSIS — Z23 Encounter for immunization: Secondary | ICD-10-CM

## 2023-12-24 DIAGNOSIS — R112 Nausea with vomiting, unspecified: Secondary | ICD-10-CM | POA: Diagnosis not present

## 2023-12-24 MED ORDER — PROMETHAZINE HCL 25 MG PO TABS: 25.0000 mg | ORAL_TABLET | Freq: Four times a day (QID) | ORAL | 1 refills | Status: AC | PRN

## 2023-12-24 NOTE — Progress Notes (Signed)
 Subjective:  Patient ID: Ellen Holloway, female    DOB: 07/23/1950  Age: 73 y.o. MRN: [id number]  Chief Complaint  Patient presents with   Hospitalization Follow-up    Discussed the use of AI scribe software for clinical note transcription with the patient, who gave verbal consent to proceed.  History of Present Illness   Ellen Holloway is a 73 year old female who presents for follow-up after a recent hospitalization for low oxygen  levels.  COPD exacerbation - Recent hospitalization for three days due to hypoxemia and difficulty breathing, discharged on 12/01/23 - Oxygen  saturation dropped to 72% during initial episode - Currently, oxygen  saturation is around 88% without supplemental oxygen  - Desaturation occurs with exertion or talking - Continues to use Advair , two puffs twice daily, and albuterol  as needed, especially during respiratory infections - Uses a personal nebulizer machine - Initial presentation included cough in addition to dyspnea - Started on medications for suspected pneumonia during hospitalization  Unintentional weight loss - Significant weight loss, now weighing 98 pounds - No specific cause identified despite testing - Consuming two high-protein Boost drinks daily to help maintain weight - Weight appears to be stabilizing with nutritional supplementation  Nausea - Occasional severe nausea - Uses Phenergan  as needed for symptom management - Considers Phenergan  part of her 'toolbox' for managing medication-related symptoms         10/19/2023    7:47 AM 04/19/2023    8:04 AM 01/20/2023    9:45 AM 04/15/2022   10:05 AM 12/10/2021   10:44 AM  Depression screen PHQ 2/9  Decreased Interest 0 0 0 0 1  Down, Depressed, Hopeless 0 0 0 0 1  PHQ - 2 Score 0 0 0 0 2  Altered sleeping 0 1  1 1   Tired, decreased energy 1 1  0 1  Change in appetite 3 2  0 3  Feeling bad or failure about yourself  0 0  0 0  Trouble concentrating 0 0  0 0  Moving slowly or  fidgety/restless 0 0  0 0  Suicidal thoughts 0 0  0 0  PHQ-9 Score 4 4  1 7   Difficult doing work/chores Not difficult at all Not difficult at all  Not difficult at all Not difficult at all        01/20/2023    8:58 AM  Fall Risk   Falls in the past year? 1  Number falls in past yr: 0  Injury with Fall? 1  Risk for fall due to : No Fall Risks  Follow up Falls evaluation completed;Education provided    Patient Care Team: Sherre Clapper, MD as PCP - General (Internal Medicine) Misenheimer, Evalene, MD as Consulting Physician (Gastroenterology) Chodri, Tanvir, MD as Referring Physician (Pulmonary Disease) Maurilio Camellia PARAS, MD as Consulting Physician (Allergy and Immunology) Rudy Adine HERO, PA-C (Orthopedic Surgery)   Review of Systems  Constitutional:  Negative for chills, diaphoresis, fatigue and fever.  HENT:  Negative for congestion, ear pain and sinus pain.   Eyes: Negative.   Respiratory:  Negative for cough and shortness of breath.   Cardiovascular:  Negative for chest pain.  Gastrointestinal:  Positive for nausea. Negative for abdominal pain, constipation, diarrhea and vomiting.  Endocrine: Negative.   Genitourinary:  Negative for dysuria, frequency and urgency.  Musculoskeletal:  Negative for arthralgias.  Allergic/Immunologic: Negative.   Neurological:  Negative for dizziness, weakness, light-headedness and headaches.  Psychiatric/Behavioral:  Negative for dysphoric mood. The patient is not  nervous/anxious.     Current Outpatient Medications on File Prior to Visit  Medication Sig Dispense Refill   albuterol  (VENTOLIN  HFA) 108 (90 Base) MCG/ACT inhaler Inhale 2 puffs into the lungs every 6 (six) hours as needed for wheezing or shortness of breath. 24 g 3   atorvastatin  (LIPITOR) 10 MG tablet TAKE 1 TABLET BY MOUTH EVERY DAY 90 tablet 1   calcium  carbonate (OSCAL) 1500 (600 Ca) MG TABS tablet Take 600 mg of elemental calcium  by mouth daily.     clonazePAM  (KLONOPIN ) 1 MG  tablet Take 1 tablet (1 mg total) by mouth daily as needed for anxiety. 30 tablet 1   cyclobenzaprine  (FLEXERIL ) 10 MG tablet TAKE 1 TABLET BY MOUTH EVERYDAY AT BEDTIME 90 tablet 1   famotidine  (PEPCID ) 40 MG tablet TAKE 1 TABLET BY MOUTH EVERYDAY AT BEDTIME 90 tablet 2   feeding supplement (BOOST HIGH PROTEIN) LIQD Take 1 Container by mouth in the morning and at bedtime.     fluticasone-salmeterol (ADVAIR  HFA) 45-21 MCG/ACT inhaler Inhale 2 puffs into the lungs 2 (two) times daily. 1 each 3   furosemide  (LASIX ) 20 MG tablet TAKE 1 TABLET (20 MG TOTAL) BY MOUTH DAILY AS NEEDED FOR SWELLING 90 tablet 0   oxyCODONE-acetaminophen (PERCOCET) 10-325 MG tablet Take 1 tablet by mouth every 6 (six) hours.     OXYGEN  Inhale 3 L into the lungs. 2 L at night.     pramipexole  (MIRAPEX ) 1 MG tablet TAKE 1 TABLET BY MOUTH EVERYDAY AT BEDTIME 90 tablet 1   sertraline  (ZOLOFT ) 100 MG tablet TAKE 2 TABLETS BY MOUTH EVERY DAY 180 tablet 1   spironolactone  (ALDACTONE ) 25 MG tablet TAKE 1 TABLET BY MOUTH EVERY DAY 90 tablet 1   VITAMIN D -VITAMIN K PO Take 1 tablet by mouth daily.     Current Facility-Administered Medications on File Prior to Visit  Medication Dose Route Frequency Provider Last Rate Last Admin   denosumab  (PROLIA ) injection 60 mg  60 mg Subcutaneous Q6 months Cox, Kirsten, MD   60 mg at 11/30/22 9058   Past Medical History:  Diagnosis Date   Acquired absence of both cervix and uterus    Age-related osteoporosis with current pathological fracture, unspecified site, initial encounter for fracture    Allergy    Asthma    Barrett's esophagus with low grade dysplasia    Cataract 2016   They were removed   Chronic obstructive pulmonary disease (COPD) (HCC)    GERD (gastroesophageal reflux disease)    Major depressive disorder, recurrent severe without psychotic features (HCC)    Mixed hyperlipidemia    Obstructive sleep apnea (adult) (pediatric)    Other obesity due to excess calories    Other  pulmonary embolism without acute cor pulmonale (HCC)    Other specified anxiety disorders    Oxygen  deficiency 2016   Sleep related leg cramps    Telogen effluvium    Urticaria    Past Surgical History:  Procedure Laterality Date   ABDOMINAL HYSTERECTOMY  1980   partial   BREAST BIOPSY Left 02/12/2022   US  LT BREAST BX W LOC DEV 1ST LESION IMG BX SPEC US  GUIDE 02/12/2022 [facility]   CESAREAN SECTION  06/24/1978   EYE SURGERY     Cataracts   right wrist 1985     SPINE SURGERY     Had lower back, herinated disk    Family History  Problem Relation Age of Onset   Obesity Mother  Varicose Veins Mother    Heart attack Father    Heart disease Father    Allergic rhinitis Sister    Thyroid  cancer Daughter    Asthma Brother    Renal Disease Brother    Kidney disease Brother    Breast cancer Neg Hx    Social History   Socioeconomic History   Marital status: Married    Spouse name: Francis   Number of children: 2   Years of education: Not on file   Highest education level: Bachelor's degree (e.g., BA, AB, BS)  Occupational History   Not on file  Tobacco Use   Smoking status: Former    Current packs/day: 0.00    Average packs/day: 0.5 packs/day for 55.0 years (27.5 ttl pk-yrs)    Types: Cigarettes, E-cigarettes    Start date: 1964    Quit date: 2019    Years since quitting: 6.7   Smokeless tobacco: Never   Tobacco comments:    Patient used to smoke up to 2 PPD.  Substance and Sexual Activity   Alcohol use: Yes    Alcohol/week: 2.0 standard drinks of alcohol    Types: 1 Glasses of wine, 1 Cans of beer per week    Comment: When we have pizza or gi out for nice dinner, never together   Drug use: Never   Sexual activity: Not Currently    Birth control/protection: Other-see comments, None    Comment: I am 31,  Other Topics Concern   Not on file  Social History Narrative   Daughter - lives in [place], [place] - lives out of state - both are very successful.    Social Drivers of Corporate investment banker Strain: Low Risk  (10/19/2023)   Overall Financial Resource Strain (CARDIA)    Difficulty of Paying Living Expenses: Not hard at all  Food Insecurity: No Food Insecurity (10/19/2023)   Hunger Vital Sign    Worried About Running Out of Food in the Last Year: Never true    Ran Out of Food in the Last Year: Never true  Transportation Needs: No Transportation Needs (10/19/2023)   PRAPARE - Administrator, Civil Service (Medical): No    Lack of Transportation (Non-Medical): No  Physical Activity: Sufficiently Active (10/19/2023)   Exercise Vital Sign    Days of Exercise per Week: 6 days    Minutes of Exercise per Session: 60 min  Stress: Stress Concern Present (10/19/2023)   [employer] of Occupational Health - Occupational Stress Questionnaire    Feeling of Stress: To some extent  Social Connections: Moderately Isolated (10/19/2023)   Social Connection and Isolation Panel    Frequency of Communication with Friends and Family: More than three times a week    Frequency of Social Gatherings with Friends and Family: Twice a week    Attends Religious Services: Never    Diplomatic Services operational officer: No    Attends Engineer, structural: Not on file    Marital Status: Married    Objective:  BP 110/68 (BP Location: Left Arm, Patient Position: Sitting, Cuff Size: Normal)   Pulse 92   Temp 98.1 F (36.7 C) (Temporal)   Resp 18   Ht 5' 3 (1.6 m)   Wt 98 lb (44.5 kg)   LMP  (LMP Unknown)   SpO2 (!) 88%   BMI 17.36 kg/m      12/24/2023    8:37 AM 12/24/2023    7:59 AM 11/25/2023  10:26 AM  BP/Weight  Systolic BP 110 114 118  Diastolic BP 68 70 60  Wt. (Lbs)  98 99  BMI  17.36 kg/m2 17.54 kg/m2    Physical Exam Vitals reviewed.  Constitutional:      General: She is not in acute distress.    Appearance: Normal appearance. She is not ill-appearing.     Comments: underweight  Eyes:      Conjunctiva/sclera: Conjunctivae normal.  Cardiovascular:     Rate and Rhythm: Normal rate and regular rhythm.     Heart sounds: Normal heart sounds. No murmur heard. Pulmonary:     Effort: Pulmonary effort is normal. No respiratory distress.     Breath sounds: Normal breath sounds. No wheezing or rhonchi.  Abdominal:     General: Bowel sounds are normal.     Palpations: Abdomen is soft.     Tenderness: There is no abdominal tenderness.  Musculoskeletal:        General: Normal range of motion.     Cervical back: Normal range of motion.  Skin:    General: Skin is warm.  Neurological:     Mental Status: She is alert. Mental status is at baseline.  Psychiatric:        Mood and Affect: Mood normal.        Behavior: Behavior normal.     Lab Results  Component Value Date   WBC 6.6 10/19/2023   HGB 14.7 10/19/2023   HCT 45.4 10/19/2023   PLT 212 10/19/2023   GLUCOSE 74 12/02/2023   CHOL 180 10/19/2023   TRIG 56 10/19/2023   HDL 102 10/19/2023   LDLCALC 67 10/19/2023   ALT 18 12/02/2023   AST 17 12/02/2023   NA 141 12/02/2023   K 4.1 12/02/2023   CL 97 12/02/2023   CREATININE 0.72 12/02/2023   BUN 19 12/02/2023   CO2 27 12/02/2023   TSH 0.940 10/19/2023    Results for orders placed or performed in visit on 12/02/23  Comprehensive metabolic panel with GFR   Collection Time: 12/02/23  8:51 AM  Result Value Ref Range   Glucose 74 70 - 99 mg/dL   BUN 19 8 - 27 mg/dL   Creatinine, Ser 9.27 0.57 - 1.00 mg/dL   eGFR 88 >40 fO/fpw/8.26   BUN/Creatinine Ratio 26 12 - 28   Sodium 141 134 - 144 mmol/L   Potassium 4.1 3.5 - 5.2 mmol/L   Chloride 97 96 - 106 mmol/L   CO2 27 20 - 29 mmol/L   Calcium  9.3 8.7 - 10.3 mg/dL   Total Protein 5.9 (L) 6.0 - 8.5 g/dL   Albumin 3.9 3.8 - 4.8 g/dL   Globulin, Total 2.0 1.5 - 4.5 g/dL   Bilirubin Total <9.7 0.0 - 1.2 mg/dL   Alkaline Phosphatase 70 49 - 135 IU/L   AST 17 0 - 40 IU/L   ALT 18 0 - 32 IU/L  .  Assessment & Plan:    Assessment & Plan COPD exacerbation (HCC) Chronic respiratory failure with hypoxemia and chronic obstructive pulmonary disease (COPD) Experienced acute COPD exacerbation with severe hypoxemia. Hospitalized and treated with medications. Oxygen  saturation improved to 88% without supplemental oxygen  but decreases with exertion. - Continue Advair  two puffs twice daily. - Continue albuterol  as needed. - Monitor oxygen  saturation levels, especially during exertion.  Orders:   Comprehensive metabolic panel with GFR  Nausea and vomiting in adult Intermittent severe nausea managed with Phenergan  as needed. - Refill Phenergan  prescription for  use as needed.  Orders:   promethazine  (PHENERGAN ) 25 MG tablet; Take 1 tablet (25 mg total) by mouth every 6 (six) hours as needed for nausea or vomiting.  Unintentional weight loss Unintentional weight loss Significant weight loss from 178 lbs to 98 lbs, likely due to increased metabolic demand from chronic respiratory issues. Thyroid  function tests normal. - Continue high-protein nutritional supplements (Boost) twice daily.     Osteoporosis, postmenopausal Osteoporosis on Prolia  therapy Osteoporosis managed with Prolia  injections. - Continue Prolia  injections as scheduled.     Encounter for immunization  Orders:   Flu vaccine HIGH DOSE PF(Fluzone Trivalent)    Follow-up: No follow-ups on file.  An After Visit Summary was printed and given to the patient.  Harrie Cedar, FNP [facility] [phone]

## 2023-12-24 NOTE — Addendum Note (Signed)
 Addended by: TERESSA HARRIE HERO on: 12/24/2023 11:00 AM   Modules accepted: Level of Service

## 2023-12-24 NOTE — Patient Instructions (Signed)
  VISIT SUMMARY: You had a follow-up appointment today after your recent hospitalization for low oxygen  levels. We discussed your ongoing issues with breathing, weight loss, nausea, and low blood pressure. We also reviewed your current medications and nutritional supplements.  YOUR PLAN: -CHRONIC RESPIRATORY FAILURE WITH HYPOXEMIA AND CHRONIC OBSTRUCTIVE PULMONARY DISEASE (COPD): This means you have long-term lung problems that make it hard to breathe and keep your oxygen  levels low. Continue using Advair  two puffs twice daily and albuterol  as needed. Monitor your oxygen  levels, especially when you are active.  -UNINTENTIONAL WEIGHT LOSS: You have lost a significant amount of weight, likely due to your chronic respiratory issues. Continue taking high-protein Boost drinks twice daily to help maintain your weight.  -CHRONIC NAUSEA: You experience severe nausea from time to time. Continue using Phenergan  as needed to manage this symptom. Your prescription for Phenergan  has been refilled.  -OSTEOPOROSIS ON PROLIA  THERAPY: You have weak bones, which are being treated with Prolia  injections. Continue with your Prolia  injections as scheduled.  -HYPOTENSION: You have low blood pressure, which is generally not a concern. Your recent blood pressure reading was 114/70 mmHg.  -GENERAL HEALTH MAINTENANCE: We discussed the hepatitis B vaccination for individuals aged 22 and older with risk factors. We will evaluate your need for this vaccination based on your risk factors.  INSTRUCTIONS: Please continue with your current medications and nutritional supplements as discussed. Monitor your oxygen  levels, especially during physical activity. If you experience any worsening of symptoms or new issues, please contact our office. We will evaluate your need for the hepatitis B vaccination based on your risk factors at your next visit.                      Contains text generated by [employer].                                  Contains text generated by [employer]bridge.

## 2023-12-24 NOTE — Assessment & Plan Note (Addendum)
 Intermittent severe nausea managed with Phenergan  as needed. - Refill Phenergan  prescription for use as needed.  Orders:   promethazine  (PHENERGAN ) 25 MG tablet; Take 1 tablet (25 mg total) by mouth every 6 (six) hours as needed for nausea or vomiting.

## 2023-12-24 NOTE — Assessment & Plan Note (Addendum)
 Unintentional weight loss Significant weight loss from 178 lbs to 98 lbs, likely due to increased metabolic demand from chronic respiratory issues. Thyroid  function tests normal. - Continue high-protein nutritional supplements (Boost) twice daily.

## 2023-12-24 NOTE — Assessment & Plan Note (Addendum)
 Chronic respiratory failure with hypoxemia and chronic obstructive pulmonary disease (COPD) Experienced acute COPD exacerbation with severe hypoxemia. Hospitalized and treated with medications. Oxygen  saturation improved to 88% without supplemental oxygen  but decreases with exertion. - Continue Advair  two puffs twice daily. - Continue albuterol  as needed. - Monitor oxygen  saturation levels, especially during exertion.  Orders:   Comprehensive metabolic panel with GFR

## 2023-12-24 NOTE — Assessment & Plan Note (Addendum)
 Osteoporosis on Prolia  therapy Osteoporosis managed with Prolia  injections. - Continue Prolia  injections as scheduled.

## 2023-12-25 LAB — COMPREHENSIVE METABOLIC PANEL WITH GFR
ALT: 22 IU/L (ref 0–32)
AST: 25 IU/L (ref 0–40)
Albumin: 4.3 g/dL (ref 3.8–4.8)
Alkaline Phosphatase: 75 IU/L (ref 49–135)
BUN/Creatinine Ratio: 16 (ref 12–28)
BUN: 12 mg/dL (ref 8–27)
Bilirubin Total: <0.2 mg/dL (ref 0.0–1.2)
CO2: 25 mmol/L (ref 20–29)
Calcium: 9.2 mg/dL (ref 8.7–10.3)
Chloride: 101 mmol/L (ref 96–106)
Creatinine, Ser: 0.73 mg/dL (ref 0.57–1.00)
Globulin, Total: 2.1 g/dL (ref 1.5–4.5)
Glucose: 96 mg/dL (ref 70–99)
Potassium: 4.4 mmol/L (ref 3.5–5.2)
Sodium: 139 mmol/L (ref 134–144)
Total Protein: 6.4 g/dL (ref 6.0–8.5)
eGFR: 86 mL/min/1.73 (ref >59)

## 2023-12-26 ENCOUNTER — Ambulatory Visit: Payer: Self-pay | Admitting: Family Medicine

## 2023-12-27 ENCOUNTER — Telehealth: Payer: Self-pay

## 2023-12-27 NOTE — Telephone Encounter (Signed)
PA submitted and approved via covermymeds for promethazine.

## 2024-01-26 NOTE — Assessment & Plan Note (Addendum)
 Managed with sertraline  200 mg daily. No significant depressive symptoms reported.

## 2024-01-26 NOTE — Assessment & Plan Note (Addendum)
 Managed with Prolia  and calcium  supplementation.

## 2024-01-26 NOTE — Assessment & Plan Note (Addendum)
 Managed with albuterol  inhaler as needed. Uses inhaler a couple of times a week. Oxygen  therapy at night at 3 liters per minute.

## 2024-01-26 NOTE — Progress Notes (Signed)
 Subjective:  Patient ID: Ellen Holloway, female    DOB: 29-Aug-1950  Age: 73 y.o. MRN: [id number]  Chief Complaint  Patient presents with   Medical Management of Chronic Issues    HPI: Discussed the use of AI scribe software for clinical note transcription with the patient, who gave verbal consent to proceed.  History of Present Illness Ellen Holloway is a 73 year old female who presents with persistent fatigue.  Fatigue and daytime somnolence - Persistent fatigue, described as feeling 'super tired at night' and frequently falling asleep on the couch - Daytime somnolence with episodes of dozing off despite perceived adequate nocturnal sleep - No recent fevers, chills, or earaches since last hospitalization  Sleep disorders and oxygen  use - History of sleep apnea, believed to be resolved after weight loss - Recent sleep study reported as normal since weight loss - Uses oxygen  at night at 3 liters - Has a portable oxygen  unit for daytime use if needed  Mood and affect - Uncertain if feeling down, depressed, or sad - Remains busy and does not focus on mood symptoms  Edema and diuretic use - No swelling reported - Does not take Lasix  regularly - Uncertain about current use of spironolactone ; may not be taking it  Medication use - Pramipexole  1 mg at bedtime for restless legs - Prolia  and calcium  for osteoporosis - Cyclobenzaprine  at bedtime - Pepcid  for acid reflux - Lipitor 10 mg daily for hyperlipidemia - Percocet 10/325 mg four times a day prescribed by pain management - Has had issues with medication refills due to changes in management at pain clinic - Sertraline  200 mg daily for depression - Vitamin D  supplementation - Albuterol  inhaler used a couple of times a week as needed - Clonazepam  1 mg at bedtime as needed for anxiety        01/27/2024    8:31 AM 01/27/2024    7:43 AM 10/19/2023    7:47 AM 04/19/2023    8:04 AM 01/20/2023    9:45 AM  Depression screen  PHQ 2/9  Decreased Interest 0 0 0 0 0  Down, Depressed, Hopeless 0 0 0 0 0  PHQ - 2 Score 0 0 0 0 0  Altered sleeping 0 0 0 1   Tired, decreased energy 3 3 1 1    Change in appetite 1 1 3 2    Feeling bad or failure about yourself  0 0 0 0   Trouble concentrating 0 0 0 0   Moving slowly or fidgety/restless  0 0 0   Suicidal thoughts 0 0 0 0   PHQ-9 Score 4 4 4  4     Difficult doing work/chores Not difficult at all Not difficult at all Not difficult at all Not difficult at all      Data saved with a previous flowsheet row definition        01/27/2024    8:06 AM  Fall Risk   Falls in the past year? 1  Number falls in past yr: 0  Injury with Fall? 0  Risk for fall due to : History of fall(s)  Follow up Falls evaluation completed    Patient Care Team: Sherre Clapper, MD as PCP - General (Internal Medicine) Misenheimer, Evalene, MD as Consulting Physician (Gastroenterology) Chodri, Tanvir, MD as Referring Physician (Pulmonary Disease) Maurilio Camellia PARAS, MD as Consulting Physician (Allergy and Immunology) Rudy Adine HERO, PA-C (Orthopedic Surgery)   Review of Systems  Constitutional:  Positive for fatigue. Negative  for chills and fever.  HENT:  Negative for congestion, ear pain and sore throat.   Respiratory:  Negative for cough and shortness of breath.   Cardiovascular:  Negative for chest pain.  Gastrointestinal:  Negative for abdominal pain, constipation, diarrhea, nausea and vomiting.  Genitourinary:  Negative for dysuria and urgency.  Musculoskeletal:  Negative for arthralgias and myalgias.  Skin:  Negative for rash.  Neurological:  Negative for dizziness and headaches.  Psychiatric/Behavioral:  Negative for dysphoric mood. The patient is not nervous/anxious.     Current Outpatient Medications on File Prior to Visit  Medication Sig Dispense Refill   albuterol  (VENTOLIN  HFA) 108 (90 Base) MCG/ACT inhaler Inhale 2 puffs into the lungs every 6 (six) hours as needed for  wheezing or shortness of breath. 24 g 3   atorvastatin  (LIPITOR) 10 MG tablet TAKE 1 TABLET BY MOUTH EVERY DAY 90 tablet 1   calcium  carbonate (OSCAL) 1500 (600 Ca) MG TABS tablet Take 600 mg of elemental calcium  by mouth daily.     clonazePAM  (KLONOPIN ) 1 MG tablet Take 1 tablet (1 mg total) by mouth daily as needed for anxiety. 30 tablet 1   cyclobenzaprine  (FLEXERIL ) 10 MG tablet TAKE 1 TABLET BY MOUTH EVERYDAY AT BEDTIME 90 tablet 1   famotidine  (PEPCID ) 40 MG tablet TAKE 1 TABLET BY MOUTH EVERYDAY AT BEDTIME 90 tablet 2   feeding supplement (BOOST HIGH PROTEIN) LIQD Take 1 Container by mouth in the morning and at bedtime.     furosemide  (LASIX ) 20 MG tablet TAKE 1 TABLET (20 MG TOTAL) BY MOUTH DAILY AS NEEDED FOR SWELLING 90 tablet 0   oxyCODONE-acetaminophen (PERCOCET) 10-325 MG tablet Take 1 tablet by mouth every 6 (six) hours.     OXYGEN  Inhale 3 L into the lungs. 2 L at night.     pramipexole  (MIRAPEX ) 1 MG tablet TAKE 1 TABLET BY MOUTH EVERYDAY AT BEDTIME 90 tablet 1   promethazine  (PHENERGAN ) 25 MG tablet Take 1 tablet (25 mg total) by mouth every 6 (six) hours as needed for nausea or vomiting. 30 tablet 1   sertraline  (ZOLOFT ) 100 MG tablet TAKE 2 TABLETS BY MOUTH EVERY DAY 180 tablet 1   VITAMIN D -VITAMIN K PO Take 1 tablet by mouth daily.     Current Facility-Administered Medications on File Prior to Visit  Medication Dose Route Frequency Provider Last Rate Last Admin   denosumab  (PROLIA ) injection 60 mg  60 mg Subcutaneous Q6 months Arnetha Silverthorne, MD   60 mg at 11/30/22 9058   Past Medical History:  Diagnosis Date   Acquired absence of both cervix and uterus    Age-related osteoporosis with current pathological fracture, unspecified site, initial encounter for fracture    Allergy    Asthma    Barrett's esophagus with low grade dysplasia    Cataract 2016   They were removed   Chronic obstructive pulmonary disease (COPD) (HCC)    GERD (gastroesophageal reflux disease)     Major depressive disorder, recurrent severe without psychotic features (HCC)    Mixed hyperlipidemia    Obstructive sleep apnea (adult) (pediatric)    Other obesity due to excess calories    Other pulmonary embolism without acute cor pulmonale (HCC)    Other specified anxiety disorders    Oxygen  deficiency 2016   Sleep related leg cramps    Telogen effluvium    Urticaria    Past Surgical History:  Procedure Laterality Date   ABDOMINAL HYSTERECTOMY  1980   partial  BREAST BIOPSY Left 02/12/2022   US  LT BREAST BX W LOC DEV 1ST LESION IMG BX SPEC US  GUIDE 02/12/2022 [facility]   CESAREAN SECTION  06/24/1978   EYE SURGERY     Cataracts   right wrist 1985     SPINE SURGERY     Had lower back, herinated disk    Family History  Problem Relation Age of Onset   Obesity Mother    Varicose Veins Mother    Heart attack Father    Heart disease Father    Allergic rhinitis Sister    Thyroid  cancer Daughter    Asthma Brother    Renal Disease Brother    Kidney disease Brother    Breast cancer Neg Hx    Social History   Socioeconomic History   Marital status: Married    Spouse name: Francis   Number of children: 2   Years of education: Not on file   Highest education level: Bachelor's degree (e.g., BA, AB, BS)  Occupational History   Not on file  Tobacco Use   Smoking status: Former    Current packs/day: 0.00    Average packs/day: 0.5 packs/day for 55.0 years (27.5 ttl pk-yrs)    Types: Cigarettes, E-cigarettes    Start date: 1964    Quit date: 2019    Years since quitting: 6.8   Smokeless tobacco: Never   Tobacco comments:    Patient used to smoke up to 2 PPD.  Substance and Sexual Activity   Alcohol use: Yes    Alcohol/week: 2.0 standard drinks of alcohol    Types: 1 Glasses of wine, 1 Cans of beer per week    Comment: When we have pizza or gi out for nice dinner, never together   Drug use: Never   Sexual activity: Not Currently    Birth control/protection:  Other-see comments, None    Comment: I am 22,  Other Topics Concern   Not on file  Social History Narrative   Daughter - lives in [place], [place] - lives out of state - both are very successful.   Social Drivers of Health   Financial Resource Strain: Medium Risk (01/27/2024)   Overall Financial Resource Strain (CARDIA)    Difficulty of Paying Living Expenses: Somewhat hard  Food Insecurity: No Food Insecurity (01/27/2024)   Hunger Vital Sign    Worried About Running Out of Food in the Last Year: Never true    Ran Out of Food in the Last Year: Never true  Transportation Needs: No Transportation Needs (01/27/2024)   PRAPARE - Administrator, Civil Service (Medical): No    Lack of Transportation (Non-Medical): No  Physical Activity: Insufficiently Active (01/27/2024)   Exercise Vital Sign    Days of Exercise per Week: 4 days    Minutes of Exercise per Session: 10 min  Stress: Stress Concern Present (01/27/2024)   [employer] of Occupational Health - Occupational Stress Questionnaire    Feeling of Stress: To some extent  Social Connections: Moderately Isolated (01/27/2024)   Social Connection and Isolation Panel    Frequency of Communication with Friends and Family: More than three times a week    Frequency of Social Gatherings with Friends and Family: Once a week    Attends Religious Services: Never    Database Administrator or Organizations: No    Attends Banker Meetings: Never    Marital Status: Married    Objective:  BP 128/70   Pulse  83   Temp 98.2 F (36.8 C)   Ht 5' 3 (1.6 m)   Wt 102 lb (46.3 kg)   LMP  (LMP Unknown)   SpO2 98%   BMI 18.07 kg/m      01/27/2024    8:01 AM 01/27/2024    7:42 AM 12/24/2023    8:37 AM  BP/Weight  Systolic BP 142 128 110  Diastolic BP 82 70 68  Wt. (Lbs) 102 102   BMI 18.07 kg/m2 18.07 kg/m2     Physical Exam Vitals reviewed.  Constitutional:      Appearance: Normal appearance.      Comments: Thin   Neck:     Vascular: No carotid bruit.  Cardiovascular:     Rate and Rhythm: Normal rate and regular rhythm.     Heart sounds: Normal heart sounds.  Pulmonary:     Effort: Pulmonary effort is normal. No respiratory distress.     Breath sounds: Normal breath sounds.  Abdominal:     General: Abdomen is flat. Bowel sounds are normal.     Palpations: Abdomen is soft.     Tenderness: There is no abdominal tenderness.  Neurological:     Mental Status: She is alert and oriented to person, place, and time.  Psychiatric:        Mood and Affect: Mood normal.        Behavior: Behavior normal.         Lab Results  Component Value Date   WBC 6.6 10/19/2023   HGB 14.7 10/19/2023   HCT 45.4 10/19/2023   PLT 212 10/19/2023   GLUCOSE 96 12/24/2023   CHOL 180 10/19/2023   TRIG 56 10/19/2023   HDL 102 10/19/2023   LDLCALC 67 10/19/2023   ALT 22 12/24/2023   AST 25 12/24/2023   NA 139 12/24/2023   K 4.4 12/24/2023   CL 101 12/24/2023   CREATININE 0.73 12/24/2023   BUN 12 12/24/2023   CO2 25 12/24/2023   TSH 0.940 10/19/2023    Results for orders placed or performed in visit on 12/24/23  Comprehensive metabolic panel with GFR   Collection Time: 12/24/23  8:56 AM  Result Value Ref Range   Glucose 96 70 - 99 mg/dL   BUN 12 8 - 27 mg/dL   Creatinine, Ser 9.26 0.57 - 1.00 mg/dL   eGFR 86 >40 fO/fpw/8.26   BUN/Creatinine Ratio 16 12 - 28   Sodium 139 134 - 144 mmol/L   Potassium 4.4 3.5 - 5.2 mmol/L   Chloride 101 96 - 106 mmol/L   CO2 25 20 - 29 mmol/L   Calcium  9.2 8.7 - 10.3 mg/dL   Total Protein 6.4 6.0 - 8.5 g/dL   Albumin 4.3 3.8 - 4.8 g/dL   Globulin, Total 2.1 1.5 - 4.5 g/dL   Bilirubin Total <9.7 0.0 - 1.2 mg/dL   Alkaline Phosphatase 75 49 - 135 IU/L   AST 25 0 - 40 IU/L   ALT 22 0 - 32 IU/L  .  Assessment & Plan:   Assessment & Plan Other fatigue Check labs Orders:   CBC with Differential/Platelet   Comprehensive metabolic panel with  GFR  Depression, major, recurrent, mild Managed with sertraline  200 mg daily. No significant depressive symptoms reported.    Osteoporosis, postmenopausal Managed with Prolia  and calcium  supplementation.    Mixed hyperlipidemia Managed with Lipitor 10 mg daily.    Simple chronic bronchitis (HCC) Managed with albuterol  inhaler as needed. Uses inhaler a  couple of times a week. Oxygen  therapy at night at 3 liters per minute.    B12 deficiency Check levels Orders:   Vitamin B12   Methylmalonic acid, serum  RLS (restless legs syndrome) Managed with pramipexole  1 mg at bedtime.    Chronic pain syndrome Managed with Percocet 10/325 mg every 6 hours as needed. Issues with medication refills due to changes at the pain clinic. - Advised to walk in to the pain clinic if unable to get through by phone.    Body mass index is 18.07 kg/m.   No orders of the defined types were placed in this encounter.   Orders Placed This Encounter  Procedures   CBC with Differential/Platelet   Comprehensive metabolic panel with GFR   Vitamin B12   Methylmalonic acid, serum    I,Marla I Leal-Borjas,acting as a scribe for Abigail Free, MD.,have documented all relevant documentation on the behalf of Abigail Free, MD,as directed by  Abigail Free, MD while in the presence of Abigail Free, MD.   Follow-up: Return in about 3 months (around 04/28/2024).  An After Visit Summary was printed and given to the patient.  I attest that I have reviewed this visit and agree with the plan scribed by my staff.   Abigail Free, MD [facility] [phone]

## 2024-01-26 NOTE — Assessment & Plan Note (Addendum)
 Managed with Lipitor 10 mg daily.

## 2024-01-27 ENCOUNTER — Ambulatory Visit (INDEPENDENT_AMBULATORY_CARE_PROVIDER_SITE_OTHER)

## 2024-01-27 ENCOUNTER — Ambulatory Visit (INDEPENDENT_AMBULATORY_CARE_PROVIDER_SITE_OTHER): Admitting: Family Medicine

## 2024-01-27 VITALS — BP 128/70 | HR 83 | Temp 98.2°F | Ht 63.0 in | Wt 102.0 lb

## 2024-01-27 VITALS — BP 142/82 | HR 92 | Temp 98.2°F | Ht 63.0 in | Wt 102.0 lb

## 2024-01-27 DIAGNOSIS — E782 Mixed hyperlipidemia: Secondary | ICD-10-CM

## 2024-01-27 DIAGNOSIS — Z0000 Encounter for general adult medical examination without abnormal findings: Secondary | ICD-10-CM

## 2024-01-27 DIAGNOSIS — F330 Major depressive disorder, recurrent, mild: Secondary | ICD-10-CM

## 2024-01-27 DIAGNOSIS — G2581 Restless legs syndrome: Secondary | ICD-10-CM

## 2024-01-27 DIAGNOSIS — J410 Simple chronic bronchitis: Secondary | ICD-10-CM | POA: Diagnosis not present

## 2024-01-27 DIAGNOSIS — M810 Age-related osteoporosis without current pathological fracture: Secondary | ICD-10-CM

## 2024-01-27 DIAGNOSIS — G894 Chronic pain syndrome: Secondary | ICD-10-CM

## 2024-01-27 DIAGNOSIS — E538 Deficiency of other specified B group vitamins: Secondary | ICD-10-CM

## 2024-01-27 DIAGNOSIS — R5383 Other fatigue: Secondary | ICD-10-CM

## 2024-01-27 NOTE — Patient Instructions (Signed)
 Ellen Holloway , Thank you for taking time to come for your Medicare Wellness Visit. I appreciate your ongoing commitment to your health goals. Please review the following plan we discussed and let me know if I can assist you in the future.   These are the goals we discussed:  Goals       Gain weight      Continue high calorie supplement shakes      Patient Stated (pt-stated)      Patient stated she would like to cook more.      Pharmacy Care Plan      CARE PLAN ENTRY (see longitudinal plan of care for additional care plan information)  Current Barriers:  Chronic Disease Management support, education, and care coordination needs related to Hyperlipidemia, Osteoporosis, and Leg Swelling   Leg Swelling BP Readings from Last 3 Encounters:  08/30/19 118/64  08/23/19 132/68  04/27/19 120/64   Pharmacist Clinical Goal(s): Over the next 90 days, patient will work with PharmD and providers to maintain BP goal <130/80 Current regimen:  Furosemide  20 mg daily prn swelling Spironolactone  25 mg daily Interventions: Continue healthy diet with low sodium and full of vegetables.  Patient self care activities - Over the next 90 days, patient will: Check BP as needed, document, and provide at future appointments Ensure daily salt intake < 2300 mg/day  Hyperlipidemia Lab Results  Component Value Date/Time   LDLCALC 64 08/23/2019 08:10 AM   Pharmacist Clinical Goal(s): Over the next 90 days, patient will work with PharmD and providers to maintain LDL goal < 70 Current regimen:  Atorvastatin  10 mg daily Interventions: Keep up the good work with healthy diet and exercise 5 days per week.  Patient self care activities - Over the next 90 days, patient will: Continue to take medications as prescribed.  Contact provider of pharmacist with any questions or concerns.  Keep regularly scheduled follow-up with fasting labs in September.   Osteoporosis Pharmacist Clinical Goal(s) Over the next 90  days, patient will work with PharmD and providers to obtain Prolia .  Current regimen:  Calcium  600 mg daily Vitamin D  with K 5000 units daily Interventions: Recommend patient continue supplements for healthy bones.  Continue consistent intake of Calcium  each day.  Work to obtain Prolia  through medical benefit.  Patient self care activities - Over the next 90 days, patient will: Continue to take Calcium  and Vitamin D  with K daily.  Continue to incorporate weight bearing exercise 3-5 times each week.   Medication management Pharmacist Clinical Goal(s): Over the next 90 days, patient will work with PharmD and providers to achieve optimal medication adherence Current pharmacy: [facility]  Interventions Comprehensive medication review performed. Continue current medication management strategy Patient self care activities - Over the next 90 days, patient will: Focus on medication adherence by continuing to store together in bag.  Take medications as prescribed Report any questions or concerns to PharmD and/or provider(s)  Initial goal documentation         This is a list of the screening recommended for you and due dates:  Health Maintenance  Topic Date Due   Breast Cancer Screening  02/09/2024   COVID-19 Vaccine (7 - 2025-26 season) 02/12/2024*   Cologuard (Stool DNA test)  01/30/2024   Screening for Lung Cancer  11/11/2024   Medicare Annual Wellness Visit  01/26/2025   DEXA scan (bone density measurement)  05/26/2025   DTaP/Tdap/Td vaccine (3 - Td or Tdap) 01/27/2032   Pneumococcal Vaccine for age over  74  Completed   Flu Shot  Completed   Hepatitis C Screening  Completed   Zoster (Shingles) Vaccine  Completed   Meningitis B Vaccine  Aged Out  *Topic was postponed. The date shown is not the original due date.    Advanced directives: yes   Next appointment: Follow up in one year for your annual wellness visit 11.13/2026 @ 0940.   Preventive Care 74 Years and Older,  Female Preventive care refers to lifestyle choices and visits with your health care provider that can promote health and wellness. What does preventive care include? A yearly physical exam. This is also called an annual well check. Dental exams once or twice a year. Routine eye exams. Ask your health care provider how often you should have your eyes checked. Personal lifestyle choices, including: Daily care of your teeth and gums. Regular physical activity. Eating a healthy diet. Avoiding tobacco and drug use. Limiting alcohol use. Practicing safe sex. Taking low-dose aspirin every day. Taking vitamin and mineral supplements as recommended by your health care provider. What happens during an annual well check? The services and screenings done by your health care provider during your annual well check will depend on your age, overall health, lifestyle risk factors, and family history of disease. Counseling  Your health care provider may ask you questions about your: Alcohol use. Tobacco use. Drug use. Emotional well-being. Home and relationship well-being. Sexual activity. Eating habits. History of falls. Memory and ability to understand (cognition). Work and work astronomer. Reproductive health. Screening  You may have the following tests or measurements: Height, weight, and BMI. Blood pressure. Lipid and cholesterol levels. These may be checked every 5 years, or more frequently if you are over 72 years old. Skin check. Lung cancer screening. You may have this screening every year starting at age 74 if you have a 30-pack-year history of smoking and currently smoke or have quit within the past 15 years. Fecal occult blood test (FOBT) of the stool. You may have this test every year starting at age 74. Flexible sigmoidoscopy or colonoscopy. You may have a sigmoidoscopy every 5 years or a colonoscopy every 10 years starting at age 74. Hepatitis C blood test. Hepatitis B blood  test. Sexually transmitted disease (STD) testing. Diabetes screening. This is done by checking your blood sugar (glucose) after you have not eaten for a while (fasting). You may have this done every 1-3 years. Bone density scan. This is done to screen for osteoporosis. You may have this done starting at age 21. Mammogram. This may be done every 1-2 years. Talk to your health care provider about how often you should have regular mammograms. Talk with your health care provider about your test results, treatment options, and if necessary, the need for more tests. Vaccines  Your health care provider may recommend certain vaccines, such as: Influenza vaccine. This is recommended every year. Tetanus, diphtheria, and acellular pertussis (Tdap, Td) vaccine. You may need a Td booster every 10 years. Zoster vaccine. You may need this after age 64. Pneumococcal 13-valent conjugate (PCV13) vaccine. One dose is recommended after age 53. Pneumococcal polysaccharide (PPSV23) vaccine. One dose is recommended after age 39. Talk to your health care provider about which screenings and vaccines you need and how often you need them. This information is not intended to replace advice given to you by your health care provider. Make sure you discuss any questions you have with your health care provider. Document Released: 03/29/2015 Document Revised: 11/20/2015  Document Reviewed: 01/01/2015 Elsevier Interactive Patient Education  2017 [employer].  Fall Prevention in the Home Falls can cause injuries. They can happen to people of all ages. There are many things you can do to make your home safe and to help prevent falls. What can I do on the outside of my home? Regularly fix the edges of walkways and driveways and fix any cracks. Remove anything that might make you trip as you walk through a door, such as a raised step or threshold. Trim any bushes or trees on the path to your home. Use bright outdoor  lighting. Clear any walking paths of anything that might make someone trip, such as rocks or tools. Regularly check to see if handrails are loose or broken. Make sure that both sides of any steps have handrails. Any raised decks and porches should have guardrails on the edges. Have any leaves, snow, or ice cleared regularly. Use sand or salt on walking paths during winter. Clean up any spills in your garage right away. This includes oil or grease spills. What can I do in the bathroom? Use night lights. Install grab bars by the toilet and in the tub and shower. Do not use towel bars as grab bars. Use non-skid mats or decals in the tub or shower. If you need to sit down in the shower, use a plastic, non-slip stool. Keep the floor dry. Clean up any water that spills on the floor as soon as it happens. Remove soap buildup in the tub or shower regularly. Attach bath mats securely with double-sided non-slip rug tape. Do not have throw rugs and other things on the floor that can make you trip. What can I do in the bedroom? Use night lights. Make sure that you have a light by your bed that is easy to reach. Do not use any sheets or blankets that are too big for your bed. They should not hang down onto the floor. Have a firm chair that has side arms. You can use this for support while you get dressed. Do not have throw rugs and other things on the floor that can make you trip. What can I do in the kitchen? Clean up any spills right away. Avoid walking on wet floors. Keep items that you use a lot in easy-to-reach places. If you need to reach something above you, use a strong step stool that has a grab bar. Keep electrical cords out of the way. Do not use floor polish or wax that makes floors slippery. If you must use wax, use non-skid floor wax. Do not have throw rugs and other things on the floor that can make you trip. What can I do with my stairs? Do not leave any items on the stairs. Make  sure that there are handrails on both sides of the stairs and use them. Fix handrails that are broken or loose. Make sure that handrails are as long as the stairways. Check any carpeting to make sure that it is firmly attached to the stairs. Fix any carpet that is loose or worn. Avoid having throw rugs at the top or bottom of the stairs. If you do have throw rugs, attach them to the floor with carpet tape. Make sure that you have a light switch at the top of the stairs and the bottom of the stairs. If you do not have them, ask someone to add them for you. What else can I do to help prevent falls? Wear shoes that:  Do not have high heels. Have rubber bottoms. Are comfortable and fit you well. Are closed at the toe. Do not wear sandals. If you use a stepladder: Make sure that it is fully opened. Do not climb a closed stepladder. Make sure that both sides of the stepladder are locked into place. Ask someone to hold it for you, if possible. Clearly mark and make sure that you can see: Any grab bars or handrails. First and last steps. Where the edge of each step is. Use tools that help you move around (mobility aids) if they are needed. These include: Canes. Walkers. Scooters. Crutches. Turn on the lights when you go into a dark area. Replace any light bulbs as soon as they burn out. Set up your furniture so you have a clear path. Avoid moving your furniture around. If any of your floors are uneven, fix them. If there are any pets around you, be aware of where they are. Review your medicines with your doctor. Some medicines can make you feel dizzy. This can increase your chance of falling. Ask your doctor what other things that you can do to help prevent falls. This information is not intended to replace advice given to you by your health care provider. Make sure you discuss any questions you have with your health care provider. Document Released: 12/27/2008 Document Revised: 08/08/2015  Document Reviewed: 04/06/2014 Elsevier Interactive Patient Education  2017 [employer].

## 2024-01-27 NOTE — Progress Notes (Signed)
 Chief Complaint  Patient presents with   Annual Exam     Subjective:   Ellen Holloway is a 73 y.o. female who presents for a Medicare Annual Wellness Visit.  Allergies (verified) Sulfamethoxazole, Aspirin, and Sulfa antibiotics   History: Past Medical History:  Diagnosis Date   Acquired absence of both cervix and uterus    Age-related osteoporosis with current pathological fracture, unspecified site, initial encounter for fracture    Allergy    Asthma    Barrett's esophagus with low grade dysplasia    Cataract 2016   They were removed   Chronic obstructive pulmonary disease (COPD) (HCC)    GERD (gastroesophageal reflux disease)    Major depressive disorder, recurrent severe without psychotic features (HCC)    Mixed hyperlipidemia    Obstructive sleep apnea (adult) (pediatric)    Other obesity due to excess calories    Other pulmonary embolism without acute cor pulmonale (HCC)    Other specified anxiety disorders    Oxygen  deficiency 2016   Sleep related leg cramps    Telogen effluvium    Urticaria    Past Surgical History:  Procedure Laterality Date   ABDOMINAL HYSTERECTOMY  1980   partial   BREAST BIOPSY Left 02/12/2022   US  LT BREAST BX W LOC DEV 1ST LESION IMG BX SPEC US  GUIDE 02/12/2022 [facility]   CESAREAN SECTION  06/24/1978   EYE SURGERY     Cataracts   right wrist 1985     SPINE SURGERY     Had lower back, herinated disk   Family History  Problem Relation Age of Onset   Obesity Mother    Varicose Veins Mother    Heart attack Father    Heart disease Father    Allergic rhinitis Sister    Thyroid  cancer Daughter    Asthma Brother    Renal Disease Brother    Kidney disease Brother    Breast cancer Neg Hx    Social History   Occupational History   Not on file  Tobacco Use   Smoking status: Former    Current packs/day: 0.00    Average packs/day: 0.5 packs/day for 55.0 years (27.5 ttl pk-yrs)    Types: Cigarettes, E-cigarettes     Start date: 1964    Quit date: 2019    Years since quitting: 6.8   Smokeless tobacco: Never   Tobacco comments:    Patient used to smoke up to 2 PPD.  Substance and Sexual Activity   Alcohol use: Yes    Alcohol/week: 2.0 standard drinks of alcohol    Types: 1 Glasses of wine, 1 Cans of beer per week    Comment: When we have pizza or gi out for nice dinner, never together   Drug use: Never   Sexual activity: Not Currently    Birth control/protection: Other-see comments, None    Comment: I am 75,   Tobacco Counseling Counseling given: Not Answered Tobacco comments: Patient used to smoke up to 2 PPD.  SDOH Screenings   Food Insecurity: No Food Insecurity (01/27/2024)  Housing: Unknown (01/27/2024)  Transportation Needs: No Transportation Needs (01/27/2024)  Utilities: Not At Risk (01/27/2024)  Alcohol Screen: Low Risk  (01/27/2024)  Depression (PHQ2-9): Low Risk  (01/27/2024)  Financial Resource Strain: Medium Risk (01/27/2024)  Physical Activity: Insufficiently Active (01/27/2024)  Social Connections: Moderately Isolated (01/27/2024)  Stress: Stress Concern Present (01/27/2024)  Tobacco Use: Medium Risk (01/27/2024)  Health Literacy: Adequate Health Literacy (01/27/2024)   See  flowsheets for full screening details  Depression Screen PHQ 2 & 9 Depression Scale- Over the past 2 weeks, how often have you been bothered by any of the following problems? Little interest or pleasure in doing things: 0 Feeling down, depressed, or hopeless (PHQ Adolescent also includes...irritable): 0 PHQ-2 Total Score: 0 Trouble falling or staying asleep, or sleeping too much: 0 Feeling tired or having little energy: 3 Poor appetite or overeating (PHQ Adolescent also includes...weight loss): 1 Feeling bad about yourself - or that you are a failure or have let yourself or your family down: 0 Trouble concentrating on things, such as reading the newspaper or watching television (PHQ Adolescent also  includes...like school work): 0 Moving or speaking so slowly that other people could have noticed. Or the opposite - being so fidgety or restless that you have been moving around a lot more than usual: 0 Thoughts that you would be better off dead, or of hurting yourself in some way: 0 PHQ-9 Total Score: 4 If you checked off any problems, how difficult have these problems made it for you to do your work, take care of things at home, or get along with other people?: Not difficult at all  Depression Treatment Depression Interventions/Treatment : Currently on Treatment     Goals Addressed               This Visit's Progress     Patient Stated (pt-stated)        Patient stated she would like to cook more.       Visit info / Clinical Intake: Medicare Wellness Visit Type:: Subsequent Annual Wellness Visit Persons participating in visit:: patient Medicare Wellness Visit Mode:: In-person (required for WTM) Information given by:: patient Interpreter Needed?: No Pre-visit prep was completed: no AWV questionnaire completed by patient prior to visit?: no Living arrangements:: lives with spouse/significant other Patient's Overall Health Status Rating: very good Typical amount of pain: (!) a lot (back pain.) Does pain affect daily life?: no Are you currently prescribed opioids?: (!) yes  Dietary Habits and Nutritional Risks How many meals a day?: 4 Eats fruit and vegetables daily?: yes Most meals are obtained by: preparing own meals In the last 2 weeks, have you had any of the following?: none Diabetic:: no  Functional Status Activities of Daily Living (to include ambulation/medication): Independent Ambulation: Independent Medication Administration: Independent Home Management: Independent Manage your own finances?: yes Primary transportation is: driving Concerns about vision?: no *vision screening is required for WTM* Concerns about hearing?: no  Fall Screening Falls in the  past year?: 1 Number of falls in past year: 0 Was there an injury with Fall?: 0 Fall Risk Category Calculator: 1 Patient Fall Risk Level: Low Fall Risk  Fall Risk Patient at Risk for Falls Due to: History of fall(s) Fall risk Follow up: Falls evaluation completed  Home and Transportation Safety: All rugs have non-skid backing?: yes All stairs or steps have railings?: yes Grab bars in the bathtub or shower?: (!) no Have non-skid surface in bathtub or shower?: yes Good home lighting?: yes Regular seat belt use?: yes Hospital stays in the last year:: (!) no; yes How many hospital stays:: 3 Reason: sickness, bronch.  Cognitive Assessment Difficulty concentrating, remembering, or making decisions? : no Will 6CIT or Mini Cog be Completed: yes What year is it?: 0 points What month is it?: 0 points Give patient an address phrase to remember (5 components): [address] [place] About what time is it?: 0  points Count backwards from 20 to 1: 0 points Say the months of the year in reverse: 0 points Repeat the address phrase from earlier: 0 points 6 CIT Score: 0 points  Advance Directives (For Healthcare) Does Patient Have a Medical Advance Directive?: Yes Type of Advance Directive: Healthcare Power of [place]; Living will; Out of facility DNR (pink MOST or yellow form) Copy of Healthcare Power of Attorney in Chart?: No - copy requested Copy of Living Will in Chart?: No - copy requested Out of facility DNR (pink MOST or yellow form) in Chart? (Ambulatory ONLY): No - copy requested  Reviewed/Updated  Reviewed/Updated: Reviewed All (Medical, Surgical, Family, Medications, Allergies, Care Teams, Patient Goals)        Objective:    Today's Vitals   01/27/24 0801  BP: (!) 142/82  Pulse: 92  Temp: 98.2 F (36.8 C)  SpO2: 98%  Weight: 102 lb (46.3 kg)  Height: 5' 3 (1.6 m)   Body mass index is 18.07 kg/m.  Current Medications (verified) Outpatient Encounter Medications  as of 01/27/2024  Medication Sig   albuterol  (VENTOLIN  HFA) 108 (90 Base) MCG/ACT inhaler Inhale 2 puffs into the lungs every 6 (six) hours as needed for wheezing or shortness of breath.   atorvastatin  (LIPITOR) 10 MG tablet TAKE 1 TABLET BY MOUTH EVERY DAY   calcium  carbonate (OSCAL) 1500 (600 Ca) MG TABS tablet Take 600 mg of elemental calcium  by mouth daily.   clonazePAM  (KLONOPIN ) 1 MG tablet Take 1 tablet (1 mg total) by mouth daily as needed for anxiety.   cyclobenzaprine  (FLEXERIL ) 10 MG tablet TAKE 1 TABLET BY MOUTH EVERYDAY AT BEDTIME   famotidine  (PEPCID ) 40 MG tablet TAKE 1 TABLET BY MOUTH EVERYDAY AT BEDTIME   feeding supplement (BOOST HIGH PROTEIN) LIQD Take 1 Container by mouth in the morning and at bedtime.   furosemide  (LASIX ) 20 MG tablet TAKE 1 TABLET (20 MG TOTAL) BY MOUTH DAILY AS NEEDED FOR SWELLING   oxyCODONE-acetaminophen (PERCOCET) 10-325 MG tablet Take 1 tablet by mouth every 6 (six) hours.   OXYGEN  Inhale 3 L into the lungs. 2 L at night.   pramipexole  (MIRAPEX ) 1 MG tablet TAKE 1 TABLET BY MOUTH EVERYDAY AT BEDTIME   promethazine  (PHENERGAN ) 25 MG tablet Take 1 tablet (25 mg total) by mouth every 6 (six) hours as needed for nausea or vomiting.   sertraline  (ZOLOFT ) 100 MG tablet TAKE 2 TABLETS BY MOUTH EVERY DAY   VITAMIN D -VITAMIN K PO Take 1 tablet by mouth daily.   Facility-Administered Encounter Medications as of 01/27/2024  Medication   denosumab  (PROLIA ) injection 60 mg   Hearing/Vision screen No results found. Immunizations and Health Maintenance Health Maintenance  Topic Date Due   Mammogram  02/09/2024   COVID-19 Vaccine (7 - 2025-26 season) 02/12/2024 (Originally 11/15/2023)   Fecal DNA (Cologuard)  01/30/2024   Lung Cancer Screening  11/11/2024   Medicare Annual Wellness (AWV)  01/26/2025   DEXA SCAN  05/26/2025   DTaP/Tdap/Td (3 - Td or Tdap) 01/27/2032   Pneumococcal Vaccine: 50+ Years  Completed   Influenza Vaccine  Completed   Hepatitis C  Screening  Completed   Zoster Vaccines- Shingrix  Completed   Meningococcal B Vaccine  Aged Out        Assessment/Plan:  This is a routine wellness examination for Ellen Holloway.  Patient Care Team: Sherre Clapper, MD as PCP - General (Internal Medicine) Misenheimer, Evalene, MD as Consulting Physician (Gastroenterology) Chodri, Tanvir, MD as Referring Physician (Pulmonary Disease)  Kozlow, Camellia PARAS, MD as Consulting Physician (Allergy and Immunology) Rudy Adine HERO, PA-C (Orthopedic Surgery)  I have personally reviewed and noted the following in the patient's chart:   Medical and social history Use of alcohol, tobacco or illicit drugs  Current medications and supplements including opioid prescriptions. Functional ability and status Nutritional status Physical activity Advanced directives List of other physicians Hospitalizations, surgeries, and ER visits in previous 12 months Vitals Screenings to include cognitive, depression, and falls Referrals and appointments  No orders of the defined types were placed in this encounter.  In addition, I have reviewed and discussed with patient certain preventive protocols, quality metrics, and best practice recommendations. A written personalized care plan for preventive services as well as general preventive health recommendations were provided to patient.   [place] Mailman, CMA   01/27/2024   Return in 1 year (on 01/26/2025).  After Visit Summary: (In Person-Printed) AVS printed and given to the patient  Nurse Notes: I spent 45 minutes with the patient, she has no questions or concerns at this time. Patient is super nice and sweet.

## 2024-01-30 DIAGNOSIS — R5383 Other fatigue: Secondary | ICD-10-CM | POA: Insufficient documentation

## 2024-01-30 DIAGNOSIS — E538 Deficiency of other specified B group vitamins: Secondary | ICD-10-CM | POA: Insufficient documentation

## 2024-01-30 NOTE — Assessment & Plan Note (Addendum)
 Managed with pramipexole  1 mg at bedtime.

## 2024-01-30 NOTE — Assessment & Plan Note (Addendum)
 Check levels Orders:   Vitamin B12   Methylmalonic acid, serum

## 2024-01-30 NOTE — Assessment & Plan Note (Addendum)
 Check labs.   Orders:   CBC with Differential/Platelet   Comprehensive metabolic panel with GFR

## 2024-01-30 NOTE — Patient Instructions (Signed)
  VISIT SUMMARY: Today, we addressed your persistent fatigue and daytime sleepiness, reviewed your current medications, and completed your annual wellness visit. We also discussed your ongoing management for osteoporosis, depression, hyperlipidemia, chronic bronchitis, restless legs syndrome, and chronic pain.  YOUR PLAN: FATIGUE AND DAYTIME SOMNOLENCE: You have been experiencing persistent fatigue and daytime sleepiness despite getting what seems to be enough sleep at night. -We rechecked your blood pressure and ordered blood work to investigate the cause of your fatigue.  OSTEOPOROSIS: Your osteoporosis is being managed with Prolia  and calcium  supplementation. -Continue taking Prolia  and calcium  as prescribed.  DEPRESSION: Your depression is being managed with sertraline  200 mg daily. -Continue taking sertraline  as prescribed. You did not report any significant depressive symptoms today.  MIXED HYPERLIPIDEMIA: Your high cholesterol is being managed with Lipitor 10 mg daily. -Continue taking Lipitor as prescribed.  CHRONIC BRONCHITIS: Your chronic bronchitis is being managed with an albuterol  inhaler and nighttime oxygen  therapy. -Continue using your albuterol  inhaler as needed and your nighttime oxygen  therapy at 3 liters per minute.  RESTLESS LEGS SYNDROME: Your restless legs syndrome is being managed with pramipexole  1 mg at bedtime. -Continue taking pramipexole  as prescribed.  CHRONIC PAIN: Your chronic pain is being managed with Percocet 10/325 mg every 6 hours as needed. -Continue taking Percocet as prescribed. If you have trouble getting your medication refills, please walk in to the pain clinic if you cannot get through by phone.                      Contains text generated by [employer]bridge.                                 Contains text generated by Abridge.

## 2024-01-30 NOTE — Assessment & Plan Note (Addendum)
 Managed with Percocet 10/325 mg every 6 hours as needed. Issues with medication refills due to changes at the pain clinic. - Advised to walk in to the pain clinic if unable to get through by phone.

## 2024-01-31 ENCOUNTER — Other Ambulatory Visit: Payer: Self-pay | Admitting: Family Medicine

## 2024-01-31 ENCOUNTER — Other Ambulatory Visit

## 2024-01-31 DIAGNOSIS — E538 Deficiency of other specified B group vitamins: Secondary | ICD-10-CM

## 2024-01-31 DIAGNOSIS — E441 Mild protein-calorie malnutrition: Secondary | ICD-10-CM

## 2024-02-01 ENCOUNTER — Ambulatory Visit: Payer: Self-pay | Admitting: Family Medicine

## 2024-02-01 LAB — COMPREHENSIVE METABOLIC PANEL WITH GFR
ALT: 17 IU/L (ref 0–32)
AST: 24 IU/L (ref 0–40)
Albumin: 4.2 g/dL (ref 3.8–4.8)
Alkaline Phosphatase: 59 IU/L (ref 49–135)
BUN/Creatinine Ratio: 14 (ref 12–28)
BUN: 10 mg/dL (ref 8–27)
Bilirubin Total: 0.4 mg/dL (ref 0.0–1.2)
CO2: 26 mmol/L (ref 20–29)
Calcium: 9.5 mg/dL (ref 8.7–10.3)
Chloride: 101 mmol/L (ref 96–106)
Creatinine, Ser: 0.7 mg/dL (ref 0.57–1.00)
Globulin, Total: 2.2 g/dL (ref 1.5–4.5)
Glucose: 99 mg/dL (ref 70–99)
Potassium: 4.7 mmol/L (ref 3.5–5.2)
Sodium: 139 mmol/L (ref 134–144)
Total Protein: 6.4 g/dL (ref 6.0–8.5)
eGFR: 91 mL/min/1.73 (ref >59)

## 2024-02-01 LAB — CBC WITH DIFFERENTIAL/PLATELET
Basophils Absolute: 0 x10E3/uL (ref 0.0–0.2)
Basos: 0 %
EOS (ABSOLUTE): 0.1 x10E3/uL (ref 0.0–0.4)
Eos: 1 %
Hematocrit: 40.1 % (ref 34.0–46.6)
Hemoglobin: 13.4 g/dL (ref 11.1–15.9)
Immature Grans (Abs): 0 x10E3/uL (ref 0.0–0.1)
Immature Granulocytes: 0 %
Lymphocytes Absolute: 2.1 x10E3/uL (ref 0.7–3.1)
Lymphs: 25 %
MCH: 32.5 pg (ref 26.6–33.0)
MCHC: 33.4 g/dL (ref 31.5–35.7)
MCV: 97 fL (ref 79–97)
Monocytes Absolute: 0.7 x10E3/uL (ref 0.1–0.9)
Monocytes: 8 %
Neutrophils Absolute: 5.4 x10E3/uL (ref 1.4–7.0)
Neutrophils: 66 %
Platelets: 216 x10E3/uL (ref 150–450)
RBC: 4.12 x10E6/uL (ref 3.77–5.28)
RDW: 12.7 % (ref 11.7–15.4)
WBC: 8.3 x10E3/uL (ref 3.4–10.8)

## 2024-02-01 LAB — VITAMIN B12: Vitamin B-12: 449 pg/mL (ref 232–1245)

## 2024-02-03 ENCOUNTER — Ambulatory Visit: Payer: Self-pay

## 2024-02-03 ENCOUNTER — Other Ambulatory Visit: Payer: Self-pay

## 2024-02-03 NOTE — Telephone Encounter (Signed)
 FYI Only or Action Required?: FYI only for provider: appointment scheduled on 02/04/24.  Patient was last seen in primary care on 01/27/2024 by Sherre Clapper, MD.  Called Nurse Triage reporting Dysuria and Urinary Frequency.  Symptoms began several days ago.  Interventions attempted: Nothing.  Symptoms are: gradually worsening.  Triage Disposition: See Physician Within 24 Hours  Patient/caregiver understands and will follow disposition?: Yes Reason for Disposition  Age > 50 years  Answer Assessment - Initial Assessment Questions 1. SEVERITY: How bad is the pain?  (e.g., Scale 1-10; mild, moderate, or severe)     Severe  2. FREQUENCY: How many times have you had painful urination today?      Feeling the urge every 5-10 minutes, burning every time  3. PATTERN: Is pain present every time you urinate or just sometimes?      Every time  4. ONSET: When did the painful urination start?      Few days ago  5. FEVER: Do you have a fever? If Yes, ask: What is your temperature, how was it measured, and when did it start?     Does not feel like one is present  6. PAST UTI: Have you had a urine infection before? If Yes, ask: When was the last time? and What happened that time?      Years ago  7. CAUSE: What do you think is causing the painful urination?  (e.g., UTI, scratch, Herpes sore)     UTI  8. OTHER SYMPTOMS: Do you have any other symptoms? (e.g., blood in urine, flank pain, genital sores, urgency, vaginal discharge)     Urgency, throat is sore  Protocols used: Urination Pain - Female-A-AH

## 2024-02-04 ENCOUNTER — Encounter: Payer: Self-pay | Admitting: Family Medicine

## 2024-02-04 ENCOUNTER — Ambulatory Visit (INDEPENDENT_AMBULATORY_CARE_PROVIDER_SITE_OTHER): Admitting: Family Medicine

## 2024-02-04 VITALS — BP 118/68 | HR 67 | Temp 97.4°F | Ht 63.0 in | Wt 103.4 lb

## 2024-02-04 DIAGNOSIS — J028 Acute pharyngitis due to other specified organisms: Secondary | ICD-10-CM

## 2024-02-04 DIAGNOSIS — R300 Dysuria: Secondary | ICD-10-CM

## 2024-02-04 LAB — POCT URINALYSIS DIP (CLINITEK)
Bilirubin, UA: NEGATIVE
Blood, UA: NEGATIVE
Glucose, UA: NEGATIVE mg/dL
Ketones, POC UA: NEGATIVE mg/dL
Leukocytes, UA: NEGATIVE
Nitrite, UA: NEGATIVE
POC PROTEIN,UA: NEGATIVE
Spec Grav, UA: 1.01 (ref 1.010–1.025)
Urobilinogen, UA: 0.2 U/dL
pH, UA: 6 (ref 5.0–8.0)

## 2024-02-04 MED ORDER — AMOXICILLIN-POT CLAVULANATE 875-125 MG PO TABS: 1.0000 | ORAL_TABLET | Freq: Two times a day (BID) | ORAL | 0 refills | Status: DC

## 2024-02-04 NOTE — Progress Notes (Signed)
 Acute Office Visit  Subjective:    Patient ID: Ellen Holloway, female    DOB: February 14, 1951, 73 y.o.   MRN: [id number]  Chief Complaint  Patient presents with   Dysuria    Discussed the use of AI scribe software for clinical note transcription with the patient, who gave verbal consent to proceed.  The patient is a 73 year old white female who presents with polyuria and dysuria.  She is also having a lot of nasal congestion and drainage.  Patient has COPD and is having some coughing.  The URI symptoms have been going on for 4 days.  Denies shortness of breath.  She is drinking more because her throat is dry and is bothering her.  Review of systems No fevers, chills, sweats. No earaches    Past Medical History:  Diagnosis Date   Acquired absence of both cervix and uterus    Age-related osteoporosis with current pathological fracture, unspecified site, initial encounter for fracture    Allergy    Asthma    Barrett's esophagus with low grade dysplasia    Cataract 2016   They were removed   Chronic obstructive pulmonary disease (COPD) (HCC)    GERD (gastroesophageal reflux disease)    Major depressive disorder, recurrent severe without psychotic features (HCC)    Mixed hyperlipidemia    Obstructive sleep apnea (adult) (pediatric)    Other obesity due to excess calories    Other pulmonary embolism without acute cor pulmonale (HCC)    Other specified anxiety disorders    Oxygen  deficiency 2016   Sleep related leg cramps    Telogen effluvium    Urticaria     Past Surgical History:  Procedure Laterality Date   ABDOMINAL HYSTERECTOMY  1980   partial   BREAST BIOPSY Left 02/12/2022   US  LT BREAST BX W LOC DEV 1ST LESION IMG BX SPEC US  GUIDE 02/12/2022 [facility]   CESAREAN SECTION  06/24/1978   EYE SURGERY     Cataracts   right wrist 1985     SPINE SURGERY     Had lower back, herinated disk    Family History  Problem Relation Age of Onset   Obesity Mother     Varicose Veins Mother    Heart attack Father    Heart disease Father    Allergic rhinitis Sister    Thyroid  cancer Daughter    Asthma Brother    Renal Disease Brother    Kidney disease Brother    Breast cancer Neg Hx     Social History   Socioeconomic History   Marital status: Married    Spouse name: Francis   Number of children: 2   Years of education: Not on file   Highest education level: Bachelor's degree (e.g., BA, AB, BS)  Occupational History   Not on file  Tobacco Use   Smoking status: Former    Current packs/day: 0.00    Average packs/day: 0.5 packs/day for 55.0 years (27.5 ttl pk-yrs)    Types: Cigarettes, E-cigarettes    Start date: 1964    Quit date: 2019    Years since quitting: 6.9   Smokeless tobacco: Never   Tobacco comments:    Patient used to smoke up to 2 PPD.  Substance and Sexual Activity   Alcohol use: Yes    Alcohol/week: 2.0 standard drinks of alcohol    Types: 1 Glasses of wine, 1 Cans of beer per week    Comment: When we have pizza or  gi out for nice dinner, never together   Drug use: Never   Sexual activity: Not Currently    Birth control/protection: Other-see comments, None    Comment: I am 80,  Other Topics Concern   Not on file  Social History Narrative   Daughter - lives in [place], [place] - lives out of state - both are very successful.   Social Drivers of Health   Financial Resource Strain: Medium Risk (01/27/2024)   Overall Financial Resource Strain (CARDIA)    Difficulty of Paying Living Expenses: Somewhat hard  Food Insecurity: No Food Insecurity (01/27/2024)   Hunger Vital Sign    Worried About Running Out of Food in the Last Year: Never true    Ran Out of Food in the Last Year: Never true  Transportation Needs: No Transportation Needs (01/27/2024)   PRAPARE - Administrator, Civil Service (Medical): No    Lack of Transportation (Non-Medical): No  Physical Activity: Insufficiently Active (01/27/2024)   Exercise  Vital Sign    Days of Exercise per Week: 4 days    Minutes of Exercise per Session: 10 min  Stress: Stress Concern Present (01/27/2024)   [employer] of Occupational Health - Occupational Stress Questionnaire    Feeling of Stress: To some extent  Social Connections: Moderately Isolated (01/27/2024)   Social Connection and Isolation Panel    Frequency of Communication with Friends and Family: More than three times a week    Frequency of Social Gatherings with Friends and Family: Once a week    Attends Religious Services: Never    Database Administrator or Organizations: No    Attends Banker Meetings: Never    Marital Status: Married  Catering Manager Violence: Not At Risk (01/27/2024)   Humiliation, Afraid, Rape, and Kick questionnaire    Fear of Current or Ex-Partner: No    Emotionally Abused: No    Physically Abused: No    Sexually Abused: No    Outpatient Medications Prior to Visit  Medication Sig Dispense Refill   albuterol  (VENTOLIN  HFA) 108 (90 Base) MCG/ACT inhaler Inhale 2 puffs into the lungs every 6 (six) hours as needed for wheezing or shortness of breath. 24 g 3   atorvastatin  (LIPITOR) 10 MG tablet TAKE 1 TABLET BY MOUTH EVERY DAY 90 tablet 1   calcium  carbonate (OSCAL) 1500 (600 Ca) MG TABS tablet Take 600 mg of elemental calcium  by mouth daily.     clonazePAM  (KLONOPIN ) 1 MG tablet Take 1 tablet (1 mg total) by mouth daily as needed for anxiety. 30 tablet 1   famotidine  (PEPCID ) 40 MG tablet TAKE 1 TABLET BY MOUTH EVERYDAY AT BEDTIME 90 tablet 2   feeding supplement (BOOST HIGH PROTEIN) LIQD Take 1 Container by mouth in the morning and at bedtime.     furosemide  (LASIX ) 20 MG tablet TAKE 1 TABLET (20 MG TOTAL) BY MOUTH DAILY AS NEEDED FOR SWELLING 90 tablet 0   oxyCODONE-acetaminophen (PERCOCET) 10-325 MG tablet Take 1 tablet by mouth every 6 (six) hours.     OXYGEN  Inhale 3 L into the lungs. 2 L at night.     pramipexole  (MIRAPEX ) 1 MG tablet TAKE 1  TABLET BY MOUTH EVERYDAY AT BEDTIME 90 tablet 1   promethazine  (PHENERGAN ) 25 MG tablet Take 1 tablet (25 mg total) by mouth every 6 (six) hours as needed for nausea or vomiting. 30 tablet 1   sertraline  (ZOLOFT ) 100 MG tablet TAKE 2 TABLETS BY MOUTH EVERY  DAY 180 tablet 1   VITAMIN D -VITAMIN K PO Take 1 tablet by mouth daily.     cyclobenzaprine  (FLEXERIL ) 10 MG tablet TAKE 1 TABLET BY MOUTH EVERYDAY AT BEDTIME 90 tablet 1   Facility-Administered Medications Prior to Visit  Medication Dose Route Frequency Provider Last Rate Last Admin   denosumab  (PROLIA ) injection 60 mg  60 mg Subcutaneous Q6 months Heber Hoog, MD   60 mg at 11/30/22 0941    Allergies  Allergen Reactions   Sulfamethoxazole Anaphylaxis   Aspirin Other (See Comments)   Sulfa Antibiotics Hives    Review of Systems  Constitutional:  Negative for chills, fatigue and fever.  HENT:  Negative for congestion, ear pain and sinus pain.   Respiratory:  Negative for cough and shortness of breath.   Cardiovascular:  Negative for chest pain.  Gastrointestinal:  Negative for abdominal pain, constipation, diarrhea, nausea and vomiting.  Genitourinary:  Positive for dysuria and frequency.  Musculoskeletal:  Negative for myalgias.  Neurological:  Negative for headaches.       Objective:        02/04/2024    8:09 AM 01/27/2024    8:01 AM 01/27/2024    7:42 AM  Vitals with BMI  Height 5' 3 5' 3 5' 3  Weight 103 lbs 6 oz 102 lbs 102 lbs  BMI 18.32 18.07 18.07  Systolic 118 142 871  Diastolic 68 82 70  Pulse 67 92 83    No data found.   Physical Exam Vitals reviewed.  Constitutional:      Appearance: Normal appearance.  HENT:     Right Ear: Tympanic membrane, ear canal and external ear normal.     Left Ear: Tympanic membrane, ear canal and external ear normal.     Nose: Congestion present.     Mouth/Throat:     Pharynx: Posterior oropharyngeal erythema present. No oropharyngeal exudate.     Comments: Yellow  drainage in the posterior pharynx.  No erythema or edema. Cardiovascular:     Rate and Rhythm: Normal rate and regular rhythm.     Heart sounds: Normal heart sounds. No murmur heard. Pulmonary:     Effort: Pulmonary effort is normal. No respiratory distress.     Breath sounds: Normal breath sounds.  Abdominal:     Palpations: Abdomen is soft.     Tenderness: There is no abdominal tenderness.  Lymphadenopathy:     Cervical: Cervical adenopathy (tender) present.  Neurological:     Mental Status: She is alert and oriented to person, place, and time.  Psychiatric:        Mood and Affect: Mood normal.        Behavior: Behavior normal.     Health Maintenance Due  Topic Date Due   Fecal DNA (Cologuard)  01/30/2024   Mammogram  02/09/2024    There are no preventive care reminders to display for this patient.   Lab Results  Component Value Date   TSH 0.940 10/19/2023   Lab Results  Component Value Date   WBC 8.3 01/31/2024   HGB 13.4 01/31/2024   HCT 40.1 01/31/2024   MCV 97 01/31/2024   PLT 216 01/31/2024   Lab Results  Component Value Date   NA 139 01/31/2024   K 4.7 01/31/2024   CO2 26 01/31/2024   GLUCOSE 99 01/31/2024   BUN 10 01/31/2024   CREATININE 0.70 01/31/2024   BILITOT 0.4 01/31/2024   ALKPHOS 59 01/31/2024   AST 24 01/31/2024  ALT 17 01/31/2024   PROT 6.4 01/31/2024   ALBUMIN 4.2 01/31/2024   CALCIUM  9.5 01/31/2024   EGFR 91 01/31/2024   Lab Results  Component Value Date   CHOL 180 10/19/2023   Lab Results  Component Value Date   HDL 102 10/19/2023   Lab Results  Component Value Date   LDLCALC 67 10/19/2023   Lab Results  Component Value Date   TRIG 56 10/19/2023   Lab Results  Component Value Date   CHOLHDL 1.8 10/19/2023   No results found for: HGBA1C      Results for orders placed or performed in visit on 02/04/24  POCT URINALYSIS DIP (CLINITEK)   Collection Time: 02/04/24  8:19 AM  Result Value Ref Range   Color, UA  yellow yellow   Clarity, UA clear clear   Glucose, UA negative negative mg/dL   Bilirubin, UA negative negative   Ketones, POC UA negative negative mg/dL   Spec Grav, UA 8.989 8.989 - 1.025   Blood, UA negative negative   pH, UA 6.0 5.0 - 8.0   POC PROTEIN,UA negative negative, trace   Urobilinogen, UA 0.2 0.2 or 1.0 E.U./dL   Nitrite, UA Negative Negative   Leukocytes, UA Negative Negative  Urine Culture   Collection Time: 02/04/24 10:42 AM   Specimen: Urine   UR  Result Value Ref Range   Urine Culture, Routine Final report    Organism ID, Bacteria Comment      Assessment & Plan:   Assessment & Plan Dysuria Urine negative.  Orders:   POCT URINALYSIS DIP (CLINITEK)   amoxicillin -clavulanate (AUGMENTIN ) 875-125 MG tablet; Take 1 tablet by mouth 2 (two) times daily.   Urine Culture  Acute pharyngitis due to other specified organisms Augmentin  sent.  Orders:   amoxicillin -clavulanate (AUGMENTIN ) 875-125 MG tablet; Take 1 tablet by mouth 2 (two) times daily.     Body mass index is 18.32 kg/m..  Meds ordered this encounter  Medications   amoxicillin -clavulanate (AUGMENTIN ) 875-125 MG tablet    Sig: Take 1 tablet by mouth 2 (two) times daily.    Dispense:  20 tablet    Refill:  0    Orders Placed This Encounter  Procedures   Urine Culture   POCT URINALYSIS DIP (CLINITEK)     Follow-up: Return if symptoms worsen or fail to improve.  An After Visit Summary was printed and given to the patient.  Abigail Free, MD [facility] [phone]

## 2024-02-07 ENCOUNTER — Ambulatory Visit: Payer: Self-pay | Admitting: Family Medicine

## 2024-02-07 LAB — URINE CULTURE

## 2024-02-12 NOTE — Assessment & Plan Note (Signed)
 Urine negative.  Orders:   POCT URINALYSIS DIP (CLINITEK)   amoxicillin -clavulanate (AUGMENTIN ) 875-125 MG tablet; Take 1 tablet by mouth 2 (two) times daily.   Urine Culture

## 2024-02-12 NOTE — Assessment & Plan Note (Signed)
 Augmentin  sent.  Orders:   amoxicillin -clavulanate (AUGMENTIN ) 875-125 MG tablet; Take 1 tablet by mouth 2 (two) times daily.

## 2024-02-20 ENCOUNTER — Encounter: Payer: Self-pay | Admitting: Family Medicine

## 2024-02-25 ENCOUNTER — Telehealth: Admitting: Family Medicine

## 2024-02-25 ENCOUNTER — Ambulatory Visit: Payer: Self-pay

## 2024-02-25 VITALS — Ht 63.0 in | Wt 103.0 lb

## 2024-02-25 DIAGNOSIS — J441 Chronic obstructive pulmonary disease with (acute) exacerbation: Secondary | ICD-10-CM | POA: Diagnosis not present

## 2024-02-25 MED ORDER — AMOXICILLIN-POT CLAVULANATE 875-125 MG PO TABS: 1.0000 | ORAL_TABLET | Freq: Two times a day (BID) | ORAL | 0 refills | Status: AC

## 2024-02-25 MED ORDER — PREDNISONE 50 MG PO TABS: 50.0000 mg | ORAL_TABLET | Freq: Every day | ORAL | 0 refills | Status: AC

## 2024-02-25 MED ORDER — HYDROCOD POLI-CHLORPHE POLI ER 10-8 MG/5ML PO SUER: 5.0000 mL | Freq: Two times a day (BID) | ORAL | 0 refills | Status: AC | PRN

## 2024-02-25 NOTE — Telephone Encounter (Signed)
 FYI Only or Action Required?: pt is requesting that chlorpheniramine-HYDROcodone  (TUSSIONEX) 10-8 MG/5ML  and Augmentin  be called into [facility] on [address]  Patient was last seen in primary care on 02/04/2024 by Sherre Clapper, MD.  Called Nurse Triage reporting Cough. - mostly dry and barky - at times brings up thick green phlegm - LGF   Symptoms began several days ago.  Interventions attempted: nebulizer inhaler.  Symptoms are: gradually worsening.  Triage Disposition: See HCP Within 4 Hours (Or PCP Triage)  Patient/caregiver understands and will follow disposition?: No appts wants medications called in  - scheduled an appt for Monday.                            Copied from CRM #[id number]. Topic: Clinical - Red Word Triage >> Feb 25, 2024  8:47 AM Willma R wrote: Red Word that prompted transfer to Nurse Triage: Patient states she thinks she had bronchitis, has a barking cough, congestion with discolored mucous and pain in her head. Reason for Disposition  [1] MILD difficulty breathing (e.g., minimal/no SOB at rest, SOB with walking, pulse < 100) AND [2] still present when not coughing  Answer Assessment - Initial Assessment Questions 1. ONSET: When did the cough begin?      First a week 2. SEVERITY: How bad is the cough today?      moderate 3. SPUTUM: Describe the color of your sputum (e.g., none, dry cough; clear, white, yellow, green)     Green thick 4. HEMOPTYSIS: Are you coughing up any blood? If Yes, ask: How much? (e.g., flecks, streaks, tablespoons, etc.)     no 5. DIFFICULTY BREATHING: Are you having difficulty breathing? If Yes, ask: How bad is it? (e.g., mild, moderate, severe)      mild 6. FEVER: Do you have a fever? If Yes, ask: What is your temperature, how was it measured, and when did it start?     LGF 7. CARDIAC HISTORY: Do you have any history of heart disease? (e.g., heart attack, congestive heart failure)      no 8. LUNG  HISTORY: Do you have any history of lung disease?  (e.g., pulmonary embolus, asthma, emphysema)     COPD and pneumonia  10. OTHER SYMPTOMS: Do you have any other symptoms? (e.g., runny nose, wheezing, chest pain)       Sinus congestion  Protocols used: Cough - Acute Productive-A-AH

## 2024-02-25 NOTE — Progress Notes (Addendum)
 Virtual Visit via Telephone Note   This visit type was conducted secondary to patient preference or provider felt to be most appropriate for this patient at this time.  All issues noted in this document were discussed and addressed.  No physical exam could be performed with this format.  Patient verbally consented to a telehealth visit.   Date:  02/27/2024   ID:  Ellen Holloway, DOB Mar 09, 1951, MRN [id number]  Patient Location: Home Provider Location: Office/Clinic  PCP:  Sherre Clapper, MD   History of Present Illness:    Chief Complaint  Patient presents with   Cough   Patient presents with cough, shortness of breath, low grade fever and headaches x 3 days. No nasal congestion.  Using albuterol  inhaler 2-3 times per day.    Past Medical History:  Diagnosis Date   Acquired absence of both cervix and uterus    Age-related osteoporosis with current pathological fracture, unspecified site, initial encounter for fracture    Allergy    Asthma    Barrett's esophagus with low grade dysplasia    Cataract 2016   They were removed   Chronic obstructive pulmonary disease (COPD) (HCC)    GERD (gastroesophageal reflux disease)    Major depressive disorder, recurrent severe without psychotic features (HCC)    Mixed hyperlipidemia    Obstructive sleep apnea (adult) (pediatric)    Other obesity due to excess calories    Other pulmonary embolism without acute cor pulmonale (HCC)    Other specified anxiety disorders    Oxygen  deficiency 2016   Sleep related leg cramps    Telogen effluvium    Urticaria     Past Surgical History:  Procedure Laterality Date   ABDOMINAL HYSTERECTOMY  1980   partial   BREAST BIOPSY Left 02/12/2022   US  LT BREAST BX W LOC DEV 1ST LESION IMG BX SPEC US  GUIDE 02/12/2022 [facility]   CESAREAN SECTION  06/24/1978   EYE SURGERY     Cataracts   right wrist 1985     SPINE SURGERY     Had lower back, herinated disk    Family History  Problem  Relation Age of Onset   Obesity Mother    Varicose Veins Mother    Heart attack Father    Heart disease Father    Allergic rhinitis Sister    Thyroid  cancer Daughter    Asthma Brother    Renal Disease Brother    Kidney disease Brother    Breast cancer Neg Hx     Social History   Socioeconomic History   Marital status: Married    Spouse name: Francis   Number of children: 2   Years of education: Not on file   Highest education level: Bachelor's degree (e.g., BA, AB, BS)  Occupational History   Not on file  Tobacco Use   Smoking status: Former    Current packs/day: 0.00    Average packs/day: 0.5 packs/day for 55.0 years (27.5 ttl pk-yrs)    Types: Cigarettes, E-cigarettes    Start date: 1964    Quit date: 2019    Years since quitting: 6.9   Smokeless tobacco: Never   Tobacco comments:    Patient used to smoke up to 2 PPD.  Substance and Sexual Activity   Alcohol use: Yes    Alcohol/week: 2.0 standard drinks of alcohol    Types: 1 Glasses of wine, 1 Cans of beer per week    Comment: When we have pizza or  gi out for nice dinner, never together   Drug use: Never   Sexual activity: Not Currently    Birth control/protection: Other-see comments, None    Comment: I am 19,  Other Topics Concern   Not on file  Social History Narrative   Daughter - lives in [place], [place] - lives out of state - both are very successful.   Social Drivers of Health   Tobacco Use: Medium Risk (02/27/2024)   Patient History    Smoking Tobacco Use: Former    Smokeless Tobacco Use: Never    Passive Exposure: Not on file  Financial Resource Strain: Medium Risk (01/27/2024)   Overall Financial Resource Strain (CARDIA)    Difficulty of Paying Living Expenses: Somewhat hard  Food Insecurity: No Food Insecurity (01/27/2024)   Epic    Worried About Programme Researcher, Broadcasting/film/video in the Last Year: Never true    Ran Out of Food in the Last Year: Never true  Transportation Needs: No Transportation Needs  (01/27/2024)   Epic    Lack of Transportation (Medical): No    Lack of Transportation (Non-Medical): No  Physical Activity: Insufficiently Active (01/27/2024)   Exercise Vital Sign    Days of Exercise per Week: 4 days    Minutes of Exercise per Session: 10 min  Stress: Stress Concern Present (01/27/2024)   [employer] of Occupational Health - Occupational Stress Questionnaire    Feeling of Stress: To some extent  Social Connections: Moderately Isolated (01/27/2024)   Social Connection and Isolation Panel    Frequency of Communication with Friends and Family: More than three times a week    Frequency of Social Gatherings with Friends and Family: Once a week    Attends Religious Services: Never    Database Administrator or Organizations: No    Attends Banker Meetings: Never    Marital Status: Married  Catering Manager Violence: Not At Risk (01/27/2024)   Epic    Fear of Current or Ex-Partner: No    Emotionally Abused: No    Physically Abused: No    Sexually Abused: No  Depression (PHQ2-9): Low Risk (02/04/2024)   Depression (PHQ2-9)    PHQ-2 Score: 0  Alcohol Screen: Low Risk (01/27/2024)   Alcohol Screen    Last Alcohol Screening Score (AUDIT): 1  Housing: Unknown (01/27/2024)   Epic    Unable to Pay for Housing in the Last Year: No    Number of Times Moved in the Last Year: Not on file    Homeless in the Last Year: No  Utilities: Not At Risk (01/27/2024)   Epic    Threatened with loss of utilities: No  Health Literacy: Adequate Health Literacy (01/27/2024)   B1300 Health Literacy    Frequency of need for help with medical instructions: Never    Outpatient Medications Prior to Visit  Medication Sig Dispense Refill   albuterol  (VENTOLIN  HFA) 108 (90 Base) MCG/ACT inhaler Inhale 2 puffs into the lungs every 6 (six) hours as needed for wheezing or shortness of breath. 24 g 3   atorvastatin  (LIPITOR) 10 MG tablet TAKE 1 TABLET BY MOUTH EVERY DAY 90 tablet  1   calcium  carbonate (OSCAL) 1500 (600 Ca) MG TABS tablet Take 600 mg of elemental calcium  by mouth daily.     clonazePAM  (KLONOPIN ) 1 MG tablet Take 1 tablet (1 mg total) by mouth daily as needed for anxiety. 30 tablet 1   cyclobenzaprine  (FLEXERIL ) 10 MG tablet TAKE 1 TABLET BY  MOUTH EVERYDAY AT BEDTIME 90 tablet 1   famotidine  (PEPCID ) 40 MG tablet TAKE 1 TABLET BY MOUTH EVERYDAY AT BEDTIME 90 tablet 2   feeding supplement (BOOST HIGH PROTEIN) LIQD Take 1 Container by mouth in the morning and at bedtime.     furosemide  (LASIX ) 20 MG tablet TAKE 1 TABLET (20 MG TOTAL) BY MOUTH DAILY AS NEEDED FOR SWELLING 90 tablet 0   oxyCODONE-acetaminophen (PERCOCET) 10-325 MG tablet Take 1 tablet by mouth every 6 (six) hours.     OXYGEN  Inhale 3 L into the lungs. 2 L at night.     pramipexole  (MIRAPEX ) 1 MG tablet TAKE 1 TABLET BY MOUTH EVERYDAY AT BEDTIME 90 tablet 1   promethazine  (PHENERGAN ) 25 MG tablet Take 1 tablet (25 mg total) by mouth every 6 (six) hours as needed for nausea or vomiting. 30 tablet 1   sertraline  (ZOLOFT ) 100 MG tablet TAKE 2 TABLETS BY MOUTH EVERY DAY 180 tablet 1   VITAMIN D -VITAMIN K PO Take 1 tablet by mouth daily.     amoxicillin -clavulanate (AUGMENTIN ) 875-125 MG tablet Take 1 tablet by mouth 2 (two) times daily. 20 tablet 0   Facility-Administered Medications Prior to Visit  Medication Dose Route Frequency Provider Last Rate Last Admin   denosumab  (PROLIA ) injection 60 mg  60 mg Subcutaneous Q6 months Keishana Klinger, MD   60 mg at 11/30/22 0941    Allergies:   Sulfamethoxazole, Aspirin, and Sulfa antibiotics   Social History[1]   Review of Systems  Constitutional:  Positive for fever. Negative for chills.  HENT:  Negative for congestion, ear pain and sore throat.   Respiratory:  Positive for cough and shortness of breath.   Cardiovascular:  Negative for chest pain.     Labs/Other Tests and Data Reviewed:    Recent Labs: 10/19/2023: TSH 0.940 01/31/2024: ALT 17;  BUN 10; Creatinine, Ser 0.70; Hemoglobin 13.4; Platelets 216; Potassium 4.7; Sodium 139   Recent Lipid Panel Lab Results  Component Value Date/Time   CHOL 180 10/19/2023 08:29 AM   TRIG 56 10/19/2023 08:29 AM   HDL 102 10/19/2023 08:29 AM   CHOLHDL 1.8 10/19/2023 08:29 AM   LDLCALC 67 10/19/2023 08:29 AM    Wt Readings from Last 3 Encounters:  02/27/24 103 lb (46.7 kg)  02/04/24 103 lb 6.4 oz (46.9 kg)  01/27/24 102 lb (46.3 kg)     Objective:    Vital Signs:  Ht 5' 3 (1.6 m)   Wt 103 lb (46.7 kg)   LMP  (LMP Unknown)   BMI 18.25 kg/m    Physical Exam  No respiratory distress on phone, but is coughing.   ASSESSMENT & PLAN:    Assessment & Plan COPD with acute exacerbation (HCC) - prednisone , tussionex, and augmenting.  - recommend albuterol  2 puffs four times a day x 48 hours then four times a day as needed.  If worsening, go to ED.   Orders:   predniSONE  (DELTASONE ) 50 MG tablet; Take 1 tablet (50 mg total) by mouth daily with breakfast.   chlorpheniramine-HYDROcodone  (TUSSIONEX) 10-8 MG/5ML; Take 5 mLs by mouth every 12 (twelve) hours as needed for cough.   amoxicillin -clavulanate (AUGMENTIN ) 875-125 MG tablet; Take 1 tablet by mouth 2 (two) times daily.       Meds ordered this encounter  Medications   predniSONE  (DELTASONE ) 50 MG tablet    Sig: Take 1 tablet (50 mg total) by mouth daily with breakfast.    Dispense:  5 tablet  Refill:  0   chlorpheniramine-HYDROcodone  (TUSSIONEX) 10-8 MG/5ML    Sig: Take 5 mLs by mouth every 12 (twelve) hours as needed for cough.    Dispense:  115 mL    Refill:  0    J44.1   amoxicillin -clavulanate (AUGMENTIN ) 875-125 MG tablet    Sig: Take 1 tablet by mouth 2 (two) times daily.    Dispense:  20 tablet    Refill:  0    Follow Up:  In Person in 3 day(s)  Signed,  Abigail Free, MD  02/27/2024 1:24 PM    [facility] [place]      [1]  Social History Tobacco Use   Smoking status: Former    Current  packs/day: 0.00    Average packs/day: 0.5 packs/day for 55.0 years (27.5 ttl pk-yrs)    Types: Cigarettes, E-cigarettes    Start date: 1964    Quit date: 2019    Years since quitting: 6.9   Smokeless tobacco: Never   Tobacco comments:    Patient used to smoke up to 2 PPD.  Substance Use Topics   Alcohol use: Yes    Alcohol/week: 2.0 standard drinks of alcohol    Types: 1 Glasses of wine, 1 Cans of beer per week    Comment: When we have pizza or gi out for nice dinner, never together   Drug use: Never

## 2024-02-27 ENCOUNTER — Encounter: Payer: Self-pay | Admitting: Family Medicine

## 2024-02-27 NOTE — Addendum Note (Signed)
 Addended byBETHA SHERRE CLAPPER on: 02/27/2024 01:25 PM   Modules accepted: Level of Service

## 2024-02-28 ENCOUNTER — Ambulatory Visit: Admitting: Family Medicine

## 2024-02-28 ENCOUNTER — Encounter: Payer: Self-pay | Admitting: Family Medicine

## 2024-02-28 ENCOUNTER — Telehealth: Admitting: Family Medicine

## 2024-02-28 VITALS — BP 110/68 | HR 82 | Temp 98.1°F | Ht 63.0 in | Wt 108.0 lb

## 2024-02-28 DIAGNOSIS — J441 Chronic obstructive pulmonary disease with (acute) exacerbation: Secondary | ICD-10-CM | POA: Diagnosis not present

## 2024-02-28 DIAGNOSIS — J410 Simple chronic bronchitis: Secondary | ICD-10-CM

## 2024-02-28 LAB — POCT RESPIRATORY SYNCYTIAL VIRUS: RSV Rapid Ag: NEGATIVE

## 2024-02-28 LAB — POCT INFLUENZA A/B
Influenza A, POC: NEGATIVE
Influenza B, POC: NEGATIVE

## 2024-02-28 LAB — POC COVID19 BINAXNOW: SARS Coronavirus 2 Ag: NEGATIVE

## 2024-02-28 MED ORDER — BUDESONIDE-FORMOTEROL FUMARATE 80-4.5 MCG/ACT IN AERO: 2.0000 | INHALATION_SPRAY | Freq: Two times a day (BID) | RESPIRATORY_TRACT | 3 refills | Status: DC

## 2024-02-28 MED ORDER — BUDESONIDE-FORMOTEROL FUMARATE 160-4.5 MCG/ACT IN AERO: 2.0000 | INHALATION_SPRAY | Freq: Two times a day (BID) | RESPIRATORY_TRACT | 3 refills | Status: AC

## 2024-02-28 MED ORDER — CEFTRIAXONE SODIUM 1 G IJ SOLR
1.0000 g | Freq: Once | INTRAMUSCULAR | Status: AC
  Administered 2024-02-28: 11:00:00 1 g via INTRAMUSCULAR

## 2024-02-28 NOTE — Patient Instructions (Signed)
°  VISIT SUMMARY: You visited us  today due to a persistent cough and fatigue. We discussed your COPD management and explored alternative inhaler options due to cost concerns. We also addressed your headache and potential respiratory infection.  YOUR PLAN: ACUTE UPPER RESPIRATORY INFECTION: You have a persistent cough and headache that may be due to a respiratory infection like COVID-19, influenza, or RSV. -We have ordered tests for COVID-19, influenza, and RSV to determine the cause of your symptoms.  CHRONIC OBSTRUCTIVE PULMONARY DISEASE (COPD): Your COPD symptoms are not well-controlled due to inconsistent use of inhalers, partly because of cost concerns. -We have prescribed Advair  as a more affordable alternative to Trelegy. -Please use your inhalers consistently, including both albuterol  and Advair , to manage your symptoms effectively.                      Contains text generated by [employer].                                 Contains text generated by [employer]bridge.

## 2024-02-28 NOTE — Progress Notes (Unsigned)
 Acute Office Visit  Subjective:    Patient ID: Ellen Holloway, female    DOB: [birth year], 73 y.o.   MRN: [id number]  Chief Complaint  Patient presents with   Cough    fatigue     Discussed the use of AI scribe software for clinical note transcription with the patient, who gave verbal consent to proceed.  History of Present Illness Ellen Holloway is a 73 year old female with COPD who presents with persistent cough and fatigue despite starting on prednisone , augmentin , and tussionex cough syrup. Symptoms began one week ago.   Cough - Persistent cough ongoing for several days - Described as making her sound like 'one of my dogs' - Uses albuterol  inhaler 2-3 times daily with some relief - No use of other inhalers for some time due to cost - Previously found Trelegy helpful when samples were available - sees pulmonologist (Dr. Mardee.)  Fatigue - Ongoing fatigue attributed to increased activity with holiday preparations - No significant body aches - No fevers  Headache - Headache located in the center of the head - Present since Friday  Chills and temperature sensation - No chills - Feels cold, attributed to weather  Medication adherence and access - Currently taking prednisone  and albuterol  inhalers - Not on chronic inhaler therapy due to cost concerns - Previously used Trelegy with benefit when samples were available    Past Medical History:  Diagnosis Date   Acquired absence of both cervix and uterus    Age-related osteoporosis with current pathological fracture, unspecified site, initial encounter for fracture    Allergy    Asthma    Barrett's esophagus with low grade dysplasia    Cataract 2016   They were removed   Chronic obstructive pulmonary disease (COPD) (HCC)    GERD (gastroesophageal reflux disease)    Major depressive disorder, recurrent severe without psychotic features (HCC)    Mixed hyperlipidemia    Obstructive sleep apnea (adult) (pediatric)     Other obesity due to excess calories    Other pulmonary embolism without acute cor pulmonale (HCC)    Other specified anxiety disorders    Oxygen  deficiency 2016   Sleep related leg cramps    Telogen effluvium    Urticaria     Past Surgical History:  Procedure Laterality Date   ABDOMINAL HYSTERECTOMY  1980   partial   BREAST BIOPSY Left 02/12/2022   US  LT BREAST BX W LOC DEV 1ST LESION IMG BX SPEC US  GUIDE 02/12/2022 [facility]   CESAREAN SECTION  06/24/1978   EYE SURGERY     Cataracts   right wrist 1985     SPINE SURGERY     Had lower back, herinated disk    Family History  Problem Relation Age of Onset   Obesity Mother    Varicose Veins Mother    Heart attack Father    Heart disease Father    Allergic rhinitis Sister    Thyroid  cancer Daughter    Asthma Brother    Renal Disease Brother    Kidney disease Brother    Breast cancer Neg Hx     Social History   Socioeconomic History   Marital status: Married    Spouse name: Francis   Number of children: 2   Years of education: Not on file   Highest education level: Bachelor's degree (e.g., BA, AB, BS)  Occupational History   Not on file  Tobacco Use   Smoking status: Former  Current packs/day: 0.00    Average packs/day: 0.5 packs/day for 55.0 years (27.5 ttl pk-yrs)    Types: Cigarettes, E-cigarettes    Start date: 1964    Quit date: 2019    Years since quitting: 6.9   Smokeless tobacco: Never   Tobacco comments:    Patient used to smoke up to 2 PPD.  Substance and Sexual Activity   Alcohol use: Yes    Alcohol/week: 2.0 standard drinks of alcohol    Types: 1 Glasses of wine, 1 Cans of beer per week    Comment: When we have pizza or gi out for nice dinner, never together   Drug use: Never   Sexual activity: Not Currently    Birth control/protection: Other-see comments, None    Comment: I am 51,  Other Topics Concern   Not on file  Social History Narrative   Daughter - lives in [place],  [place] - lives out of state - both are very successful.   Social Drivers of Health   Tobacco Use: Medium Risk (02/27/2024)   Patient History    Smoking Tobacco Use: Former    Smokeless Tobacco Use: Never    Passive Exposure: Not on file  Financial Resource Strain: Medium Risk (01/27/2024)   Overall Financial Resource Strain (CARDIA)    Difficulty of Paying Living Expenses: Somewhat hard  Food Insecurity: No Food Insecurity (01/27/2024)   Epic    Worried About Programme Researcher, Broadcasting/film/video in the Last Year: Never true    Ran Out of Food in the Last Year: Never true  Transportation Needs: No Transportation Needs (01/27/2024)   Epic    Lack of Transportation (Medical): No    Lack of Transportation (Non-Medical): No  Physical Activity: Insufficiently Active (01/27/2024)   Exercise Vital Sign    Days of Exercise per Week: 4 days    Minutes of Exercise per Session: 10 min  Stress: Stress Concern Present (01/27/2024)   [employer] of Occupational Health - Occupational Stress Questionnaire    Feeling of Stress: To some extent  Social Connections: Moderately Isolated (01/27/2024)   Social Connection and Isolation Panel    Frequency of Communication with Friends and Family: More than three times a week    Frequency of Social Gatherings with Friends and Family: Once a week    Attends Religious Services: Never    Database Administrator or Organizations: No    Attends Banker Meetings: Never    Marital Status: Married  Catering Manager Violence: Not At Risk (01/27/2024)   Epic    Fear of Current or Ex-Partner: No    Emotionally Abused: No    Physically Abused: No    Sexually Abused: No  Depression (PHQ2-9): Low Risk (02/04/2024)   Depression (PHQ2-9)    PHQ-2 Score: 0  Alcohol Screen: Low Risk (01/27/2024)   Alcohol Screen    Last Alcohol Screening Score (AUDIT): 1  Housing: Unknown (01/27/2024)   Epic    Unable to Pay for Housing in the Last Year: No    Number of Times  Moved in the Last Year: Not on file    Homeless in the Last Year: No  Utilities: Not At Risk (01/27/2024)   Epic    Threatened with loss of utilities: No  Health Literacy: Adequate Health Literacy (01/27/2024)   B1300 Health Literacy    Frequency of need for help with medical instructions: Never    Outpatient Medications Prior to Visit  Medication Sig Dispense Refill  albuterol  (VENTOLIN  HFA) 108 (90 Base) MCG/ACT inhaler Inhale 2 puffs into the lungs every 6 (six) hours as needed for wheezing or shortness of breath. 24 g 3   amoxicillin -clavulanate (AUGMENTIN ) 875-125 MG tablet Take 1 tablet by mouth 2 (two) times daily. 20 tablet 0   atorvastatin  (LIPITOR) 10 MG tablet TAKE 1 TABLET BY MOUTH EVERY DAY 90 tablet 1   calcium  carbonate (OSCAL) 1500 (600 Ca) MG TABS tablet Take 600 mg of elemental calcium  by mouth daily.     chlorpheniramine-HYDROcodone  (TUSSIONEX) 10-8 MG/5ML Take 5 mLs by mouth every 12 (twelve) hours as needed for cough. 115 mL 0   clonazePAM  (KLONOPIN ) 1 MG tablet Take 1 tablet (1 mg total) by mouth daily as needed for anxiety. 30 tablet 1   cyclobenzaprine  (FLEXERIL ) 10 MG tablet TAKE 1 TABLET BY MOUTH EVERYDAY AT BEDTIME 90 tablet 1   famotidine  (PEPCID ) 40 MG tablet TAKE 1 TABLET BY MOUTH EVERYDAY AT BEDTIME 90 tablet 2   feeding supplement (BOOST HIGH PROTEIN) LIQD Take 1 Container by mouth in the morning and at bedtime.     furosemide  (LASIX ) 20 MG tablet TAKE 1 TABLET (20 MG TOTAL) BY MOUTH DAILY AS NEEDED FOR SWELLING 90 tablet 0   oxyCODONE-acetaminophen (PERCOCET) 10-325 MG tablet Take 1 tablet by mouth every 6 (six) hours.     OXYGEN  Inhale 3 L into the lungs. 2 L at night.     pramipexole  (MIRAPEX ) 1 MG tablet TAKE 1 TABLET BY MOUTH EVERYDAY AT BEDTIME 90 tablet 1   predniSONE  (DELTASONE ) 50 MG tablet Take 1 tablet (50 mg total) by mouth daily with breakfast. 5 tablet 0   promethazine  (PHENERGAN ) 25 MG tablet Take 1 tablet (25 mg total) by mouth every 6 (six)  hours as needed for nausea or vomiting. 30 tablet 1   sertraline  (ZOLOFT ) 100 MG tablet TAKE 2 TABLETS BY MOUTH EVERY DAY 180 tablet 1   VITAMIN D -VITAMIN K PO Take 1 tablet by mouth daily.     Facility-Administered Medications Prior to Visit  Medication Dose Route Frequency Provider Last Rate Last Admin   denosumab  (PROLIA ) injection 60 mg  60 mg Subcutaneous Q6 months Erma Raiche, MD   60 mg at 11/30/22 0941    Allergies[1]  Review of Systems     Objective:        02/28/2024    9:54 AM 02/27/2024   12:27 AM 02/04/2024    8:09 AM  Vitals with BMI  Height 5' 3 5' 3 5' 3  Weight 108 lbs 103 lbs 103 lbs 6 oz  BMI 19.14 18.25 18.32  Systolic 110  118  Diastolic 68  68  Pulse 82  67    No data found.   Physical Exam Vitals reviewed.  Constitutional:      Appearance: She is ill-appearing.  HENT:     Right Ear: Tympanic membrane normal. There is impacted cerumen (partial).     Left Ear: Tympanic membrane normal. There is impacted cerumen (partial).     Nose: Nose normal.     Mouth/Throat:     Pharynx: Oropharynx is clear. Posterior oropharyngeal erythema (mild) present. No oropharyngeal exudate.  Cardiovascular:     Rate and Rhythm: Normal rate and regular rhythm.     Heart sounds: Normal heart sounds. No murmur heard. Pulmonary:     Effort: Pulmonary effort is normal. No respiratory distress.     Breath sounds: Wheezing (BL bases) present.  Lymphadenopathy:     Cervical:  No cervical adenopathy.  Neurological:     Mental Status: She is alert and oriented to person, place, and time.  Psychiatric:        Mood and Affect: Mood normal.        Behavior: Behavior normal.     Health Maintenance Due  Topic Date Due   COVID-19 Vaccine (7 - 2025-26 season) 11/15/2023   Fecal DNA (Cologuard)  01/30/2024   Mammogram  02/09/2024    There are no preventive care reminders to display for this patient.   Lab Results  Component Value Date   TSH 0.940 10/19/2023    Lab Results  Component Value Date   WBC 8.3 01/31/2024   HGB 13.4 01/31/2024   HCT 40.1 01/31/2024   MCV 97 01/31/2024   PLT 216 01/31/2024   Lab Results  Component Value Date   NA 139 01/31/2024   K 4.7 01/31/2024   CO2 26 01/31/2024   GLUCOSE 99 01/31/2024   BUN 10 01/31/2024   CREATININE 0.70 01/31/2024   BILITOT 0.4 01/31/2024   ALKPHOS 59 01/31/2024   AST 24 01/31/2024   ALT 17 01/31/2024   PROT 6.4 01/31/2024   ALBUMIN 4.2 01/31/2024   CALCIUM  9.5 01/31/2024   EGFR 91 01/31/2024   Lab Results  Component Value Date   CHOL 180 10/19/2023   Lab Results  Component Value Date   HDL 102 10/19/2023   Lab Results  Component Value Date   LDLCALC 67 10/19/2023   Lab Results  Component Value Date   TRIG 56 10/19/2023   Lab Results  Component Value Date   CHOLHDL 1.8 10/19/2023   No results found for: HGBA1C      Results for orders placed or performed in visit on 02/04/24  POCT URINALYSIS DIP (CLINITEK)   Collection Time: 02/04/24  8:19 AM  Result Value Ref Range   Color, UA yellow yellow   Clarity, UA clear clear   Glucose, UA negative negative mg/dL   Bilirubin, UA negative negative   Ketones, POC UA negative negative mg/dL   Spec Grav, UA 8.989 8.989 - 1.025   Blood, UA negative negative   pH, UA 6.0 5.0 - 8.0   POC PROTEIN,UA negative negative, trace   Urobilinogen, UA 0.2 0.2 or 1.0 E.U./dL   Nitrite, UA Negative Negative   Leukocytes, UA Negative Negative  Urine Culture   Collection Time: 02/04/24 10:42 AM   Specimen: Urine   UR  Result Value Ref Range   Urine Culture, Routine Final report    Organism ID, Bacteria Comment      Assessment & Plan:   Assessment & Plan COPD with acute exacerbation (HCC) Rocephin  shot given. Continue augmentin .  Use albuterol  neb or hfa four times a day until improved and then four times a day as needed.  Start on trelegy one inhalation daily. Sample given.  Symbicort  sent to pharmacy as this is on her  formulary and hopefully the cost will be affordable.  Start symbicort  after completion of trelegy.   Orders:   POCT Influenza A/B   POC COVID-19 BinaxNow   POCT respiratory syncytial virus   cefTRIAXone  (ROCEPHIN ) injection 1 g  Simple chronic bronchitis (HCC) Patient is not well controlled at baseline. Needs maintenance medicine.  Start on trelegy one inhalation daily. Sample given.  Symbicort  sent to pharmacy as this is on her formulary and hopefully the cost will be affordable.  Start symbicort  after completion of trelegy.  Body mass index is 19.13 kg/m.SABRA   No orders of the defined types were placed in this encounter.   No orders of the defined types were placed in this encounter.    Follow-up: No follow-ups on file.  An After Visit Summary was printed and given to the patient.  Abigail Free, MD [facility] [phone]     [1]  Allergies Allergen Reactions   Sulfamethoxazole Anaphylaxis   Aspirin Other (See Comments)   Sulfa Antibiotics Hives

## 2024-02-28 NOTE — Assessment & Plan Note (Signed)
 Patient is not well controlled at baseline. Needs maintenance medicine.  Start on trelegy one inhalation daily. Sample given.  Symbicort  sent to pharmacy as this is on her formulary and hopefully the cost will be affordable.  Start symbicort  after completion of trelegy.

## 2024-03-13 ENCOUNTER — Ambulatory Visit
Admission: RE | Admit: 2024-03-13 | Discharge: 2024-03-13 | Disposition: A | Source: Ambulatory Visit | Attending: Family Medicine | Admitting: Family Medicine

## 2024-03-13 DIAGNOSIS — Z1231 Encounter for screening mammogram for malignant neoplasm of breast: Secondary | ICD-10-CM

## 2024-03-15 ENCOUNTER — Encounter: Payer: Self-pay | Admitting: Family Medicine

## 2024-03-31 ENCOUNTER — Other Ambulatory Visit: Payer: Self-pay | Admitting: Family Medicine

## 2024-05-09 ENCOUNTER — Ambulatory Visit: Admitting: Family Medicine

## 2025-01-31 ENCOUNTER — Ambulatory Visit
